# Patient Record
Sex: Female | Born: 1970 | Race: Black or African American | Hispanic: No | Smoking: Never smoker
Health system: Southern US, Community
[De-identification: ages and names within clinical notes are randomized; demographics above are authoritative.]

## PROBLEM LIST (undated history)

## (undated) DIAGNOSIS — T7840XA Allergy, unspecified, initial encounter: Secondary | ICD-10-CM

## (undated) DIAGNOSIS — B009 Herpesviral infection, unspecified: Secondary | ICD-10-CM

## (undated) DIAGNOSIS — I10 Essential (primary) hypertension: Secondary | ICD-10-CM

## (undated) DIAGNOSIS — E7800 Pure hypercholesterolemia, unspecified: Secondary | ICD-10-CM

## (undated) DIAGNOSIS — E669 Obesity, unspecified: Secondary | ICD-10-CM

## (undated) HISTORY — DX: Obesity, unspecified: E66.9

## (undated) HISTORY — PX: WISDOM TOOTH EXTRACTION: SHX21

## (undated) HISTORY — DX: Allergy, unspecified, initial encounter: T78.40XA

## (undated) HISTORY — DX: Herpesviral infection, unspecified: B00.9

## (undated) HISTORY — DX: Pure hypercholesterolemia, unspecified: E78.00

---

## 1990-06-22 DIAGNOSIS — B009 Herpesviral infection, unspecified: Secondary | ICD-10-CM

## 1990-06-22 HISTORY — DX: Herpesviral infection, unspecified: B00.9

## 2006-01-18 ENCOUNTER — Emergency Department: Payer: Self-pay | Admitting: Emergency Medicine

## 2015-06-04 LAB — HM PAP SMEAR: HM PAP: NORMAL

## 2015-06-04 LAB — HM MAMMOGRAPHY

## 2015-06-24 ENCOUNTER — Ambulatory Visit (INDEPENDENT_AMBULATORY_CARE_PROVIDER_SITE_OTHER): Payer: Self-pay | Admitting: Family Medicine

## 2015-06-24 ENCOUNTER — Encounter: Payer: Self-pay | Admitting: Family Medicine

## 2015-06-24 VITALS — BP 122/78 | HR 59 | Temp 98.4°F | Resp 16 | Ht 62.0 in | Wt 197.0 lb

## 2015-06-24 DIAGNOSIS — J301 Allergic rhinitis due to pollen: Secondary | ICD-10-CM

## 2015-06-24 DIAGNOSIS — H66001 Acute suppurative otitis media without spontaneous rupture of ear drum, right ear: Secondary | ICD-10-CM

## 2015-06-24 DIAGNOSIS — H6690 Otitis media, unspecified, unspecified ear: Secondary | ICD-10-CM | POA: Insufficient documentation

## 2015-06-24 DIAGNOSIS — J309 Allergic rhinitis, unspecified: Secondary | ICD-10-CM | POA: Insufficient documentation

## 2015-06-24 MED ORDER — AMOXICILLIN 500 MG PO CAPS: 500.0000 mg | ORAL_CAPSULE | Freq: Two times a day (BID) | ORAL | Status: DC

## 2015-06-24 MED ORDER — FLUTICASONE PROPIONATE 50 MCG/ACT NA SUSP: 2.0000 | Freq: Every day | NASAL | Status: DC

## 2015-06-24 NOTE — Progress Notes (Signed)
Subjective:    Patient ID: Cassidy Kelly, female    DOB: 21-Jul-1970, 45 y.o.   MRN: [id number]  HPI: Cassidy Kelly is a 45 y.o. female presenting on 06/24/2015 for Ear Pain   HPI  Pt presents to establish care today but for Sick visit. Previous care provider was [facility]- Dr. Elease Hashimoto.  It has been 5 years since Her last PCP visit. Records from previous provider will be requested and reviewed. She is a Pension scheme manager, working at an [employer] school currently. Current medical problems include:  Sees GYN- [facility] for well woman exams.   Patient presents with right ear pain. Two weeks ago, she felt a knot behind her right ear. She did not check it again until yesterday, when she noticed it was gone. Two weeks ago, she was also exposed to a cat, which she is allergic to. She had swollen, itchy eyes and congestion that lasted two days. She did not have rhinorrhea, facial pain, sore throat, cough, SOB, or chest tightness. She used lavender, lemon, and peppermint oils to help her symptoms. She did not try any OTC products.  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 Nothing seems to trigger it. No history of trauma. It goes away with popping. No hearing loss. Two days ago, she felt warm and had a t-max of 100.2. She noticed mild right ear pain. No chills, nausea, or vomiting.   Health maintenance:  Flu - declines Sees GYN- [facility]- Paps and mammogram UTD- done last year. Currently using depo for birth control.   Past Medical History  Diagnosis Date  . Allergy    Social History   Social History  . Marital Status: Married    Spouse Name: N/A  . Number of Children: N/A  . Years of Education: N/A   Occupational History  . Not on file.   Social History Main Topics  . Smoking status: Never Smoker   . Smokeless tobacco: Not on file  . Alcohol Use: Not on file  . Drug Use: Not on file  . Sexual Activity: Not on file     Other Topics Concern  . Not on file   Social History Narrative  . No narrative on file     No current outpatient prescriptions on file prior to visit.   No current facility-administered medications on file prior to visit.    Review of Systems  Constitutional: Positive for fever. Negative for activity change.  HENT: Positive for congestion, ear pain, sneezing and tinnitus. Negative for ear discharge, hearing loss, postnasal drip, rhinorrhea, sinus pressure and sore throat.   Eyes: Positive for itching. Negative for visual disturbance.  Respiratory: Negative for cough, chest tightness, shortness of breath and wheezing.   Cardiovascular: Negative for chest pain.  Gastrointestinal: Negative for nausea and vomiting.  Genitourinary: Negative for dysuria.  Musculoskeletal: Negative for myalgias and arthralgias.  Skin: Negative for color change.  Allergic/Immunologic: Positive for environmental allergies.  Neurological: Negative for dizziness, weakness and light-headedness.  Hematological: Positive for adenopathy.  Psychiatric/Behavioral: Negative for behavioral problems and agitation.   Per HPI unless specifically indicated above     Objective:    BP 122/78 mmHg  Pulse 59  Temp(Src) 98.4 F (36.9 C) (Oral)  Resp 16  Ht  (1.575 m)  Wt 197 lb (89.359 kg)  BMI 36.02 kg/m2  SpO2 100%  Wt Readings from Last 3 Encounters:  06/24/15 197 lb (89.359 kg)  Physical Exam  Constitutional: She is oriented to person, place, and time. She appears well-developed and well-nourished.  HENT:  Head: Normocephalic and atraumatic.  Right Ear: Hearing normal. No lacerations. There is swelling. No drainage or tenderness. No foreign bodies. Tympanic membrane is not erythematous and not bulging. A middle ear effusion is present. No decreased hearing is noted.  Left Ear: Hearing normal. No lacerations. No drainage, swelling or tenderness. No foreign bodies.  No middle ear effusion. No  decreased hearing is noted.  Nose: Mucosal edema present. Right sinus exhibits no maxillary sinus tenderness and no frontal sinus tenderness. Left sinus exhibits no maxillary sinus tenderness and no frontal sinus tenderness.  Mouth/Throat: Oropharynx is clear and moist. No oropharyngeal exudate, posterior oropharyngeal edema or posterior oropharyngeal erythema.  Weber test showed no lateralization. Rinne test showed air conduction < bone conduction bilaterally.  Dull opaque TM with cloudy fluid. Poor landmarks.    Eyes: EOM are normal.  Neck: Normal range of motion.  Cardiovascular: Normal rate, regular rhythm, normal heart sounds and normal pulses.   Pulmonary/Chest: Effort normal and breath sounds normal. No respiratory distress. She has no wheezes.  Musculoskeletal: Normal range of motion.  Lymphadenopathy:       Head (right side): No submental, no submandibular, no tonsillar, no preauricular, no posterior auricular and no occipital adenopathy present.       Head (left side): No submental, no submandibular, no tonsillar, no preauricular, no posterior auricular and no occipital adenopathy present.    She has no cervical adenopathy.       Right: No supraclavicular adenopathy present.       Left: No supraclavicular adenopathy present.  Neurological: She is alert and oriented to person, place, and time.  Skin: Skin is warm and dry.  Psychiatric: She has a normal mood and affect. Her speech is normal and behavior is normal. Thought content normal.   No results found for this or any previous visit.    Assessment & Plan:   Problem List Items Addressed This Visit      Respiratory   Allergic rhinitis    Pt would prefer to treat with essential oils. However will try shorterm flonase to help with current nasal symptoms and ear congestion. Encouraged use of complementary therapies with nasal steroids.        Relevant Medications   fluticasone (FLONASE) 50 MCG/ACT nasal spray     Nervous  and Auditory   AOM (acute otitis media) - Primary    Treat for AOM due to dull opaque membrane and fever. Amoxil BID for 5 days. Return if symptoms not improving.       Relevant Medications   amoxicillin (AMOXIL) 500 MG capsule      Meds ordered this encounter  Medications  . medroxyPROGESTERone (DEPO-PROVERA) 150 MG/ML injection    Sig: INJECT 1 MILLILITER INTRAMUSCULARLY EVERY 3 MONTHS as directed by prescriber    Refill:  0  . amoxicillin (AMOXIL) 500 MG capsule    Sig: Take 1 capsule (500 mg total) by mouth 2 (two) times daily.    Dispense:  10 capsule    Refill:  0    Order Specific Question:  Supervising Provider    Answer:  Janeann Forehand [phone]  . fluticasone (FLONASE) 50 MCG/ACT nasal spray    Sig: Place 2 sprays into both nostrils daily.    Dispense:  16 g    Refill:  3    Order Specific Question:  Supervising Provider  Answer:  Janeann ForehandHAWKINS JR, JAMES H [[id number]]      Follow up plan: Return in about 4 weeks (around 07/22/2015) for Preventative health exam.

## 2015-06-24 NOTE — Patient Instructions (Signed)

## 2015-06-24 NOTE — Assessment & Plan Note (Signed)
Pt would prefer to treat with essential oils. However will try shorterm flonase to help with current nasal symptoms and ear congestion. Encouraged use of complementary therapies with nasal steroids.

## 2015-06-24 NOTE — Assessment & Plan Note (Signed)
Treat for AOM due to dull opaque membrane and fever. Amoxil BID for 5 days. Return if symptoms not improving.

## 2015-10-11 ENCOUNTER — Ambulatory Visit (INDEPENDENT_AMBULATORY_CARE_PROVIDER_SITE_OTHER): Payer: BC Managed Care – PPO | Admitting: Family Medicine

## 2015-10-11 ENCOUNTER — Encounter: Payer: Self-pay | Admitting: Family Medicine

## 2015-10-11 VITALS — BP 124/76 | HR 66 | Temp 98.7°F | Resp 16 | Ht 62.0 in | Wt 204.0 lb

## 2015-10-11 DIAGNOSIS — Z0000 Encounter for general adult medical examination without abnormal findings: Secondary | ICD-10-CM | POA: Diagnosis not present

## 2015-10-11 DIAGNOSIS — Z6837 Body mass index (BMI) 37.0-37.9, adult: Secondary | ICD-10-CM | POA: Diagnosis not present

## 2015-10-11 LAB — COMPLETE METABOLIC PANEL WITH GFR
ALT: 16 U/L (ref 6–29)
AST: 17 U/L (ref 10–35)
Albumin: 4.5 g/dL (ref 3.6–5.1)
Alkaline Phosphatase: 68 U/L (ref 33–115)
BILIRUBIN TOTAL: 0.5 mg/dL (ref 0.2–1.2)
BUN: 7 mg/dL (ref 7–25)
CO2: 22 mmol/L (ref 20–31)
CREATININE: 0.94 mg/dL (ref 0.50–1.10)
Calcium: 8.9 mg/dL (ref 8.6–10.2)
Chloride: 104 mmol/L (ref 98–110)
GFR, EST AFRICAN AMERICAN: 84 mL/min (ref >=60)
GFR, Est Non African American: 73 mL/min (ref >=60)
GLUCOSE: 98 mg/dL (ref 65–99)
Potassium: 4 mmol/L (ref 3.5–5.3)
SODIUM: 139 mmol/L (ref 135–146)
TOTAL PROTEIN: 7.3 g/dL (ref 6.1–8.1)

## 2015-10-11 LAB — LIPID PANEL
Cholesterol: 222 mg/dL — ABNORMAL HIGH (ref 125–200)
HDL: 56 mg/dL (ref >=46)
LDL CALC: 129 mg/dL (ref <130)
Total CHOL/HDL Ratio: 4 Ratio (ref <=5.0)
Triglycerides: 184 mg/dL — ABNORMAL HIGH (ref <150)
VLDL: 37 mg/dL — AB (ref <30)

## 2015-10-11 NOTE — Progress Notes (Signed)
Subjective:    Patient ID: Cassidy Kelly, female    DOB: [birth year], 45 y.o.   MRN: [id number]  HPI: Cassidy Kelly is a 45 y.o. female presenting on 10/11/2015 for Annual Exam   HPI  Pt prevents for wellness visit. Sees OB yearly for mammograms and pap. Last pap March 2017. Normal. Mammogram normal.  TDAP: Unknown- probably in the last 10 years. Maybe 2013.  Is starting a exercise program. Walking now a few times a week. Adding ankles. Does work out Engineer, [employer]. Feels right now she is not eating like she should. Is trying to eat better. She and husband are doing a weight loss challenge.  Needs health maintenance labs.  Past Medical History  Diagnosis Date  . Allergy    Social History   Social History  . Marital Status: Married    Spouse Name: N/A  . Number of Children: N/A  . Years of Education: N/A   Occupational History  . Not on file.   Social History Main Topics  . Smoking status: Never Smoker   . Smokeless tobacco: Not on file  . Alcohol Use: Not on file  . Drug Use: Not on file  . Sexual Activity: Not on file   Other Topics Concern  . Not on file   Social History Narrative   Family History  Problem Relation Age of Onset  . Healthy Mother    Current Outpatient Prescriptions on File Prior to Visit  Medication Sig  . medroxyPROGESTERone (DEPO-PROVERA) 150 MG/ML injection INJECT 1 MILLILITER INTRAMUSCULARLY EVERY 3 MONTHS as directed by prescriber   No current facility-administered medications on file prior to visit.    Review of Systems  Constitutional: Negative for fever and chills.  HENT: Negative.   Respiratory: Negative for cough, chest tightness and wheezing.   Cardiovascular: Negative for chest pain and leg swelling.  Gastrointestinal: Negative for nausea, vomiting, abdominal pain, diarrhea and constipation.  Endocrine: Negative.  Negative for cold intolerance, heat intolerance, polydipsia, polyphagia and polyuria.  Genitourinary:  Negative for dysuria and difficulty urinating.  Musculoskeletal: Negative.   Neurological: Negative for dizziness, light-headedness and numbness.  Psychiatric/Behavioral: Negative.    Per HPI unless specifically indicated above     Objective:    BP 124/76 mmHg  Pulse 66  Temp(Src) 98.7 F (37.1 C) (Oral)  Resp 16  Ht 5\' 2"  (1.575 m)  Wt 204 lb (92.534 kg)  BMI 37.30 kg/m2  LMP   Wt Readings from Last 3 Encounters:  10/11/15 204 lb (92.534 kg)  06/24/15 197 lb (89.359 kg)    Physical Exam  Constitutional: She is oriented to person, place, and time. She appears well-developed and well-nourished.  HENT:  Head: Normocephalic and atraumatic.  Neck: Neck supple.  Cardiovascular: Normal rate, regular rhythm and normal heart sounds.  Exam reveals no gallop and no friction rub.   No murmur heard. Pulmonary/Chest: Effort normal and breath sounds normal. She has no wheezes. She exhibits no tenderness.  Abdominal: Soft. Normal appearance and bowel sounds are normal. She exhibits no distension and no mass. There is no tenderness. There is no rebound and no guarding.  Musculoskeletal: Normal range of motion. She exhibits no edema or tenderness.  Lymphadenopathy:    She has no cervical adenopathy.  Neurological: She is alert and oriented to person, place, and time.  Skin: Skin is warm and dry.   Results for orders placed or performed in visit on 10/11/15  HM PAP SMEAR  Result Value Ref Range  HM Pap smear normal       Assessment & Plan:   Problem List Items Addressed This Visit      Other   BMI 37.0-37.9, adult    Pt has weight loss goals. Reviewed tips to help with weight loss. Recommend 1800 cal diet and modified low carb. Referred pt to choose [email or web address] to help with lifestyle choices.        Other Visit Diagnoses    Preventative health care    -  Primary    Reviewed health maintenance. Pt is UTD on health maintenance. Reviewed healthy lifestyle recommendations.      Relevant Orders    COMPLETE METABOLIC PANEL WITH GFR    Lipid Profile       No orders of the defined types were placed in this encounter.      Follow up plan: Return in about 1 year (around 10/10/2016), or if symptoms worsen or fail to improve.

## 2015-10-11 NOTE — Assessment & Plan Note (Signed)
Pt has weight loss goals. Reviewed tips to help with weight loss. Recommend 1800 cal diet and modified low carb. Referred pt to choose [email or web address] to help with lifestyle choices.

## 2015-10-11 NOTE — Patient Instructions (Signed)
Health Maintenance, Female Adopting a healthy lifestyle and getting preventive care can go a long way to promote health and wellness. Talk with your health care provider about what schedule of regular examinations is right for you. This is a good chance for you to check in with your provider about disease prevention and staying healthy. In between checkups, there are plenty of things you can do on your own. Experts have done a lot of research about which lifestyle changes and preventive measures are most likely to keep you healthy. Ask your health care provider for more information. WEIGHT AND DIET  Eat a healthy diet  Be sure to include plenty of vegetables, fruits, low-fat dairy products, and lean protein.  Do not eat a lot of foods high in solid fats, added sugars, or salt.  Get regular exercise. This is one of the most important things you can do for your health.  Most adults should exercise for at least 150 minutes each week. The exercise should increase your heart rate and make you sweat (moderate-intensity exercise).  Most adults should also do strengthening exercises at least twice a week. This is in addition to the moderate-intensity exercise.  Maintain a healthy weight  Body mass index (BMI) is a measurement that can be used to identify possible weight problems. It estimates body fat based on height and weight. Your health care provider can help determine your BMI and help you achieve or maintain a healthy weight.  For females 20 years of age and older:   A BMI below 18.5 is considered underweight.  A BMI of 18.5 to 24.9 is normal.  A BMI of 25 to 29.9 is considered overweight.  A BMI of 30 and above is considered obese.  Watch levels of cholesterol and blood lipids  You should start having your blood tested for lipids and cholesterol at 47 years of age, then have this test every 5 years.  You may need to have your cholesterol levels checked more often if:  Your lipid  or cholesterol levels are high.  You are older than 47 years of age.  You are at high risk for heart disease.  CANCER SCREENING   Lung Cancer  Lung cancer screening is recommended for adults 55-80 years old who are at high risk for lung cancer because of a history of smoking.  A yearly low-dose CT scan of the lungs is recommended for people who:  Currently smoke.  Have quit within the past 15 years.  Have at least a 30-pack-year history of smoking. A pack year is smoking an average of one pack of cigarettes a day for 1 year.  Yearly screening should continue until it has been 15 years since you quit.  Yearly screening should stop if you develop a health problem that would prevent you from having lung cancer treatment.  Breast Cancer  Practice breast self-awareness. This means understanding how your breasts normally appear and feel.  It also means doing regular breast self-exams. Let your health care provider know about any changes, no matter how small.  If you are in your 20s or 30s, you should have a clinical breast exam (CBE) by a health care provider every 1-3 years as part of a regular health exam.  If you are 40 or older, have a CBE every year. Also consider having a breast X-ray (mammogram) every year.  If you have a family history of breast cancer, talk to your health care provider about genetic screening.  If you   are at high risk for breast cancer, talk to your health care provider about having an MRI and a mammogram every year.  Breast cancer gene (BRCA) assessment is recommended for women who have family members with BRCA-related cancers. BRCA-related cancers include:  Breast.  Ovarian.  Tubal.  Peritoneal cancers.  Results of the assessment will determine the need for genetic counseling and BRCA1 and BRCA2 testing. Cervical Cancer Your health care provider may recommend that you be screened regularly for cancer of the pelvic organs (ovaries, uterus, and  vagina). This screening involves a pelvic examination, including checking for microscopic changes to the surface of your cervix (Pap test). You may be encouraged to have this screening done every 3 years, beginning at age 21.  For women ages 30-65, health care providers may recommend pelvic exams and Pap testing every 3 years, or they may recommend the Pap and pelvic exam, combined with testing for human papilloma virus (HPV), every 5 years. Some types of HPV increase your risk of cervical cancer. Testing for HPV may also be done on women of any age with unclear Pap test results.  Other health care providers may not recommend any screening for nonpregnant women who are considered low risk for pelvic cancer and who do not have symptoms. Ask your health care provider if a screening pelvic exam is right for you.  If you have had past treatment for cervical cancer or a condition that could lead to cancer, you need Pap tests and screening for cancer for at least 20 years after your treatment. If Pap tests have been discontinued, your risk factors (such as having a new sexual partner) need to be reassessed to determine if screening should resume. Some women have medical problems that increase the chance of getting cervical cancer. In these cases, your health care provider may recommend more frequent screening and Pap tests. Colorectal Cancer  This type of cancer can be detected and often prevented.  Routine colorectal cancer screening usually begins at 47 years of age and continues through 47 years of age.  Your health care provider may recommend screening at an earlier age if you have risk factors for colon cancer.  Your health care provider may also recommend using home test kits to check for hidden blood in the stool.  A small camera at the end of a tube can be used to examine your colon directly (sigmoidoscopy or colonoscopy). This is done to check for the earliest forms of colorectal  cancer.  Routine screening usually begins at age 50.  Direct examination of the colon should be repeated every 5-10 years through 47 years of age. However, you may need to be screened more often if early forms of precancerous polyps or small growths are found. Skin Cancer  Check your skin from head to toe regularly.  Tell your health care provider about any new moles or changes in moles, especially if there is a change in a mole's shape or color.  Also tell your health care provider if you have a mole that is larger than the size of a pencil eraser.  Always use sunscreen. Apply sunscreen liberally and repeatedly throughout the day.  Protect yourself by wearing long sleeves, pants, a wide-brimmed hat, and sunglasses whenever you are outside. HEART DISEASE, DIABETES, AND HIGH BLOOD PRESSURE   High blood pressure causes heart disease and increases the risk of stroke. High blood pressure is more likely to develop in:  People who have blood pressure in the high end   of the normal range (130-139/85-89 mm Hg).  People who are overweight or obese.  People who are African American.  If you are 38-23 years of age, have your blood pressure checked every 3-5 years. If you are 61 years of age or older, have your blood pressure checked every year. You should have your blood pressure measured twice--once when you are at a hospital or clinic, and once when you are not at a hospital or clinic. Record the average of the two measurements. To check your blood pressure when you are not at a hospital or clinic, you can use:  An automated blood pressure machine at a pharmacy.  A home blood pressure monitor.  If you are between 45 years and 39 years old, ask your health care provider if you should take aspirin to prevent strokes.  Have regular diabetes screenings. This involves taking a blood sample to check your fasting blood sugar level.  If you are at a normal weight and have a low risk for diabetes,  have this test once every three years after 47 years of age.  If you are overweight and have a high risk for diabetes, consider being tested at a younger age or more often. PREVENTING INFECTION  Hepatitis B  If you have a higher risk for hepatitis B, you should be screened for this virus. You are considered at high risk for hepatitis B if:  You were born in a country where hepatitis B is common. Ask your health care provider which countries are considered high risk.  Your parents were born in a high-risk country, and you have not been immunized against hepatitis B (hepatitis B vaccine).  You have HIV or AIDS.  You use needles to inject street drugs.  You live with someone who has hepatitis B.  You have had sex with someone who has hepatitis B.  You get hemodialysis treatment.  You take certain medicines for conditions, including cancer, organ transplantation, and autoimmune conditions. Hepatitis C  Blood testing is recommended for:  Everyone born from [birth year] through 1967.  Anyone with known risk factors for hepatitis C. Sexually transmitted infections (STIs)  You should be screened for sexually transmitted infections (STIs) including gonorrhea and chlamydia if:  You are sexually active and are younger than 47 years of age.  You are older than 47 years of age and your health care provider tells you that you are at risk for this type of infection.  Your sexual activity has changed since you were last screened and you are at an increased risk for chlamydia or gonorrhea. Ask your health care provider if you are at risk.  If you do not have HIV, but are at risk, it may be recommended that you take a prescription medicine daily to prevent HIV infection. This is called pre-exposure prophylaxis (PrEP). You are considered at risk if:  You are sexually active and do not regularly use condoms or know the HIV status of your partner(s).  You take drugs by injection.  You are sexually  active with a partner who has HIV. Talk with your health care provider about whether you are at high risk of being infected with HIV. If you choose to begin PrEP, you should first be tested for HIV. You should then be tested every 3 months for as long as you are taking PrEP.  PREGNANCY   If you are premenopausal and you may become pregnant, ask your health care provider about preconception counseling.  If you may  become pregnant, take 400 to 800 micrograms (mcg) of folic acid every day.  If you want to prevent pregnancy, talk to your health care provider about birth control (contraception). OSTEOPOROSIS AND MENOPAUSE   Osteoporosis is a disease in which the bones lose minerals and strength with aging. This can result in serious bone fractures. Your risk for osteoporosis can be identified using a bone density scan.  If you are 61 years of age or older, or if you are at risk for osteoporosis and fractures, ask your health care provider if you should be screened.  Ask your health care provider whether you should take a calcium or vitamin D supplement to lower your risk for osteoporosis.  Menopause may have certain physical symptoms and risks.  Hormone replacement therapy may reduce some of these symptoms and risks. Talk to your health care provider about whether hormone replacement therapy is right for you.  HOME CARE INSTRUCTIONS   Schedule regular health, dental, and eye exams.  Stay current with your immunizations.   Do not use any tobacco products including cigarettes, chewing tobacco, or electronic cigarettes.  If you are pregnant, do not drink alcohol.  If you are breastfeeding, limit how much and how often you drink alcohol.  Limit alcohol intake to no more than 1 drink per day for nonpregnant women. One drink equals 12 ounces of beer, 5 ounces of wine, or 1 ounces of hard liquor.  Do not use street drugs.  Do not share needles.  Ask your health care provider for help if  you need support or information about quitting drugs.  Tell your health care provider if you often feel depressed.  Tell your health care provider if you have ever been abused or do not feel safe at home.   This information is not intended to replace advice given to you by your health care provider. Make sure you discuss any questions you have with your health care provider.   Document Released: 09/26/2010 Document Revised: 04/03/2014 Document Reviewed: 02/12/2013 Elsevier Interactive Patient Education [employer].

## 2016-06-30 ENCOUNTER — Ambulatory Visit (INDEPENDENT_AMBULATORY_CARE_PROVIDER_SITE_OTHER): Payer: BC Managed Care – PPO | Admitting: Certified Nurse Midwife

## 2016-06-30 ENCOUNTER — Encounter: Payer: Self-pay | Admitting: Certified Nurse Midwife

## 2016-06-30 VITALS — BP 130/80 | HR 55 | Ht 62.0 in | Wt 202.0 lb

## 2016-06-30 DIAGNOSIS — Z124 Encounter for screening for malignant neoplasm of cervix: Secondary | ICD-10-CM | POA: Diagnosis not present

## 2016-06-30 DIAGNOSIS — Z01419 Encounter for gynecological examination (general) (routine) without abnormal findings: Secondary | ICD-10-CM

## 2016-06-30 MED ORDER — MEDROXYPROGESTERONE ACETATE 150 MG/ML IM SUSP: 150.0000 mg | INTRAMUSCULAR | 3 refills | Status: DC

## 2016-06-30 MED ORDER — MEDROXYPROGESTERONE ACETATE 150 MG/ML IM SUSP
150.0000 mg | INTRAMUSCULAR | 3 refills | Status: DC
Start: 2016-06-30 — End: 2016-06-30

## 2016-06-30 NOTE — Progress Notes (Signed)
Gynecology Annual Exam  PCP: System, Pcp Not In  Chief Complaint:  Chief Complaint  Patient presents with  . Gynecologic Exam    NEEDS MAMMOGRAM ORDERED    History of Present Illness Ms. Sharronda Antony Blackbird is a 46 y.o. G2P1011 BF who who presents today for her annual examination.  Her menses are absent due to Depo Provera. Occasionally will have pink spotting prior to the next injection of Depo.  She does not have vasomotor sx.   She is married and is sexually active.  Contraception: Depo. Last dose 04/17/2016  Last Pap: June 04, 2015  Results were: NIL /neg HPV DNA.  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 Last mammogram: June 04, 2015  Results were: Birads 2, stable and benign. There is no FH of breast cancer. There is no FH of ovarian cancer. The patient does do self-breast exams.   Tobacco use: The patient denies current or previous tobacco use. Alcohol use: occasional glass of wine Exercise: does hot yoga 4x/week  She does get adequate calcium  in her diet. Her last cholesterol screen last year was borderline elevated. PCP is Amy Krebs at [facility]   Review of Systems: Review of Systems  Constitutional: Negative for chills, fever and weight loss.  HENT: Negative for congestion, sinus pain and sore throat.   Eyes: Negative for blurred vision and pain.  Respiratory: Negative for hemoptysis, shortness of breath and wheezing.   Cardiovascular: Negative for chest pain, palpitations and leg swelling.  Gastrointestinal: Negative for abdominal pain, blood in stool, diarrhea, heartburn, nausea and vomiting.  Genitourinary: Negative for dysuria, frequency, hematuria and urgency.  Musculoskeletal: Negative for back pain, joint pain and myalgias.  Skin: Negative for itching and rash.  Neurological: Negative for dizziness, tingling and headaches.  Endo/Heme/Allergies: Negative for environmental allergies and polydipsia. Does not bruise/bleed  easily.       Negative for hirsutism   Psychiatric/Behavioral: Negative for depression. The patient is not nervous/anxious and does not have insomnia.      Past Medical History:  Past Medical History:  Diagnosis Date  . Allergy   . 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 Hypercholesterolemia     Past Surgical History:  Past Surgical History:  Procedure Laterality Date  . WISDOM TOOTH EXTRACTION     AGE 73; ALL FOUR    Medications: Prior to Admission medications   Medication Sig Start Date End Date Taking? Authorizing Provider  medroxyPROGESTERone (DEPO-PROVERA) 150 MG/ML injection Inject 150 mg into the muscle every 3 (three) months.   Yes Historical Provider, MD  Multiple Vitamin (MULTIVITAMIN) LIQD Take 5 mLs by mouth daily. Uses essential oil Lifelong Vitality vitamins   Yes Historical Provider, MD    Allergies:  No Known Allergies   Obstetric History:  OB History  Gravida Para Term Preterm AB Living  SAB TAB Ectopic Multiple Live Births  1       1    # Outcome Date GA Lbr Len/2nd Weight Sex Delivery Anes PTL Lv  2 Term 09/16/89   7 lb 6 oz (3.345 kg) M Vag-Spont   LIV  1 SAB               Social History:  Social History   Social History  . Marital status: Married    Spouse name: N/A  . Number of children: 1  . Years of education: 22   Occupational History  .  Special Education Teacher    Social History Main Topics  . Smoking status: Never Smoker  . Smokeless tobacco: Never Used  . Alcohol use Yes     Comment: OCC  . Drug use: No  . Sexual activity: Yes    Partners: Male    Birth control/ protection: Injection     Comment: DEPO   Other Topics Concern  . Not on file   Social History Narrative  . No narrative on file    Family History:  Family History  Problem Relation Age of Onset  . Healthy Mother   . Diabetes Maternal Grandmother   . Heart disease Paternal Grandfather        MIs in 53s     Physical Exam Vitals:BP 130/80 (Patient  Position: Sitting)   Pulse (!) 55   Ht  (1.575 m)   Wt 202 lb (91.6 kg)   LMP  (LMP Unknown) Comment: ON DEPO  BMI 36.95 kg/m     Physical Exam  Constitutional: She is oriented to person, place, and time. She appears well-developed and well-nourished. No distress.  HENT:  Head: Normocephalic and atraumatic.  Neck: Normal range of motion. No thyromegaly present.  Cardiovascular: Normal rate and regular rhythm.   No murmur heard. Respiratory: Effort normal and breath sounds normal.  Breast: symmetrical, no nipple or skin changes, no masses, NT. No infraclavicular, supraclavicular or axillary LAN  GI: Soft. Bowel sounds are normal. She exhibits no mass. There is no tenderness.  Genitourinary:  Genitourinary Comments: Vulva: no lesions or inflammation Vagina: no lesions or masses Cervix: no masses, NT Uterus: RFd, NSSC, NT Adnexa: no masses, NT  Musculoskeletal: Normal range of motion.  Lymphadenopathy:    She has no cervical adenopathy.  Neurological: She is alert and oriented to person, place, and time.  Skin: Skin is warm and dry.  Psychiatric: She has a normal mood and affect.     Assessment: 46 y.o. G2P0011 well woman exam  Plan:  1) Contraception: Depo  2) Cervical cancer screening:  Pap done  3) Routine healthcare maintenance including cholesterol, diabetes screening managed by PCP  4) Breast cancer screening: continue monthly SBE and annual screening mammograms. Patient to schedule mammogram at [facility].  5) Follow up 1 year for routine annual exam   Farrel Conners, CNM

## 2016-07-03 ENCOUNTER — Ambulatory Visit: Payer: Self-pay | Admitting: Certified Nurse Midwife

## 2016-07-03 LAB — IGP,RFX APTIMA HPV ALL PTH: PAP Smear Comment: 0

## 2016-07-14 ENCOUNTER — Ambulatory Visit (INDEPENDENT_AMBULATORY_CARE_PROVIDER_SITE_OTHER): Payer: BC Managed Care – PPO

## 2016-07-14 DIAGNOSIS — Z3042 Encounter for surveillance of injectable contraceptive: Secondary | ICD-10-CM

## 2016-07-14 MED ORDER — MEDROXYPROGESTERONE ACETATE 150 MG/ML IM SUSP
150.0000 mg | Freq: Once | INTRAMUSCULAR | Status: AC
  Administered 2016-07-14: 150 mg via INTRAMUSCULAR

## 2016-11-26 ENCOUNTER — Encounter: Payer: Self-pay | Admitting: Certified Nurse Midwife

## 2016-12-20 ENCOUNTER — Ambulatory Visit (INDEPENDENT_AMBULATORY_CARE_PROVIDER_SITE_OTHER): Payer: BC Managed Care – PPO

## 2016-12-20 DIAGNOSIS — Z3202 Encounter for pregnancy test, result negative: Secondary | ICD-10-CM

## 2016-12-20 DIAGNOSIS — Z3042 Encounter for surveillance of injectable contraceptive: Secondary | ICD-10-CM | POA: Diagnosis not present

## 2016-12-20 DIAGNOSIS — N912 Amenorrhea, unspecified: Secondary | ICD-10-CM | POA: Diagnosis not present

## 2016-12-20 LAB — POCT URINE PREGNANCY: Preg Test, Ur: NEGATIVE

## 2016-12-20 MED ORDER — MEDROXYPROGESTERONE ACETATE 150 MG/ML IM SUSP
150.0000 mg | Freq: Once | INTRAMUSCULAR | Status: AC
  Administered 2016-12-20: 150 mg via INTRAMUSCULAR

## 2017-03-16 ENCOUNTER — Ambulatory Visit (INDEPENDENT_AMBULATORY_CARE_PROVIDER_SITE_OTHER): Payer: BC Managed Care – PPO

## 2017-03-16 DIAGNOSIS — Z3042 Encounter for surveillance of injectable contraceptive: Secondary | ICD-10-CM

## 2017-03-16 MED ORDER — MEDROXYPROGESTERONE ACETATE 150 MG/ML IM SUSP
150.0000 mg | Freq: Once | INTRAMUSCULAR | Status: AC
  Administered 2017-03-16: 150 mg via INTRAMUSCULAR

## 2017-06-08 ENCOUNTER — Ambulatory Visit (INDEPENDENT_AMBULATORY_CARE_PROVIDER_SITE_OTHER): Payer: BC Managed Care – PPO

## 2017-06-08 DIAGNOSIS — Z3042 Encounter for surveillance of injectable contraceptive: Secondary | ICD-10-CM

## 2017-06-08 MED ORDER — MEDROXYPROGESTERONE ACETATE 150 MG/ML IM SUSP
150.0000 mg | Freq: Once | INTRAMUSCULAR | Status: AC
  Administered 2017-06-08: 150 mg via INTRAMUSCULAR

## 2017-06-14 ENCOUNTER — Encounter: Payer: Self-pay | Admitting: Emergency Medicine

## 2017-06-14 ENCOUNTER — Emergency Department
Admission: EM | Admit: 2017-06-14 | Discharge: 2017-06-14 | Disposition: A | Discharge status: Home / Self Care | Payer: BC Managed Care – PPO | Attending: Emergency Medicine | Admitting: Emergency Medicine

## 2017-06-14 ENCOUNTER — Emergency Department: Payer: BC Managed Care – PPO

## 2017-06-14 DIAGNOSIS — R42 Dizziness and giddiness: Secondary | ICD-10-CM | POA: Diagnosis not present

## 2017-06-14 LAB — URINALYSIS, COMPLETE (UACMP) WITH MICROSCOPIC
BACTERIA UA: NONE SEEN
BILIRUBIN URINE: NEGATIVE
Glucose, UA: NEGATIVE mg/dL
KETONES UR: NEGATIVE mg/dL
LEUKOCYTES UA: NEGATIVE
Nitrite: NEGATIVE
PROTEIN: NEGATIVE mg/dL
Specific Gravity, Urine: 1.006 (ref 1.005–1.030)
pH: 5 (ref 5.0–8.0)

## 2017-06-14 LAB — CBC
HEMATOCRIT: 37.8 % (ref 35.0–47.0)
Hemoglobin: 12.7 g/dL (ref 12.0–16.0)
MCH: 30.6 pg (ref 26.0–34.0)
MCHC: 33.7 g/dL (ref 32.0–36.0)
MCV: 90.7 fL (ref 80.0–100.0)
Platelets: 215 10*3/uL (ref 150–440)
RBC: 4.17 MIL/uL (ref 3.80–5.20)
RDW: 13.6 % (ref 11.5–14.5)
WBC: 11.5 10*3/uL — AB (ref 3.6–11.0)

## 2017-06-14 LAB — BASIC METABOLIC PANEL
Anion gap: 10 (ref 5–15)
BUN: 12 mg/dL (ref 6–20)
CHLORIDE: 99 mmol/L — AB (ref 101–111)
CO2: 23 mmol/L (ref 22–32)
Calcium: 8.7 mg/dL — ABNORMAL LOW (ref 8.9–10.3)
Creatinine, Ser: 0.75 mg/dL (ref 0.44–1.00)
GFR calc Af Amer: >60 mL/min (ref >60)
GFR calc non Af Amer: >60 mL/min (ref >60)
GLUCOSE: 135 mg/dL — AB (ref 65–99)
POTASSIUM: 3.6 mmol/L (ref 3.5–5.1)
SODIUM: 132 mmol/L — AB (ref 135–145)

## 2017-06-14 MED ORDER — MECLIZINE HCL 25 MG PO TABS: 25.0000 mg | ORAL_TABLET | Freq: Three times a day (TID) | ORAL | 1 refills | Status: DC | PRN

## 2017-06-14 NOTE — ED Notes (Signed)
Patient reports she awoke with headache at 0300; pain started to decrease to dullness at 1430. Patient then reports sudden onset on dizziness/nausea, vertigo, and photosensitivity that has since resolved.   Patient reports continued dull headache at 2 of 10.

## 2017-06-14 NOTE — ED Notes (Signed)
FN: pt brought over from [facility] with headache since 3am.

## 2017-06-14 NOTE — ED Notes (Signed)
Reviewed discharge instructions, follow-up care, and prescriptions with patient. Patient verbalized understanding of all information reviewed. Patient stable, with no distress noted at this time.    

## 2017-06-14 NOTE — ED Provider Notes (Signed)
[facility] Emergency Department Provider Note       Time seen: ----------------------------------------- 8:43 PM on 06/14/2017 -----------------------------------------   I have reviewed the triage vital signs and the nursing notes.  HISTORY   Chief Complaint Dizziness    HPI Early Cassidy Kelly is a 47 y.o. female with a history of allergies, herpes and hypercholesterolemia who presents to the ED for dizziness, nausea and headache earlier in the day today.  Patient describes a sensation of room spinning.  She has never had this happen before.  She has no history of migraines.  Symptoms have currently resolved.  She denies any other neurologic complaint.  Past Medical History:  Diagnosis Date  . Allergy   . 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 Hypercholesterolemia     Patient Active Problem List   Diagnosis Date Noted  . BMI 37.0-37.9, adult 10/11/2015  . AOM (acute otitis media) 06/24/2015  . Allergic rhinitis 06/24/2015    Past Surgical History:  Procedure Laterality Date  . WISDOM TOOTH EXTRACTION     AGE 19; ALL FOUR    Allergies Patient has no known allergies.  Social History Social History   Tobacco Use  . Smoking status: Never Smoker  . Smokeless tobacco: Never Used  Substance Use Topics  . Alcohol use: Yes    Comment: OCC  . Drug use: No   Review of Systems Constitutional: Negative for fever. Cardiovascular: Negative for chest pain. Respiratory: Negative for shortness of breath. Gastrointestinal: Negative for abdominal pain, vomiting and diarrhea. Musculoskeletal: Negative for back pain. Skin: Negative for rash. Neurological: Negative for headaches, focal weakness or numbness.  Positive for room spinning sensation  All systems negative/normal/unremarkable except as stated in the HPI  ____________________________________________   PHYSICAL EXAM:  VITAL SIGNS: ED Triage Vitals  Enc Vitals Group     BP 06/14/17 1850 (!)  168/85     Pulse Rate 06/14/17 1850 71     Resp 06/14/17 1850 18     Temp 06/14/17 1850 98.1 F (36.7 C)     Temp Source 06/14/17 1850 Oral     SpO2 06/14/17 1850 100 %     Weight 06/14/17 1848 193 lb (87.5 kg)     Height 06/14/17 1848 5\' 2"  (1.575 m)     Head Circumference --      Peak Flow --      Pain Score 06/14/17 1848 3     Pain Loc --      Pain Edu? --      Excl. in GC? --    Constitutional: Alert and oriented. Well appearing and in no distress. Eyes: Conjunctivae are normal. Normal extraocular movements. ENT   Head: Normocephalic and atraumatic.   Nose: No congestion/rhinnorhea.   Mouth/Throat: Mucous membranes are moist.   Neck: No stridor. Cardiovascular: Normal rate, regular rhythm. No murmurs, rubs, or gallops. Respiratory: Normal respiratory effort without tachypnea nor retractions. Breath sounds are clear and equal bilaterally. No wheezes/rales/rhonchi. Gastrointestinal: Soft and nontender. Normal bowel sounds Musculoskeletal: Nontender with normal range of motion in extremities. No lower extremity tenderness nor edema. Neurologic:  Normal speech and language. No gross focal neurologic deficits are appreciated.  Strength, sensation, cranial nerves appear to be normal Skin:  Skin is warm, dry and intact. No rash noted. Psychiatric: Mood and affect are normal. Speech and behavior are normal.  ____________________________________________  EKG: Interpreted by me.  Normal sinus rhythm the rate of 71 bpm, normal PR interval, normal QRS, normal QT.  ____________________________________________  ED COURSE:  As part of my medical decision making, I reviewed the following data within the electronic MEDICAL RECORD NUMBER History obtained from family if available, nursing notes, old chart and ekg, as well as notes from prior ED visits. Patient presented for peripheral vertigo symptoms, we will assess with labs and imaging as indicated at this time.    Procedures ____________________________________________   LABS (pertinent positives/negatives)  Labs Reviewed  BASIC METABOLIC PANEL - Abnormal; Notable for the following components:      Result Value   Sodium 132 (*)    Chloride 99 (*)    Glucose, Bld 135 (*)    Calcium 8.7 (*)    All other components within normal limits  CBC - Abnormal; Notable for the following components:   WBC 11.5 (*)    All other components within normal limits  URINALYSIS, COMPLETE (UACMP) WITH MICROSCOPIC  CBG MONITORING, ED    RADIOLOGY Images were viewed by me  CT head is negative  ____________________________________________  DIFFERENTIAL DIAGNOSIS   Peripheral vertigo, migraine, tension headache, dehydration, electrolyte abnormality  FINAL ASSESSMENT AND PLAN  Vertigo   Plan: The patient had presented for vertiginous symptoms with some headache.  Both for symptoms have resolved by the time of my evaluation patient's labs are grossly unremarkable. Patient's imaging including CT imaging is normal.  She will be given medications for vertigo.  She also has sleep deprivation and she needs time off of work.  Otherwise she is cleared for outpatient follow-up.   Ulice Dash, MD   Note: This note was generated in part or whole with voice recognition software. Voice recognition is usually quite accurate but there are transcription errors that can and very often do occur. I apologize for any typographical errors that were not detected and corrected.     Emily Filbert, MD 06/14/17 2045

## 2017-06-14 NOTE — ED Triage Notes (Signed)
Pt comes into the ED via POV c/o dizziness, nausea, and a headache earlier today.  Patient denies having any migraines since she was last pregnant.  Patient explains that the room is spinning.  Patient in NAD at this time and is otherwise neurologically intact.

## 2017-07-11 ENCOUNTER — Ambulatory Visit: Payer: BC Managed Care – PPO | Admitting: Physician Assistant

## 2017-07-17 ENCOUNTER — Ambulatory Visit (INDEPENDENT_AMBULATORY_CARE_PROVIDER_SITE_OTHER): Payer: BC Managed Care – PPO | Admitting: Certified Nurse Midwife

## 2017-07-17 ENCOUNTER — Ambulatory Visit: Payer: BC Managed Care – PPO | Admitting: Physician Assistant

## 2017-07-17 ENCOUNTER — Encounter: Payer: Self-pay | Admitting: Certified Nurse Midwife

## 2017-07-17 VITALS — BP 142/82 | HR 68 | Ht 62.0 in | Wt 203.0 lb

## 2017-07-17 DIAGNOSIS — R42 Dizziness and giddiness: Secondary | ICD-10-CM | POA: Insufficient documentation

## 2017-07-17 DIAGNOSIS — R238 Other skin changes: Secondary | ICD-10-CM

## 2017-07-17 DIAGNOSIS — Z1211 Encounter for screening for malignant neoplasm of colon: Secondary | ICD-10-CM

## 2017-07-17 DIAGNOSIS — Z01419 Encounter for gynecological examination (general) (routine) without abnormal findings: Secondary | ICD-10-CM | POA: Diagnosis not present

## 2017-07-17 DIAGNOSIS — Z124 Encounter for screening for malignant neoplasm of cervix: Secondary | ICD-10-CM | POA: Diagnosis not present

## 2017-07-17 DIAGNOSIS — R233 Spontaneous ecchymoses: Secondary | ICD-10-CM

## 2017-07-17 MED ORDER — MEDROXYPROGESTERONE ACETATE 150 MG/ML IM SUSP: 150.0000 mg | INTRAMUSCULAR | 3 refills | Status: DC

## 2017-07-17 NOTE — Progress Notes (Signed)
Gynecology Annual Exam  PCP: System, Pcp Not In  Chief Complaint:  No chief complaint on file.   History of Present Illness Ms. Cassidy Kelly is a 47 y.o. G2P1011 BF who who presents today for her annual examination.  Her menses are absent due to Depo Provera. Occasionally will have pink spotting prior to the next injection of Depo.  She does not have vasomotor sx.  Since her last annual exam 06/30/2016, she has been evaluated for vertigo. Vertigo has improved, but is still symptomatic if she moves or gets up quickly. She has also had some bruising and does not remember any injury  She is married and is sexually active.  Contraception: Depo. Last dose 06/08/2017  Last Pap: 06/30/2016  Results were: NIL  Hx of STDs: HSV, has had rare outbreaks over the past 17 years. Last mammogram: 07/04/2016 Results were: Birads 2, stable and benign. There is no FH of breast cancer. There is no FH of ovarian cancer. The patient does do self-breast exams.   Tobacco use: The patient denies current or previous tobacco use. Alcohol use: occasional glass of wine Exercise: does hot yoga 4x/week, and is trying to incorporate more exercise into her routine She does get adequate calcium  in her diet. Her last cholesterol screen 2017 was borderline elevated. PCP is Fenton Malling at [facility].   Review of Systems: Review of Systems  Constitutional: Positive for malaise/fatigue. Negative for chills, fever and weight loss.  HENT: Negative for congestion, sinus pain and sore throat.        Positive for vertigo  Eyes: Negative for blurred vision and pain.  Respiratory: Negative for hemoptysis, shortness of breath and wheezing.   Cardiovascular: Negative for chest pain, palpitations and leg swelling.  Gastrointestinal: Negative for abdominal pain, blood in stool, diarrhea, heartburn, nausea and vomiting.  Genitourinary: Negative for dysuria, frequency, hematuria and urgency.       Positive for amenorrhea   Musculoskeletal: Positive for joint pain (left knee). Negative for back pain and myalgias.  Skin: Negative for itching and rash.  Neurological: Negative for dizziness, tingling and headaches.  Endo/Heme/Allergies: Positive for environmental allergies. Negative for polydipsia. Bruises/bleeds easily (easy bruising).       Negative for hirsutism   Psychiatric/Behavioral: Negative for depression. The patient is not nervous/anxious and does not have insomnia.      Past Medical History:  Past Medical History:  Diagnosis Date  . Allergy   . 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 Hypercholesterolemia     Past Surgical History:  Past Surgical History:  Procedure Laterality Date  . WISDOM TOOTH EXTRACTION     AGE 89; ALL FOUR    Medications: Prior to Admission medications   Medication Sig Start Date End Date Taking? Authorizing Provider  medroxyPROGESTERone (DEPO-PROVERA) 150 MG/ML injection Inject 150 mg into the muscle every 3 (three) months.   Yes Historical Provider, MD  Multiple Vitamin (MULTIVITAMIN) LIQD Take 5 mLs by mouth daily. Uses essential oil Lifelong Vitality vitamins   Yes Historical Provider, MD  Also takes collagen and magnesium, peppermint, lemon and apple cider vinegar.  Allergies:  No Known Allergies   Obstetric History:  OB History  Gravida Para Term Preterm AB Living  _0 SAB TAB Ectopic Multiple Live Births  1       1    # Outcome Date GA Lbr Len/2nd Weight Sex Delivery Anes PTL Lv  2 Term 09/16/89   7 lb 6 oz (  3.345 kg) M Vag-Spont   LIV  1 SAB             Social History:  Social History   Socioeconomic History  . Marital status: Married    Spouse name: Not on file  . Number of children: 1  . Years of education: 31  . Highest education level: Not on file  Occupational History  . Occupation: Chief Technology Officer  Social Needs  . Financial resource strain: Not on file  . Food insecurity:    Worry: Not on file    Inability: Not on file  .  Transportation needs:    Medical: Not on file    Non-medical: Not on file  Tobacco Use  . Smoking status: Never Smoker  . Smokeless tobacco: Never Used  Substance and Sexual Activity  . Alcohol use: Yes    Comment: OCC  . Drug use: No  . Sexual activity: Yes    Partners: Male    Birth control/protection: Injection    Comment: DEPO  Lifestyle  . Physical activity:    Days per week: Not on file    Minutes per session: Not on file  . Stress: Not on file  Relationships  . Social connections:    Talks on phone: Not on file    Gets together: Not on file    Attends religious service: Not on file    Active member of club or organization: Not on file    Attends meetings of clubs or organizations: Not on file    Relationship status: Not on file  . Intimate partner violence:    Fear of current or ex partner: Not on file    Emotionally abused: Not on file    Physically abused: Not on file    Forced sexual activity: Not on file  Other Topics Concern  . Not on file  Social History Narrative  . Not on file    Family History:  Family History  Problem Relation Age of Onset  . Healthy Mother   . Diabetes Maternal Grandmother   . Heart disease Paternal Grandfather        MIs in 22s     Physical Exam Vitals: BP (!) 142/82   Pulse 68   Ht _0  (1.575 m)   Wt 203 lb (92.1 kg)   BMI 37.13 kg/m    Physical Exam  Constitutional: She is oriented to person, place, and time. She appears well-developed and well-nourished. No distress.  HENT:  Head: Normocephalic and atraumatic.  Neck: Normal range of motion. No thyromegaly present.  Cardiovascular: Normal rate and regular rhythm.  No murmur heard. Respiratory: Effort normal and breath sounds normal.  Breast: symmetrical, no nipple or skin changes, no masses, NT. No infraclavicular, supraclavicular or axillary LAN  GI: Soft. She exhibits distension. She exhibits no mass. There is no tenderness. There is no guarding.  No  hepatomegaly. No evidence of hernia  Genitourinary:  Genitourinary Comments: Vulva: no lesions or inflammation Vagina: no lesions or masses Cervix: no masses, NT, anterior, friable os with Pap Uterus: Retroverted, NSSC, NT, mobile Adnexa: no masses, NT  Musculoskeletal: Normal range of motion.  Lymphadenopathy:    She has no cervical adenopathy.  Neurological: She is alert and oriented to person, place, and time.  Skin: Skin is warm and dry.  Psychiatric: She has a normal mood and affect.     Assessment: 47 y.o. G2P0011 well woman exam  Plan:  1) Contraception: Depo  2)  Cervical cancer screening:  Pap done  3) Routine healthcare maintenance including cholesterol, diabetes screening managed by PCP  4) Breast cancer screening: continue monthly SBE and annual screening mammograms. Patient has scheduled mammogram at [facility] this afternoon  5) Follow up 1 year for routine annual exam  6) Colon cancer screen: Hemoccult on stool-given home collection kit.   Dalia Heading, CNM

## 2017-07-18 LAB — CBC WITH DIFFERENTIAL/PLATELET
BASOS ABS: 0 10*3/uL (ref 0.0–0.2)
Basos: 0 %
EOS (ABSOLUTE): 0.2 10*3/uL (ref 0.0–0.4)
EOS: 2 %
HEMATOCRIT: 41.3 % (ref 34.0–46.6)
HEMOGLOBIN: 14.1 g/dL (ref 11.1–15.9)
IMMATURE GRANS (ABS): 0 10*3/uL (ref 0.0–0.1)
Immature Granulocytes: 0 %
LYMPHS: 42 %
Lymphocytes Absolute: 3.9 10*3/uL — ABNORMAL HIGH (ref 0.7–3.1)
MCH: 30.3 pg (ref 26.6–33.0)
MCHC: 34.1 g/dL (ref 31.5–35.7)
MCV: 89 fL (ref 79–97)
MONOCYTES: 4 %
Monocytes Absolute: 0.4 10*3/uL (ref 0.1–0.9)
Neutrophils Absolute: 4.8 10*3/uL (ref 1.4–7.0)
Neutrophils: 52 %
Platelets: 234 10*3/uL (ref 150–379)
RBC: 4.66 x10E6/uL (ref 3.77–5.28)
RDW: 14.4 % (ref 12.3–15.4)
WBC: 9.3 10*3/uL (ref 3.4–10.8)

## 2017-07-19 LAB — IGP,RFX APTIMA HPV ALL PTH: PAP Smear Comment: 0

## 2017-07-23 ENCOUNTER — Encounter (INDEPENDENT_AMBULATORY_CARE_PROVIDER_SITE_OTHER): Payer: Self-pay

## 2017-08-08 ENCOUNTER — Ambulatory Visit: Payer: BC Managed Care – PPO | Admitting: Physician Assistant

## 2017-08-08 ENCOUNTER — Encounter: Payer: Self-pay | Admitting: Physician Assistant

## 2017-08-08 VITALS — BP 138/84 | HR 62

## 2017-08-08 DIAGNOSIS — E7800 Pure hypercholesterolemia, unspecified: Secondary | ICD-10-CM | POA: Diagnosis not present

## 2017-08-08 DIAGNOSIS — I10 Essential (primary) hypertension: Secondary | ICD-10-CM

## 2017-08-08 DIAGNOSIS — Z6837 Body mass index (BMI) 37.0-37.9, adult: Secondary | ICD-10-CM | POA: Diagnosis not present

## 2017-08-08 NOTE — Patient Instructions (Signed)

## 2017-08-08 NOTE — Progress Notes (Signed)
Patient: Cassidy Kelly Female    DOB: 10-03-1970   47 y.o.   MRN: [id number] Visit Date: 08/08/2017  Today's Provider: Margaretann Loveless, PA-C   Chief Complaint  Patient presents with  . Establish Care  . Dizziness    seen at [facility]  . Hypertension   Subjective:    Dizziness  This is a recurrent problem. The problem occurs intermittently. Associated symptoms include congestion. Exacerbated by: sudden movement, riding in the back of the car. She has tried relaxation and sleep (home remedies) for the symptoms.  Hypertension  Associated symptoms include anxiety. (Dizziness) Past treatments include nothing.   Cassidy Kelly is a 47 yr old female that comes to the office today to establish care. She has no previous PCP. She has a few compaints today. Main complaint has been her elevated BP readings. She has noticed over her last few visits ([facility] and [facility]) her BP was elevated. She has been checking at home and reports SBP in 120s.   She has also recently been having some ear issues and is currently followed by ENT. She reports this is stable at the moment.     Allergies  Allergen Reactions  . Other     Cats  . Pollen Extract      Current Outpatient Medications:  .  COLLAGEN PO, Take by mouth daily., Disp: , Rfl:  .  Cyanocobalamin (B-12 PO), Take by mouth daily., Disp: , Rfl:  .  MAGNESIUM CITRATE PO, Take by mouth daily., Disp: , Rfl:  .  medium chain triglycerides (MCT OIL) oil, Take by mouth daily., Disp: , Rfl:  .  medroxyPROGESTERone (DEPO-PROVERA) 150 MG/ML injection, Inject 1 mL (150 mg total) into the muscle every 3 (three) months., Disp: 1 mL, Rfl: 3 .  Multiple Vitamin (MULTIVITAMIN) LIQD, Take 5 mLs by mouth daily. Uses essential oil Lifelong Vitality vitamins, Disp: , Rfl:   Review of Systems  Constitutional: Negative.   HENT: Positive for congestion, sinus pressure and tinnitus.   Eyes: Negative.   Respiratory: Negative.    Cardiovascular: Negative.   Gastrointestinal: Negative.   Endocrine: Negative.   Genitourinary: Negative.   Musculoskeletal: Negative.   Skin: Negative.   Allergic/Immunologic: Positive for environmental allergies.  Neurological: Positive for dizziness.  Hematological: Negative.   Psychiatric/Behavioral: Positive for decreased concentration.    Social History   Tobacco Use  . Smoking status: Never Smoker  . Smokeless tobacco: Never Used  Substance Use Topics  . Alcohol use: Yes    Comment: OCC   Objective:   BP 138/84 (BP Location: Left Arm, Patient Position: Sitting, Cuff Size: Large)   Pulse 62   LMP 08/01/2017   SpO2 99%  Vitals:   08/08/17 1517  BP: 138/84  Pulse: 62  SpO2: 99%     Physical Exam  Constitutional: She appears well-developed and well-nourished. No distress.  HENT:  Head: Normocephalic and atraumatic.  Right Ear: Hearing, tympanic membrane, external ear and ear canal normal.  Left Ear: Hearing, tympanic membrane, external ear and ear canal normal.  Nose: Nose normal.  Mouth/Throat: Uvula is midline, oropharynx is clear and moist and mucous membranes are normal. No oropharyngeal exudate.  Eyes: Pupils are equal, round, and reactive to light. Conjunctivae and EOM are normal. Right eye exhibits no discharge. Left eye exhibits no discharge.  Neck: Normal range of motion. Neck supple. No JVD present. No tracheal deviation present. No Brudzinski's sign and no Kernig's sign noted. No  thyromegaly present.  Cardiovascular: Normal rate, regular rhythm and normal heart sounds. Exam reveals no gallop and no friction rub.  No murmur heard. Pulmonary/Chest: Effort normal and breath sounds normal. No stridor. No respiratory distress. She has no wheezes. She has no rales. She exhibits no tenderness.  Genitourinary:  Genitourinary Comments: Followed by [facility]  Lymphadenopathy:    She has no cervical adenopathy.  Skin: Skin is warm and dry. She is not  diaphoretic.  Vitals reviewed.       Assessment & Plan:     1. Hypercholesterolemia H/O this. Working on dietary changes. Will check labs as below and f/u pending results. - Comprehensive Metabolic Panel (CMET) - TSH - HgB A1c - Lipid Profile  2. Essential hypertension Elevated today, borderline. She is making lifestyle changes and exercising. Will check labs as below and f/u pending results.She has a BP cuff at home she will continue to monitor. She is to call if remaining elevated. If labs are normal and she is well I will see her back in 1 year for CPE. - Comprehensive Metabolic Panel (CMET) - TSH - HgB A1c - Lipid Profile  3. BMI 37.0-37.9, adult Counseled patient on healthy lifestyle modifications including dieting and exercise.  - Comprehensive Metabolic Panel (CMET) - TSH - HgB A1c - Lipid Profile       Margaretann Loveless, PA-C  [facility]

## 2017-08-31 ENCOUNTER — Ambulatory Visit (INDEPENDENT_AMBULATORY_CARE_PROVIDER_SITE_OTHER): Payer: BC Managed Care – PPO

## 2017-08-31 DIAGNOSIS — Z3042 Encounter for surveillance of injectable contraceptive: Secondary | ICD-10-CM | POA: Diagnosis not present

## 2017-08-31 MED ORDER — MEDROXYPROGESTERONE ACETATE 150 MG/ML IM SUSP
150.0000 mg | Freq: Once | INTRAMUSCULAR | Status: AC
  Administered 2017-08-31: 150 mg via INTRAMUSCULAR

## 2017-09-25 ENCOUNTER — Other Ambulatory Visit: Payer: Self-pay

## 2017-09-25 LAB — COMPREHENSIVE METABOLIC PANEL
A/G RATIO: 1.7 (ref 1.2–2.2)
ALT: 19 IU/L (ref 0–32)
AST: 21 IU/L (ref 0–40)
Albumin: 4.4 g/dL (ref 3.5–5.5)
Alkaline Phosphatase: 73 IU/L (ref 39–117)
BILIRUBIN TOTAL: 0.3 mg/dL (ref 0.0–1.2)
BUN / CREAT RATIO: 8 — AB (ref 9–23)
BUN: 7 mg/dL (ref 6–24)
CALCIUM: 8.9 mg/dL (ref 8.7–10.2)
CHLORIDE: 109 mmol/L — AB (ref 96–106)
CO2: 18 mmol/L — ABNORMAL LOW (ref 20–29)
Creatinine, Ser: 0.9 mg/dL (ref 0.57–1.00)
GFR, EST AFRICAN AMERICAN: 87 mL/min/{1.73_m2} (ref >59)
GFR, EST NON AFRICAN AMERICAN: 76 mL/min/{1.73_m2} (ref >59)
GLOBULIN, TOTAL: 2.6 g/dL (ref 1.5–4.5)
Glucose: 105 mg/dL — ABNORMAL HIGH (ref 65–99)
Potassium: 4.1 mmol/L (ref 3.5–5.2)
SODIUM: 143 mmol/L (ref 134–144)
TOTAL PROTEIN: 7 g/dL (ref 6.0–8.5)

## 2017-09-25 LAB — HEMOGLOBIN A1C
Est. average glucose Bld gHb Est-mCnc: 114 mg/dL
Hgb A1c MFr Bld: 5.6 % (ref 4.8–5.6)

## 2017-09-25 LAB — LIPID PANEL
Chol/HDL Ratio: 5.2 ratio — ABNORMAL HIGH (ref 0.0–4.4)
Cholesterol, Total: 267 mg/dL — ABNORMAL HIGH (ref 100–199)
HDL: 51 mg/dL (ref >39)
LDL Calculated: 197 mg/dL — ABNORMAL HIGH (ref 0–99)
Triglycerides: 93 mg/dL (ref 0–149)
VLDL Cholesterol Cal: 19 mg/dL (ref 5–40)

## 2017-09-25 LAB — TSH: TSH: 3.21 u[IU]/mL (ref 0.450–4.500)

## 2017-09-26 ENCOUNTER — Telehealth: Payer: Self-pay

## 2017-09-26 NOTE — Telephone Encounter (Signed)
Viewed by Marcelle SmilingShondalette G Covington on 09/26/2017 10:25 AM

## 2017-09-26 NOTE — Telephone Encounter (Signed)
-----   Message from Margaretann LovelessJennifer M Burnette, [place] JerseyPA-C sent at 09/26/2017  9:23 AM EDT ----- Cholesterol is elevated but 10 yr risk of having a cardiovascular event is low at 3.12%. No need to start medication at this time. Continue your healthy lifestyle modifications you are already working on. Sugar, kidney function, liver function are normal.

## 2017-10-01 LAB — FECAL OCCULT BLOOD, IMMUNOCHEMICAL: Fecal Occult Bld: NEGATIVE

## 2017-10-01 LAB — SPECIMEN STATUS REPORT

## 2017-11-23 ENCOUNTER — Ambulatory Visit (INDEPENDENT_AMBULATORY_CARE_PROVIDER_SITE_OTHER): Payer: BC Managed Care – PPO

## 2017-11-23 DIAGNOSIS — Z3042 Encounter for surveillance of injectable contraceptive: Secondary | ICD-10-CM

## 2017-11-23 MED ORDER — MEDROXYPROGESTERONE ACETATE 150 MG/ML IM SUSP
150.0000 mg | Freq: Once | INTRAMUSCULAR | Status: AC
  Administered 2017-11-23: 150 mg via INTRAMUSCULAR

## 2018-02-15 ENCOUNTER — Ambulatory Visit (INDEPENDENT_AMBULATORY_CARE_PROVIDER_SITE_OTHER): Payer: BC Managed Care – PPO

## 2018-02-15 DIAGNOSIS — Z3042 Encounter for surveillance of injectable contraceptive: Secondary | ICD-10-CM

## 2018-02-15 MED ORDER — MEDROXYPROGESTERONE ACETATE 150 MG/ML IM SUSP
150.0000 mg | Freq: Once | INTRAMUSCULAR | Status: AC
  Administered 2018-02-15: 150 mg via INTRAMUSCULAR

## 2018-02-19 ENCOUNTER — Encounter: Payer: Self-pay | Admitting: Physician Assistant

## 2018-02-19 ENCOUNTER — Ambulatory Visit: Payer: BC Managed Care – PPO | Admitting: Physician Assistant

## 2018-02-19 VITALS — BP 130/80 | HR 67 | Temp 98.0°F | Resp 16 | Wt 213.0 lb

## 2018-02-19 DIAGNOSIS — F321 Major depressive disorder, single episode, moderate: Secondary | ICD-10-CM | POA: Diagnosis not present

## 2018-02-19 MED ORDER — ESCITALOPRAM OXALATE 10 MG PO TABS: 10.0000 mg | ORAL_TABLET | Freq: Every day | ORAL | 3 refills | Status: DC

## 2018-02-19 NOTE — Patient Instructions (Signed)
Escitalopram tablets What is this medicine? ESCITALOPRAM (es sye TAL oh pram) is used to treat depression and certain types of anxiety. This medicine may be used for other purposes; ask your health care provider or pharmacist if you have questions. COMMON BRAND NAME(S): Lexapro What should I tell my health care provider before I take this medicine? They need to know if you have any of these conditions: -bipolar disorder or a family history of bipolar disorder -diabetes -glaucoma -heart disease -kidney or liver disease -receiving electroconvulsive therapy -seizures (convulsions) -suicidal thoughts, plans, or attempt by you or a family member -an unusual or allergic reaction to escitalopram, the related drug citalopram, other medicines, foods, dyes, or preservatives -pregnant or trying to become pregnant -breast-feeding How should I use this medicine? Take this medicine by mouth with a glass of water. Follow the directions on the prescription label. You can take it with or without food. If it upsets your stomach, take it with food. Take your medicine at regular intervals. Do not take it more often than directed. Do not stop taking this medicine suddenly except upon the advice of your doctor. Stopping this medicine too quickly may cause serious side effects or your condition may worsen. A special MedGuide will be given to you by the pharmacist with each prescription and refill. Be sure to read this information carefully each time. Talk to your pediatrician regarding the use of this medicine in children. Special care may be needed. Overdosage: If you think you have taken too much of this medicine contact a poison control center or emergency room at once. NOTE: This medicine is only for you. Do not share this medicine with others. What if I miss a dose? If you miss a dose, take it as soon as you can. If it is almost time for your next dose, take only that dose. Do not take double or extra  doses. What may interact with this medicine? Do not take this medicine with any of the following medications: -certain medicines for fungal infections like fluconazole, itraconazole, ketoconazole, posaconazole, voriconazole -cisapride -citalopram -dofetilide -dronedarone -linezolid -MAOIs like Carbex, Eldepryl, Marplan, Nardil, and Parnate -methylene blue (injected into a vein) -pimozide -thioridazine -ziprasidone This medicine may also interact with the following medications: -alcohol -amphetamines -aspirin and aspirin-like medicines -carbamazepine -certain medicines for depression, anxiety, or psychotic disturbances -certain medicines for migraine headache like almotriptan, eletriptan, frovatriptan, naratriptan, rizatriptan, sumatriptan, zolmitriptan -certain medicines for sleep -certain medicines that treat or prevent blood clots like warfarin, enoxaparin, dalteparin -cimetidine -diuretics -fentanyl -furazolidone -isoniazid -lithium -metoprolol -NSAIDs, medicines for pain and inflammation, like ibuprofen or naproxen -other medicines that prolong the QT interval (cause an abnormal heart rhythm) -procarbazine -rasagiline -supplements like St. John's wort, kava kava, valerian -tramadol -tryptophan This list may not describe all possible interactions. Give your health care provider a list of all the medicines, herbs, non-prescription drugs, or dietary supplements you use. Also tell them if you smoke, drink alcohol, or use illegal drugs. Some items may interact with your medicine. What should I watch for while using this medicine? Tell your doctor if your symptoms do not get better or if they get worse. Visit your doctor or health care professional for regular checks on your progress. Because it may take several weeks to see the full effects of this medicine, it is important to continue your treatment as prescribed by your doctor. Patients and their families should watch out for  new or worsening thoughts of suicide or depression. Also watch out for   sudden changes in feelings such as feeling anxious, agitated, panicky, irritable, hostile, aggressive, impulsive, severely restless, overly excited and hyperactive, or not being able to sleep. If this happens, especially at the beginning of treatment or after a change in dose, call your health care professional. You may get drowsy or dizzy. Do not drive, use machinery, or do anything that needs mental alertness until you know how this medicine affects you. Do not stand or sit up quickly, especially if you are an older patient. This reduces the risk of dizzy or fainting spells. Alcohol may interfere with the effect of this medicine. Avoid alcoholic drinks. Your mouth may get dry. Chewing sugarless gum or sucking hard candy, and drinking plenty of water may help. Contact your doctor if the problem does not go away or is severe. What side effects may I notice from receiving this medicine? Side effects that you should report to your doctor or health care professional as soon as possible: -allergic reactions like skin rash, itching or hives, swelling of the face, lips, or tongue -anxious -black, tarry stools -changes in vision -confusion -elevated mood, decreased need for sleep, racing thoughts, impulsive behavior -eye pain -fast, irregular heartbeat -feeling faint or lightheaded, falls -feeling agitated, angry, or irritable -hallucination, loss of contact with reality -loss of balance or coordination -loss of memory -painful or prolonged erections -restlessness, pacing, inability to keep still -seizures -stiff muscles -suicidal thoughts or other mood changes -trouble sleeping -unusual bleeding or bruising -unusually weak or tired -vomiting Side effects that usually do not require medical attention (report to your doctor or health care professional if they continue or are bothersome): -changes in appetite -change in sex  drive or performance -headache -increased sweating -indigestion, nausea -tremors This list may not describe all possible side effects. Call your doctor for medical advice about side effects. You may report side effects to FDA at 1-800-FDA-1088. Where should I keep my medicine? Keep out of reach of children. Store at room temperature between 15 and 30 degrees C (59 and 86 degrees F). Throw away any unused medicine after the expiration date. NOTE: This sheet is a summary. It may not cover all possible information. If you have questions about this medicine, talk to your doctor, pharmacist, or health care provider.  2018 Elsevier/Gold Standard (2015-08-16 13:20:23)  

## 2018-02-19 NOTE — Progress Notes (Signed)
Patient: Cassidy Kelly Female    DOB: 08-10-1970   47 y.o.   MRN: [id number] Visit Date: 02/19/2018  Today's Provider: Margaretann Loveless, PA-C   Chief Complaint  Patient presents with  . Fatigue  . Memory issues   Subjective:    HPI Fatigue: Patient reports that for the past 2 months and worsening for the past few weeks. She reports that she has been having some twitching all over her face off and on for the past few weeks. She reports that she has been stressing a lot more. She also still going through separation. She reports she is just tired overall.  Memory: Patient reports that she is not able to focused for the past 3 weeks. She has notice she is forgetting little details.  Depression screen [facility] 2/9 02/19/2018 08/08/2017 10/11/2015  Decreased Interest 1 1 0  Down, Depressed, Hopeless 3 1 0  PHQ - 2 Score 4 2 0  Altered sleeping 3 3 -  Tired, decreased energy 3 3 -  Change in appetite (No Data) 3 -  Feeling bad or failure about yourself  3 3 -  Trouble concentrating 3 3 -  Moving slowly or fidgety/restless 1 0 -  Suicidal thoughts 0 0 -  PHQ-9 Score 17 17 -  Difficult doing work/chores Very difficult - -      Allergies  Allergen Reactions  . Other     Cats  . Pollen Extract      Current Outpatient Medications:  .  COLLAGEN PO, Take by mouth daily., Disp: , Rfl:  .  Cyanocobalamin (B-12 PO), Take by mouth daily., Disp: , Rfl:  .  MAGNESIUM CITRATE PO, Take by mouth daily., Disp: , Rfl:  .  medium chain triglycerides (MCT OIL) oil, Take by mouth daily., Disp: , Rfl:  .  medroxyPROGESTERone (DEPO-PROVERA) 150 MG/ML injection, Inject 1 mL (150 mg total) into the muscle every 3 (three) months., Disp: 1 mL, Rfl: 3 .  Multiple Vitamin (MULTIVITAMIN) LIQD, Take 5 mLs by mouth daily. Uses essential oil Lifelong Vitality vitamins, Disp: , Rfl:  .  escitalopram (LEXAPRO) 10 MG tablet, Take 1 tablet (10 mg total) by mouth at bedtime., Disp: 30 tablet,  Rfl: 3  Review of Systems  Constitutional: Negative.   Respiratory: Negative.   Cardiovascular: Negative.   Musculoskeletal: Negative.   Neurological: Negative.   Psychiatric/Behavioral: Positive for decreased concentration and sleep disturbance. The patient is nervous/anxious.     Social History   Tobacco Use  . Smoking status: Never Smoker  . Smokeless tobacco: Never Used  Substance Use Topics  . Alcohol use: Yes    Comment: OCC   Objective:   BP 130/80 (BP Location: Left Arm, Patient Position: Sitting, Cuff Size: Large)   Pulse 67   Temp 98 F (36.7 C) (Oral)   Resp 16   Wt 213 lb (96.6 kg)   BMI 38.96 kg/m  Vitals:   02/19/18 1609 02/19/18 1735  BP: (!) 180/106 130/80  Pulse: 67   Resp: 16   Temp: 98 F (36.7 C)   TempSrc: Oral   Weight: 213 lb (96.6 kg)      Physical Exam  Constitutional: She appears well-developed and well-nourished. No distress.  Neck: Normal range of motion. Neck supple.  Cardiovascular: Normal rate, regular rhythm and normal heart sounds. Exam reveals no gallop and no friction rub.  No murmur heard. Pulmonary/Chest: Effort normal and breath sounds normal. No respiratory distress.  She has no wheezes. She has no rales.  Skin: She is not diaphoretic.  Psychiatric: Her speech is normal and behavior is normal. Judgment and thought content normal. She exhibits a depressed mood.  Vitals reviewed.       Assessment & Plan:     1. Depression, major, single episode, moderate (HCC) Worsening. Will add escitalopram as below. She is going to contact [employer] through her employer for counseling services. I will see her back in 4-6 weeks.  - escitalopram (LEXAPRO) 10 MG tablet; Take 1 tablet (10 mg total) by mouth at bedtime.  Dispense: 30 tablet; Refill: 3       Margaretann LovelessJennifer M Irelyn Perfecto, PA-C  [facility]

## 2018-04-08 ENCOUNTER — Ambulatory Visit: Payer: Self-pay | Admitting: Physician Assistant

## 2018-04-15 NOTE — Progress Notes (Signed)
Patient: Cassidy Kelly Female    DOB: 08/26/1970   48 y.o.   MRN: [id number] Visit Date: 04/17/2018  Today's Provider: Margaretann Loveless, PA-C   Chief Complaint  Patient presents with  . Follow-up   Subjective:     HPI   Depression, major, single episode, moderate (HCC) From 02/19/2018-added escitalopram 10 mg qd. She is going to contact [employer] through her employer for counseling services. I will see her back in 4-6 weeks.   Patient never started medication but reports improvements in symptoms. She has been making lifestyle changes and taking more time for herself. She is getting massages monthly. She is working on healthy dietary habits with meal prep, cutting back portions, cutting out meat. She is planning to start exercising next.   Allergies  Allergen Reactions  . Other     Cats  . Pollen Extract      Current Outpatient Medications:  .  COLLAGEN PO, Take by mouth daily., Disp: , Rfl:  .  Cyanocobalamin (B-12 PO), Take by mouth daily., Disp: , Rfl:  .  MAGNESIUM CITRATE PO, Take by mouth daily., Disp: , Rfl:  .  medium chain triglycerides (MCT OIL) oil, Take by mouth daily., Disp: , Rfl:  .  medroxyPROGESTERone (DEPO-PROVERA) 150 MG/ML injection, Inject 1 mL (150 mg total) into the muscle every 3 (three) months., Disp: 1 mL, Rfl: 3 .  Multiple Vitamin (MULTIVITAMIN) LIQD, Take 5 mLs by mouth daily. Uses essential oil Lifelong Vitality vitamins, Disp: , Rfl:  .  escitalopram (LEXAPRO) 10 MG tablet, Take 1 tablet (10 mg total) by mouth at bedtime. (Patient not taking: Reported on 04/17/2018), Disp: 30 tablet, Rfl: 3  Review of Systems  Constitutional: Negative for appetite change, chills, fatigue and fever.  Respiratory: Negative for chest tightness and shortness of breath.   Cardiovascular: Negative for chest pain and palpitations.  Gastrointestinal: Negative for abdominal pain, nausea and vomiting.  Neurological: Negative for dizziness and weakness.    Psychiatric/Behavioral: Negative for decreased concentration, dysphoric mood, self-injury, sleep disturbance and suicidal ideas. The patient is not nervous/anxious.     Social History   Tobacco Use  . Smoking status: Never Smoker  . Smokeless tobacco: Never Used  Substance Use Topics  . Alcohol use: Yes    Comment: OCC      Objective:   BP (!) 158/77 (BP Location: Right Arm, Patient Position: Sitting, Cuff Size: Large)   Pulse 64   Temp 98 F (36.7 C) (Oral)   Resp 16   Wt 211 lb (95.7 kg)   SpO2 98%   BMI 38.59 kg/m  Vitals:   04/17/18 1653  BP: (!) 158/77  Pulse: 64  Resp: 16  Temp: 98 F (36.7 C)  TempSrc: Oral  SpO2: 98%  Weight: 211 lb (95.7 kg)     Physical Exam Vitals signs reviewed.  Constitutional:      General: She is not in acute distress.    Appearance: She is well-developed. She is not diaphoretic.  Neck:     Musculoskeletal: Normal range of motion and neck supple.  Cardiovascular:     Rate and Rhythm: Normal rate and regular rhythm.     Heart sounds: Normal heart sounds. No murmur. No friction rub. No gallop.   Pulmonary:     Effort: Pulmonary effort is normal. No respiratory distress.     Breath sounds: Normal breath sounds. No wheezing or rales.        Assessment &  Plan    1. Situational depression Improved with self care and group support from family and friends. Call if symptoms return.   2. Elevated blood pressure reading Elevated last 2 office visits. Advised to check BP at home, start DASH dieting habits and we will address in the next coming weeks if BP still elevated at home. She is to send a Clinical cytogeneticistmychart message with BP readings.      Margaretann LovelessJennifer M Delayla Hoffmaster, PA-C  [facility]

## 2018-04-17 ENCOUNTER — Ambulatory Visit: Payer: BC Managed Care – PPO | Admitting: Physician Assistant

## 2018-04-17 ENCOUNTER — Encounter: Payer: Self-pay | Admitting: Physician Assistant

## 2018-04-17 VITALS — BP 158/77 | HR 64 | Temp 98.0°F | Resp 16 | Wt 211.0 lb

## 2018-04-17 DIAGNOSIS — F4321 Adjustment disorder with depressed mood: Secondary | ICD-10-CM

## 2018-04-17 DIAGNOSIS — R030 Elevated blood-pressure reading, without diagnosis of hypertension: Secondary | ICD-10-CM

## 2018-04-17 NOTE — Patient Instructions (Signed)
DASH Eating Plan  DASH stands for "Dietary Approaches to Stop Hypertension." The DASH eating plan is a healthy eating plan that has been shown to reduce high blood pressure (hypertension). It may also reduce your risk for type 2 diabetes, heart disease, and stroke. The DASH eating plan may also help with weight loss.  What are tips for following this plan?    General guidelines   Avoid eating more than 2,300 mg (milligrams) of salt (sodium) a day. If you have hypertension, you may need to reduce your sodium intake to 1,500 mg a day.   Limit alcohol intake to no more than 1 drink a day for nonpregnant women and 2 drinks a day for men. One drink equals 12 oz of beer, 5 oz of wine, or 1 oz of hard liquor.   Work with your health care provider to maintain a healthy body weight or to lose weight. Ask what an ideal weight is for you.   Get at least 30 minutes of exercise that causes your heart to beat faster (aerobic exercise) most days of the week. Activities may include walking, swimming, or biking.   Work with your health care provider or diet and nutrition specialist (dietitian) to adjust your eating plan to your individual calorie needs.  Reading food labels     Check food labels for the amount of sodium per serving. Choose foods with less than 5 percent of the Daily Value of sodium. Generally, foods with less than 300 mg of sodium per serving fit into this eating plan.   To find whole grains, look for the word "whole" as the first word in the ingredient list.  Shopping   Buy products labeled as "low-sodium" or "no salt added."   Buy fresh foods. Avoid canned foods and premade or frozen meals.  Cooking   Avoid adding salt when cooking. Use salt-free seasonings or herbs instead of table salt or sea salt. Check with your health care provider or pharmacist before using salt substitutes.   Do not fry foods. Cook foods using healthy methods such as baking, boiling, grilling, and broiling instead.   Cook with  heart-healthy oils, such as olive, canola, soybean, or sunflower oil.  Meal planning   Eat a balanced diet that includes:  ? 5 or more servings of fruits and vegetables each day. At each meal, try to fill half of your plate with fruits and vegetables.  ? Up to 6-8 servings of whole grains each day.  ? Less than 6 oz of lean meat, poultry, or fish each day. A 3-oz serving of meat is about the same size as a deck of cards. One egg equals 1 oz.  ? 2 servings of low-fat dairy each day.  ? A serving of nuts, seeds, or beans 5 times each week.  ? Heart-healthy fats. Healthy fats called Omega-3 fatty acids are found in foods such as flaxseeds and coldwater fish, like sardines, salmon, and mackerel.   Limit how much you eat of the following:  ? Canned or prepackaged foods.  ? Food that is high in trans fat, such as fried foods.  ? Food that is high in saturated fat, such as fatty meat.  ? Sweets, desserts, sugary drinks, and other foods with added sugar.  ? Full-fat dairy products.   Do not salt foods before eating.   Try to eat at least 2 vegetarian meals each week.   Eat more home-cooked food and less restaurant, buffet, and fast food.     When eating at a restaurant, ask that your food be prepared with less salt or no salt, if possible.  What foods are recommended?  The items listed may not be a complete list. Talk with your dietitian about what dietary choices are best for you.  Grains  Whole-grain or whole-wheat bread. Whole-grain or whole-wheat pasta. Brown rice. Oatmeal. Quinoa. Bulgur. Whole-grain and low-sodium cereals. Pita bread. Low-fat, low-sodium crackers. Whole-wheat flour tortillas.  Vegetables  Fresh or frozen vegetables (raw, steamed, roasted, or grilled). Low-sodium or reduced-sodium tomato and vegetable juice. Low-sodium or reduced-sodium tomato sauce and tomato paste. Low-sodium or reduced-sodium canned vegetables.  Fruits  All fresh, dried, or frozen fruit. Canned fruit in natural juice (without  added sugar).  Meat and other protein foods  Skinless chicken or turkey. Ground chicken or turkey. Pork with fat trimmed off. Fish and seafood. Egg whites. Dried beans, peas, or lentils. Unsalted nuts, nut butters, and seeds. Unsalted canned beans. Lean cuts of beef with fat trimmed off. Low-sodium, lean deli meat.  Dairy  Low-fat (1%) or fat-free (skim) milk. Fat-free, low-fat, or reduced-fat cheeses. Nonfat, low-sodium ricotta or cottage cheese. Low-fat or nonfat yogurt. Low-fat, low-sodium cheese.  Fats and oils  Soft margarine without trans fats. Vegetable oil. Low-fat, reduced-fat, or light mayonnaise and salad dressings (reduced-sodium). Canola, safflower, olive, soybean, and sunflower oils. Avocado.  Seasoning and other foods  Herbs. Spices. Seasoning mixes without salt. Unsalted popcorn and pretzels. Fat-free sweets.  What foods are not recommended?  The items listed may not be a complete list. Talk with your dietitian about what dietary choices are best for you.  Grains  Baked goods made with fat, such as croissants, muffins, or some breads. Dry pasta or rice meal packs.  Vegetables  Creamed or fried vegetables. Vegetables in a cheese sauce. Regular canned vegetables (not low-sodium or reduced-sodium). Regular canned tomato sauce and paste (not low-sodium or reduced-sodium). Regular tomato and vegetable juice (not low-sodium or reduced-sodium). Pickles. Olives.  Fruits  Canned fruit in a light or heavy syrup. Fried fruit. Fruit in cream or butter sauce.  Meat and other protein foods  Fatty cuts of meat. Ribs. Fried meat. Bacon. Sausage. Bologna and other processed lunch meats. Salami. Fatback. Hotdogs. Bratwurst. Salted nuts and seeds. Canned beans with added salt. Canned or smoked fish. Whole eggs or egg yolks. Chicken or turkey with skin.  Dairy  Whole or 2% milk, cream, and half-and-half. Whole or full-fat cream cheese. Whole-fat or sweetened yogurt. Full-fat cheese. Nondairy creamers. Whipped toppings.  Processed cheese and cheese spreads.  Fats and oils  Butter. Stick margarine. Lard. Shortening. Ghee. Bacon fat. Tropical oils, such as coconut, palm kernel, or palm oil.  Seasoning and other foods  Salted popcorn and pretzels. Onion salt, garlic salt, seasoned salt, table salt, and sea salt. Worcestershire sauce. Tartar sauce. Barbecue sauce. Teriyaki sauce. Soy sauce, including reduced-sodium. Steak sauce. Canned and packaged gravies. Fish sauce. Oyster sauce. Cocktail sauce. Horseradish that you find on the shelf. Ketchup. Mustard. Meat flavorings and tenderizers. Bouillon cubes. Hot sauce and Tabasco sauce. Premade or packaged marinades. Premade or packaged taco seasonings. Relishes. Regular salad dressings.  Where to find more information:   National Heart, Lung, and Blood Institute: www.nhlbi.nih.gov   American Heart Association: www.heart.org  Summary   The DASH eating plan is a healthy eating plan that has been shown to reduce high blood pressure (hypertension). It may also reduce your risk for type 2 diabetes, heart disease, and stroke.   With the   DASH eating plan, you should limit salt (sodium) intake to 2,300 mg a day. If you have hypertension, you may need to reduce your sodium intake to 1,500 mg a day.   When on the DASH eating plan, aim to eat more fresh fruits and vegetables, whole grains, lean proteins, low-fat dairy, and heart-healthy fats.   Work with your health care provider or diet and nutrition specialist (dietitian) to adjust your eating plan to your individual calorie needs.  This information is not intended to replace advice given to you by your health care provider. Make sure you discuss any questions you have with your health care provider.  Document Released: 03/02/2011 Document Revised: 03/06/2016 Document Reviewed: 03/06/2016  Elsevier Interactive Patient Education  2019 Elsevier Inc[employer]

## 2018-05-10 ENCOUNTER — Ambulatory Visit (INDEPENDENT_AMBULATORY_CARE_PROVIDER_SITE_OTHER): Payer: BC Managed Care – PPO

## 2018-05-10 ENCOUNTER — Telehealth: Payer: Self-pay | Admitting: Certified Nurse Midwife

## 2018-05-10 DIAGNOSIS — Z3042 Encounter for surveillance of injectable contraceptive: Secondary | ICD-10-CM

## 2018-05-10 MED ORDER — MEDROXYPROGESTERONE ACETATE 150 MG/ML IM SUSP
150.0000 mg | Freq: Once | INTRAMUSCULAR | Status: AC
  Administered 2018-05-10: 150 mg via INTRAMUSCULAR

## 2018-05-10 NOTE — Telephone Encounter (Signed)
Patient will need refill for next depo appointment, 5/11, she is scheduled for annual/depo on this day with [facility].

## 2018-05-10 NOTE — Progress Notes (Addendum)
Pt presents today for Depo Provera injection within dates. Given IM LUOQ. Pt tolerated well. 

## 2018-05-17 ENCOUNTER — Other Ambulatory Visit: Payer: Self-pay | Admitting: Certified Nurse Midwife

## 2018-05-17 MED ORDER — MEDROXYPROGESTERONE ACETATE 150 MG/ML IM SUSP: 150.0000 mg | INTRAMUSCULAR | 0 refills | Status: DC

## 2018-05-17 NOTE — Telephone Encounter (Signed)
Refill ordered

## 2018-08-01 ENCOUNTER — Telehealth: Payer: Self-pay | Admitting: Certified Nurse Midwife

## 2018-08-01 NOTE — Telephone Encounter (Signed)
Patient was schedule 08/05/18 for annual. Due to Covid 19 appointment has been reschedule to 09/23/18 with [facility]. Patient is requesting refill on Depo. Patient is schedule for injection on 08/05/18

## 2018-08-02 ENCOUNTER — Ambulatory Visit: Payer: BC Managed Care – PPO

## 2018-08-05 ENCOUNTER — Other Ambulatory Visit: Payer: Self-pay

## 2018-08-05 ENCOUNTER — Ambulatory Visit (INDEPENDENT_AMBULATORY_CARE_PROVIDER_SITE_OTHER): Payer: BC Managed Care – PPO

## 2018-08-05 ENCOUNTER — Ambulatory Visit: Payer: BC Managed Care – PPO | Admitting: Certified Nurse Midwife

## 2018-08-05 DIAGNOSIS — Z3042 Encounter for surveillance of injectable contraceptive: Secondary | ICD-10-CM

## 2018-08-05 MED ORDER — MEDROXYPROGESTERONE ACETATE 150 MG/ML IM SUSP
150.0000 mg | Freq: Once | INTRAMUSCULAR | Status: AC
  Administered 2018-08-05: 15:00:00 150 mg via INTRAMUSCULAR

## 2018-09-23 ENCOUNTER — Encounter: Payer: Self-pay | Admitting: Certified Nurse Midwife

## 2018-09-23 ENCOUNTER — Ambulatory Visit (INDEPENDENT_AMBULATORY_CARE_PROVIDER_SITE_OTHER): Payer: BC Managed Care – PPO | Admitting: Certified Nurse Midwife

## 2018-09-23 ENCOUNTER — Other Ambulatory Visit: Payer: Self-pay

## 2018-09-23 VITALS — BP 160/80 | HR 85 | Ht 62.0 in | Wt 212.0 lb

## 2018-09-23 DIAGNOSIS — N898 Other specified noninflammatory disorders of vagina: Secondary | ICD-10-CM

## 2018-09-23 DIAGNOSIS — Z1211 Encounter for screening for malignant neoplasm of colon: Secondary | ICD-10-CM

## 2018-09-23 DIAGNOSIS — B9689 Other specified bacterial agents as the cause of diseases classified elsewhere: Secondary | ICD-10-CM

## 2018-09-23 DIAGNOSIS — Z1239 Encounter for other screening for malignant neoplasm of breast: Secondary | ICD-10-CM

## 2018-09-23 DIAGNOSIS — Z01419 Encounter for gynecological examination (general) (routine) without abnormal findings: Secondary | ICD-10-CM | POA: Diagnosis not present

## 2018-09-23 DIAGNOSIS — Z124 Encounter for screening for malignant neoplasm of cervix: Secondary | ICD-10-CM

## 2018-09-23 MED ORDER — MEDROXYPROGESTERONE ACETATE 150 MG/ML IM SUSP: 150.0000 mg | INTRAMUSCULAR | 1 refills | Status: DC

## 2018-09-23 MED ORDER — METRONIDAZOLE 0.75 % VA GEL: 1.0000 | Freq: Every day | VAGINAL | 0 refills | Status: AC

## 2018-09-23 NOTE — Progress Notes (Signed)
Gynecology Annual Exam  PCP: Margaretann LovelessBurnette, Jennifer M, PA-C  Chief Complaint:  Chief Complaint  Patient presents with  . Gynecologic Exam    vaginal d/c w/odor this past month    History of Present Illness Cassidy Kelly is a 48 y.o. G2P1011 BF who who presents today for her annual examination.  Her menses are absent due to Depo Provera She does not have vasomotor sx.  Since her last annual exam 07/17/2017, she has had no significant changes in her health. Has gained 9# in the last year.  More recently she has noticed an intermittent vaginal discharge with a  fishy odor  She is married and is sexually active.  Contraception: Depo. Last dose 08/05/2018  Last Pap: 07/17/2017  Results were: NIL Remote history of abnormal Pap in her late 5320s. 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 Last mammogram: 07/17/2017 Results were: Birads 1 There is no FH of breast cancer. There is no FH of ovarian cancer. The patient does do self-breast exams.   Tobacco use: The patient denies current or previous tobacco use. Alcohol use: occasional glass of wine Exercise: does hot yoga 4x/week, and is trying to incorporate more exercise into her routine She may get adequate calcium  in her diet. Her last cholesterol screen 2019 was abnormal (TC 267, LDL 197) PCP [facility].   Review of Systems: Review of Systems  Constitutional: Negative for chills, fever and weight loss.  HENT: Negative for congestion, sinus pain and sore throat.   Eyes: Negative for blurred vision and pain.  Respiratory: Negative for hemoptysis, shortness of breath and wheezing.   Cardiovascular: Negative for chest pain, palpitations and leg swelling.  Gastrointestinal: Negative for abdominal pain, blood in stool, diarrhea, heartburn, nausea and vomiting.  Genitourinary: Negative for dysuria, frequency, hematuria and urgency.       Positive for amenorrhea and vaginal discharge  with odor  Musculoskeletal: Negative for back pain, joint pain (left knee) and myalgias.  Skin: Negative for itching and rash.  Neurological: Negative for dizziness, tingling and headaches.  Endo/Heme/Allergies: Positive for environmental allergies. Negative for polydipsia. Does not bruise/bleed easily (easy bruising).       Negative for hirsutism   Psychiatric/Behavioral: Negative for depression. The patient is not nervous/anxious and does not have insomnia.      Past Medical History:  Past Medical History:  Diagnosis Date  . Allergy   . 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 Hypercholesterolemia   . Obesity (BMI 30-39.9)     Past Surgical History:  Past Surgical History:  Procedure Laterality Date  . WISDOM TOOTH EXTRACTION     AGE 69; ALL FOUR    Medications: Prior to Admission medications   Medication Sig Start Date End Date Taking? Authorizing Provider  medroxyPROGESTERone (DEPO-PROVERA) 150 MG/ML injection Inject 150 mg into the muscle every 3 (three) months.   Yes Historical Provider, MD  Multiple Vitamin (MULTIVITAMIN) LIQD Take 5 mLs by mouth daily. Uses essential oil Lifelong Vitality vitamins   Yes Historical Provider, MD  Also takes collagen and magnesium, medium chain triglycerides oil, fish oil supplements, a probiotic, and tumeric (essential oils)  Allergies:  Allergies  Allergen Reactions  . Other     Cats  . Pollen Extract      Obstetric History:  OB History  Gravida Para Term Preterm AB Living  2 1 1   1 1   SAB TAB Ectopic  Multiple Live Births  1       1    # Outcome Date GA Lbr Len/2nd Weight Sex Delivery Anes PTL Lv  2 Term 09/16/89   7 lb 6 oz (3.345 kg) M Vag-Spont   LIV  1 SAB             Social History:  Social History   Socioeconomic History  . Marital status: Legally Separated    Spouse name: Not on file  . Number of children: 1  . Years of education: 72  . Highest education level: Not on file  Occupational History  . Occupation: Transport planner  Social Needs  . Financial resource strain: Not on file  . Food insecurity    Worry: Not on file    Inability: Not on file  . Transportation needs    Medical: Not on file    Non-medical: Not on file  Tobacco Use  . Smoking status: Never Smoker  . Smokeless tobacco: Never Used  Substance and Sexual Activity  . Alcohol use: Yes    Comment: OCC  . Drug use: No  . Sexual activity: Not Currently    Partners: Male    Birth control/protection: Injection    Comment: DEPO  Lifestyle  . Physical activity    Days per week: 5 days    Minutes per session: 60 min  . Stress: Rather much  Relationships  . Social Herbalist on phone: Not on file    Gets together: Not on file    Attends religious service: Not on file    Active member of club or organization: Not on file    Attends meetings of clubs or organizations: Not on file    Relationship status: Not on file  . Intimate partner violence    Fear of current or ex partner: Not on file    Emotionally abused: Not on file    Physically abused: Not on file    Forced sexual activity: Not on file  Other Topics Concern  . Not on file  Social History Narrative  . Not on file    Family History:  Family History  Problem Relation Age of Onset  . Healthy Mother   . Diabetes Maternal Grandmother   . Heart disease Paternal Grandfather        MIs in 2s     Physical Exam Vitals: BP (!) 160/80 (BP Location: Right Arm, Patient Position: Sitting, Cuff Size: Large)   Pulse 85   Ht 5\' 2"  (1.575 m)   Wt 212 lb (96.2 kg)   BMI 38.78 kg/m    Physical Exam  Constitutional: She is oriented to person, place, and time. She appears well-developed and well-nourished. No distress.  HENT:  Head: Normocephalic and atraumatic.  Neck: Normal range of motion. No thyromegaly present.  Cardiovascular: Normal rate and regular rhythm.  No murmur heard. Respiratory: Effort normal and breath sounds normal.  Breast:  symmetrical, no nipple or skin changes, no masses, NT. No infraclavicular, supraclavicular or axillary LAN  GI: Soft. She exhibits no distension and no mass. There is no abdominal tenderness. There is no guarding.  No hepatomegaly. No evidence of hernia  Genitourinary:    Genitourinary Comments: Vulva: no lesions or inflammation Vagina: no lesions or masses, small amount white homogenous discharge Cervix: no masses, NT, anterior, friable os with Pap Uterus: Retroverted, NSSC, NT, mobile Adnexa: no masses, NT   Musculoskeletal: Normal range of motion.  Lymphadenopathy:  She has no cervical adenopathy.  Neurological: She is alert and oriented to person, place, and time.  Skin: Skin is warm and dry.  Psychiatric: She has a normal mood and affect.   Results for orders placed or performed in visit on 09/23/18 (from the past 48 hour(s))  POCT Wet Prep Mellody Drown(Wet [place])     Status: Abnormal   Collection Time: 09/25/18 10:16 PM  Result Value Ref Range   Source Wet Prep POC vagina    WBC, Wet Prep HPF POC     Bacteria Wet Prep HPF POC Many (A) Few   BACTERIA WET PREP MORPHOLOGY POC     Clue Cells Wet Prep HPF POC Few (A) None   Clue Cells Wet Prep Whiff POC     Yeast Wet Prep HPF POC None None   KOH Wet Prep POC     Trichomonas Wet Prep HPF POC Absent Absent    Assessment: 48 y.o. G2P0011 well woman exam Amenorrhea on Depo provera Elevated blood pressure Bacterial vaginosis  Plan:  1) Contraception: Depo provera 150 mgm IM q12 weeks. Next dose due week of 3 August.   2) Cervical cancer screening:  Pap done. Desires Pap smear every other year.  3) Routine healthcare maintenance including cholesterol, diabetes screening managed by PCP. Last couple blood pressures at PCP have been elevated and PCP discussed DASH diet and weight loss. Follow up with PCP regarding blood pressures.  4) Breast cancer screening: continue monthly SBE and annual screening mammograms. [facility]  5) Follow up 1 year for routine annual exam  6) Colon cancer screen: refer to [facility] for colonoscopy   Farrel Connersolleen Riese Hellard, CNM

## 2018-09-25 ENCOUNTER — Encounter: Payer: Self-pay | Admitting: Certified Nurse Midwife

## 2018-09-25 LAB — POCT WET PREP (WET MOUNT): Trichomonas Wet Prep HPF POC: ABSENT

## 2018-09-26 ENCOUNTER — Other Ambulatory Visit: Payer: Self-pay

## 2018-09-26 ENCOUNTER — Other Ambulatory Visit
Admission: RE | Admit: 2018-09-26 | Discharge: 2018-09-26 | Disposition: A | Discharge status: Home / Self Care | Payer: BC Managed Care – PPO | Source: Ambulatory Visit | Attending: Gastroenterology | Admitting: Gastroenterology

## 2018-09-26 ENCOUNTER — Telehealth: Payer: Self-pay

## 2018-09-26 DIAGNOSIS — Z1211 Encounter for screening for malignant neoplasm of colon: Secondary | ICD-10-CM

## 2018-09-26 DIAGNOSIS — Z1159 Encounter for screening for other viral diseases: Secondary | ICD-10-CM | POA: Insufficient documentation

## 2018-09-26 LAB — SARS CORONAVIRUS 2 (TAT 6-24 HRS): SARS Coronavirus 2: NEGATIVE

## 2018-09-26 MED ORDER — NA SULFATE-K SULFATE-MG SULF 17.5-3.13-1.6 GM/177ML PO SOLN: 1.0000 | Freq: Once | ORAL | 0 refills | Status: AC

## 2018-09-26 NOTE — Telephone Encounter (Signed)
Gastroenterology Pre-Procedure Review  Request Date: 09/30/18 Requesting Physician: Dr. Vicente Males  PATIENT REVIEW QUESTIONS: The patient responded to the following health history questions as indicated:    1. Are you having any GI issues? no 2. Do you have a personal history of Polyps? no 3. Do you have a family history of Colon Cancer or Polyps? no 4. Diabetes Mellitus? no 5. Joint replacements in the past 12 months?no 6. Major health problems in the past 3 months?no 7. Any artificial heart valves, MVP, or defibrillator?no    MEDICATIONS & ALLERGIES:    Patient reports the following regarding taking any anticoagulation/antiplatelet therapy:   Plavix, Coumadin, Eliquis, Xarelto, Lovenox, Pradaxa, Brilinta, or Effient? no Aspirin? no  Patient confirms/reports the following medications:  Current Outpatient Medications  Medication Sig Dispense Refill  . COLLAGEN PO Take by mouth daily.    . Cyanocobalamin (B-12 PO) Take by mouth daily.    Marland Kitchen MAGNESIUM CITRATE PO Take by mouth daily.    . medium chain triglycerides (MCT OIL) oil Take by mouth daily.    . medroxyPROGESTERone (DEPO-PROVERA) 150 MG/ML injection Inject 1 mL (150 mg total) into the muscle every 3 (three) months. 1 mL 1  . metroNIDAZOLE (METROGEL) 0.75 % vaginal gel Place 1 Applicatorful vaginally at bedtime for 5 days. 50 g 0  . Multiple Vitamin (MULTIVITAMIN) LIQD Take 5 mLs by mouth daily. Uses essential oil Lifelong Vitality vitamins    . Na Sulfate-K Sulfate-Mg Sulf 17.5-3.13-1.6 GM/177ML SOLN Take 1 kit by mouth once for 1 dose. 354 mL 0   No current facility-administered medications for this visit.     Patient confirms/reports the following allergies:  Allergies  Allergen Reactions  . Other     Cats  . Pollen Extract     No orders of the defined types were placed in this encounter.   AUTHORIZATION INFORMATION Primary Insurance: 1D#: Group #:  Secondary Insurance: 1D#: Group #:  SCHEDULE  INFORMATION: Date: 09/30/18 Time: Location:[facility]

## 2018-09-30 ENCOUNTER — Other Ambulatory Visit: Payer: Self-pay

## 2018-09-30 ENCOUNTER — Encounter
Admission: RE | Disposition: A | Discharge status: Home / Self Care | Payer: Self-pay | Source: Home / Self Care | Attending: Gastroenterology

## 2018-09-30 ENCOUNTER — Ambulatory Visit: Payer: BC Managed Care – PPO | Admitting: Registered Nurse

## 2018-09-30 ENCOUNTER — Encounter: Payer: Self-pay | Admitting: *Deleted

## 2018-09-30 ENCOUNTER — Ambulatory Visit
Admission: RE | Admit: 2018-09-30 | Discharge: 2018-09-30 | Disposition: A | Discharge status: Home / Self Care | Payer: BC Managed Care – PPO | Attending: Gastroenterology | Admitting: Gastroenterology

## 2018-09-30 DIAGNOSIS — Z79899 Other long term (current) drug therapy: Secondary | ICD-10-CM | POA: Insufficient documentation

## 2018-09-30 DIAGNOSIS — Z793 Long term (current) use of hormonal contraceptives: Secondary | ICD-10-CM | POA: Diagnosis not present

## 2018-09-30 DIAGNOSIS — Z6838 Body mass index (BMI) 38.0-38.9, adult: Secondary | ICD-10-CM | POA: Diagnosis not present

## 2018-09-30 DIAGNOSIS — E669 Obesity, unspecified: Secondary | ICD-10-CM | POA: Diagnosis not present

## 2018-09-30 DIAGNOSIS — Z1211 Encounter for screening for malignant neoplasm of colon: Secondary | ICD-10-CM | POA: Insufficient documentation

## 2018-09-30 DIAGNOSIS — K635 Polyp of colon: Secondary | ICD-10-CM | POA: Insufficient documentation

## 2018-09-30 DIAGNOSIS — K51419 Inflammatory polyps of colon with unspecified complications: Secondary | ICD-10-CM | POA: Diagnosis not present

## 2018-09-30 HISTORY — DX: Essential (primary) hypertension: I10

## 2018-09-30 HISTORY — PX: COLONOSCOPY WITH PROPOFOL: SHX5780

## 2018-09-30 SURGERY — COLONOSCOPY WITH PROPOFOL
Anesthesia: General

## 2018-09-30 MED ORDER — PROPOFOL 10 MG/ML IV BOLUS
INTRAVENOUS | Status: DC | PRN
  Administered 2018-09-30: 70 mg via INTRAVENOUS

## 2018-09-30 MED ORDER — PROPOFOL 500 MG/50ML IV EMUL
INTRAVENOUS | Status: DC | PRN
  Administered 2018-09-30: 140 ug/kg/min via INTRAVENOUS

## 2018-09-30 MED ORDER — SODIUM CHLORIDE 0.9 % IV SOLN
INTRAVENOUS | Status: DC
  Administered 2018-09-30: 10:00:00 1000 mL via INTRAVENOUS

## 2018-09-30 NOTE — Op Note (Signed)
[facility] Gastroenterology Patient Name: Cassidy Kelly Procedure Date: 09/30/2018 10:33 AM MRN: [id number] Account #: [id number] Date of Birth: 50-Aug-1972 Admit Type: Outpatient Age: 48 [facility] 4 Gender: Female Note Status: Finalized Procedure:            Colonoscopy Indications:          Screening for colorectal malignant neoplasm Providers:            Wyline MoodKiran Dorsie Burich MD, MD Referring MD:         Buckner MaltaJennifer Burgart (Referring MD) Medicines:            Monitored Anesthesia Care Complications:        No immediate complications. Procedure:            Pre-Anesthesia Assessment:                       - Prior to the procedure, a History and Physical was                        performed, and patient medications, allergies and                        sensitivities were reviewed. The patient's tolerance of                        previous anesthesia was reviewed.                       - The risks and benefits of the procedure and the                        sedation options and risks were discussed with the                        patient. All questions were answered and informed                        consent was obtained.                       - ASA Grade Assessment: II - A patient with mild                        systemic disease.                       After obtaining informed consent, the colonoscope was                        passed under direct vision. Throughout the procedure,                        the patient's blood pressure, pulse, and oxygen                        saturations were monitored continuously. The                        Colonoscope was introduced through the anus and  advanced to the the cecum, identified by the                        appendiceal orifice, IC valve and transillumination.                        The colonoscopy was performed with ease. The patient                        tolerated the procedure well.  The quality of the bowel                        preparation was good. Findings:      The perianal and digital rectal examinations were normal.      A 3 mm polyp was found in the transverse colon. The polyp was sessile.       The polyp was removed with a cold snare. Resection was complete, but the       polyp tissue was not retrieved.      The exam was otherwise without abnormality on direct and retroflexion       views. Impression:           - One 3 mm polyp in the transverse colon, removed with                        a cold snare. Complete resection. Polyp tissue not                        retrieved.                       - The examination was otherwise normal on direct and                        retroflexion views. Recommendation:       - Discharge patient to home (with escort).                       - Resume previous diet.                       - Continue present medications.                       - Repeat colonoscopy in 7 years for surveillance. Procedure Code(s):    --- Professional ---                       [id number], Colonoscopy, flexible; with removal of tumor(s),                        polyp(s), or other lesion(s) by snare technique Diagnosis Code(s):    --- Professional ---                       Z12.11, Encounter for screening for malignant neoplasm                        of colon                       K63.5, Polyp of colon CPT copyright 2019 American  Medical Association. All rights reserved. The codes documented in this report are preliminary and upon coder review may  be revised to meet current compliance requirements. Wyline MoodKiran Randy Castrejon, MD Wyline MoodKiran Gillian Kluever MD, MD 09/30/2018 10:57:01 AM This report has been signed electronically. Number of Addenda: 0 Note Initiated On: 09/30/2018 10:33 AM Scope Withdrawal Time: 0 hours 11 minutes 38 seconds  Total Procedure Duration: 0 hours 15 minutes 30 seconds  Estimated Blood Loss: Estimated blood loss: none.      [facility]

## 2018-09-30 NOTE — Anesthesia Preprocedure Evaluation (Signed)
Anesthesia Evaluation  Patient identified by MRN, date of birth, ID band Patient awake    Reviewed: Allergy & Precautions, H&P , NPO status , Patient's Chart, lab work & pertinent test results, reviewed documented beta blocker date and time   Airway Mallampati: II   Neck ROM: full    Dental  (+) Poor Dentition   Pulmonary neg pulmonary ROS,    Pulmonary exam normal        Cardiovascular Exercise Tolerance: Good hypertension, On Medications negative cardio ROS Normal cardiovascular exam Rhythm:regular Rate:Normal     Neuro/Psych negative neurological ROS  negative psych ROS   GI/Hepatic negative GI ROS, Neg liver ROS,   Endo/Other  negative endocrine ROS  Renal/GU negative Renal ROS  negative genitourinary   Musculoskeletal   Abdominal   Peds  Hematology negative hematology ROS (+)   Anesthesia Other Findings Past Medical History: No date: Allergy 06/22/1990: Herpes II No date: Hypercholesterolemia No date: Hypertension No date: Obesity (BMI 30-39.9) Past Surgical History: No date: WISDOM TOOTH EXTRACTION     Comment:  AGE 1; ALL FOUR BMI    Body Mass Index: 38.59 kg/m     Reproductive/Obstetrics negative OB ROS                             Anesthesia Physical Anesthesia Plan  ASA: III  Anesthesia Plan: General   Post-op Pain Management:    Induction:   PONV Risk Score and Plan:   Airway Management Planned:   Additional Equipment:   Intra-op Plan:   Post-operative Plan:   Informed Consent: I have reviewed the patients History and Physical, chart, labs and discussed the procedure including the risks, benefits and alternatives for the proposed anesthesia with the patient or authorized representative who has indicated his/her understanding and acceptance.     Dental Advisory Given  Plan Discussed with: CRNA  Anesthesia Plan Comments:         Anesthesia  Quick Evaluation

## 2018-09-30 NOTE — H&P (Signed)
Tahjae Durr, MD [address], [address], [place], [place], [id number] [address], [address], [place], [place], [address]Wyline [id number] Phone: [phone]  Fax: [phone]  Primary Care Physician:  Margaretann LovelessBurnette, Jennifer M, PA-C   Pre-Procedure History & Physical: HPI:  Marcelle SmilingShondalette G Covington is a 48 y.o. female is here for an colonoscopy.   Past Medical History:  Diagnosis Date  . Allergy   . 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 Hypercholesterolemia   . Obesity (BMI 30-39.9)     Past Surgical History:  Procedure Laterality Date  . WISDOM TOOTH EXTRACTION     AGE 48; ALL FOUR    Prior to Admission medications   Medication Sig Start Date End Date Taking? Authorizing Provider  COLLAGEN PO Take by mouth daily.    [provider]  Cyanocobalamin (B-12 PO) Take by mouth daily.    [provider]  MAGNESIUM CITRATE PO Take by mouth daily.    [provider]  medium chain triglycerides (MCT OIL) oil Take by mouth daily.    [provider]  medroxyPROGESTERone (DEPO-PROVERA) 150 MG/ML injection Inject 1 mL (150 mg total) into the muscle every 3 (three) months. 09/23/18   Farrel ConnersGutierrez, Colleen, CNM  Multiple Vitamin (MULTIVITAMIN) LIQD Take 5 mLs by mouth daily. Uses essential oil Lifelong Vitality vitamins    [provider]    Allergies as of 09/26/2018 - Review Complete 09/23/2018  Allergen Reaction Noted  . Other  08/08/2017  . Pollen extract  08/08/2017    Family History  Problem Relation Age of Onset  . Healthy Mother   . Diabetes Maternal Grandmother   . Heart disease Paternal Grandfather        MIs in 6050s    Social History   Socioeconomic History  . Marital status: Divorced    Spouse name: Not on file  . Number of children: 1  . Years of education: 4516  . Highest education level: Not on file  Occupational History  . Occupation: Pension scheme managerpecial Education Teacher  Social Needs  . Financial resource strain: Not on file  . Food insecurity    Worry:  Not on file    Inability: Not on file  . Transportation needs    Medical: Not on file    Non-medical: Not on file  Tobacco Use  . Smoking status: Never Smoker  . Smokeless tobacco: Never Used  Substance and Sexual Activity  . Alcohol use: Yes    Comment: OCC  . Drug use: No  . Sexual activity: Not Currently    Partners: Male    Birth control/protection: Injection    Comment: DEPO  Lifestyle  . Physical activity    Days per week: 5 days    Minutes per session: 60 min  . Stress: Rather much  Relationships  . Social Musicianconnections    Talks on phone: Not on file    Gets together: Not on file    Attends religious service: Not on file    Active member of club or organization: Not on file    Attends meetings of clubs or organizations: Not on file    Relationship status: Not on file  . Intimate partner violence    Fear of current or ex partner: Not on file    Emotionally abused: Not on file    Physically abused: Not on file    Forced sexual activity: Not on file  Other Topics Concern  . Not on file  Social History Narrative  .  Not on file    Review of Systems: See HPI, otherwise negative ROS  Physical Exam: There were no vitals taken for this visit. General:   Alert,  pleasant and cooperative in NAD Head:  Normocephalic and atraumatic. Neck:  Supple; no masses or thyromegaly. Lungs:  Clear throughout to auscultation, normal respiratory effort.    Heart:  +S1, +S2, Regular rate and rhythm, No edema. Abdomen:  Soft, nontender and nondistended. Normal bowel sounds, without guarding, and without rebound.   Neurologic:  Alert and  oriented x4;  grossly normal neurologically.  Impression/Plan: KAYLA DESHAIES is here for an colonoscopy to be performed for Screening colonoscopy average risk   Risks, benefits, limitations, and alternatives regarding  colonoscopy have been reviewed with the patient.  Questions have been answered.  All parties agreeable.   Jonathon Bellows, MD   09/30/2018, 9:59 AM

## 2018-09-30 NOTE — Anesthesia Post-op Follow-up Note (Signed)
Anesthesia QCDR form completed.        

## 2018-09-30 NOTE — Transfer of Care (Signed)
Immediate Anesthesia Transfer of Care Note  Patient: [place]  Procedure(s) Performed: COLONOSCOPY WITH PROPOFOL (N/A )  Patient Location: PACU  Anesthesia Type:General  Level of Consciousness: sedated  Airway & Oxygen Therapy: Patient Spontanous Breathing and Patient connected to nasal cannula oxygen  Post-op Assessment: Report given to RN and Post -op Vital signs reviewed and stable  Post vital signs: Reviewed and stable  Last Vitals:  Vitals Value Taken Time  BP 123/80 09/30/18 1101  Temp 36.8 C 09/30/18 1101  Pulse 73 09/30/18 1101  Resp 25 09/30/18 1101  SpO2 99 % 09/30/18 1101  Vitals shown include unvalidated device data.  Last Pain:  Vitals:   09/30/18 1101  TempSrc:   PainSc: 0-No pain         Complications: No apparent anesthesia complications

## 2018-10-01 ENCOUNTER — Encounter: Payer: Self-pay | Admitting: Gastroenterology

## 2018-10-01 ENCOUNTER — Encounter: Payer: Self-pay | Admitting: Certified Nurse Midwife

## 2018-10-01 LAB — IGP, APTIMA HPV: HPV Aptima: NEGATIVE

## 2018-10-01 NOTE — Anesthesia Postprocedure Evaluation (Signed)
Anesthesia Post Note  Patient: [place]  Procedure(s) Performed: COLONOSCOPY WITH PROPOFOL (N/A )  Patient location during evaluation: PACU Anesthesia Type: General Level of consciousness: awake and alert Pain management: pain level controlled Vital Signs Assessment: post-procedure vital signs reviewed and stable Respiratory status: spontaneous breathing, nonlabored ventilation, respiratory function stable and patient connected to nasal cannula oxygen Cardiovascular status: blood pressure returned to baseline and stable Postop Assessment: no apparent nausea or vomiting Anesthetic complications: no     Last Vitals:  Vitals:   09/30/18 1109 09/30/18 1119  BP: 137/83 130/86  Pulse: 63 69  Resp: 12 20  Temp:    SpO2: 100% 100%    Last Pain:  Vitals:   10/01/18 0723  TempSrc:   PainSc: 0-No pain                 Molli Barrows

## 2018-10-23 ENCOUNTER — Other Ambulatory Visit: Payer: Self-pay

## 2018-10-23 ENCOUNTER — Ambulatory Visit (INDEPENDENT_AMBULATORY_CARE_PROVIDER_SITE_OTHER): Payer: BC Managed Care – PPO

## 2018-10-23 DIAGNOSIS — Z3042 Encounter for surveillance of injectable contraceptive: Secondary | ICD-10-CM

## 2018-10-23 MED ORDER — MEDROXYPROGESTERONE ACETATE 150 MG/ML IM SUSP
150.0000 mg | Freq: Once | INTRAMUSCULAR | Status: AC
  Administered 2018-10-23: 150 mg via INTRAMUSCULAR

## 2018-10-23 NOTE — Progress Notes (Signed)
Pt presents today for Depo Provera injection within dates. Given IM RUOQ. Pt tolerated well.

## 2018-12-19 ENCOUNTER — Ambulatory Visit: Payer: BC Managed Care – PPO | Admitting: Physician Assistant

## 2018-12-19 ENCOUNTER — Other Ambulatory Visit: Payer: Self-pay

## 2018-12-19 ENCOUNTER — Encounter: Payer: Self-pay | Admitting: Physician Assistant

## 2018-12-19 VITALS — BP 164/90 | HR 81 | Temp 96.8°F | Resp 16 | Ht 62.0 in | Wt 220.0 lb

## 2018-12-19 DIAGNOSIS — I10 Essential (primary) hypertension: Secondary | ICD-10-CM | POA: Diagnosis not present

## 2018-12-19 DIAGNOSIS — Z6841 Body Mass Index (BMI) 40.0 and over, adult: Secondary | ICD-10-CM | POA: Diagnosis not present

## 2018-12-19 MED ORDER — AMLODIPINE BESYLATE 5 MG PO TABS: 5.0000 mg | ORAL_TABLET | Freq: Every day | ORAL | 0 refills | Status: DC

## 2018-12-19 NOTE — Progress Notes (Signed)
Patient: Cassidy Kelly Female    DOB: [birth year]   48 y.o.   MRN: [id number] Visit Date: 12/25/2018  Today's Provider: Mar Daring, PA-C   Chief Complaint  Patient presents with  . Hypertension   Subjective:     HPI   Elevated blood pressure reading From 04/17/2018-Advised to check BP at home, start DASH dieting habits and we will address in the next coming weeks if BP still elevated at home. She is to send a Pharmacist, community message with BP readings.   Patient has been checking her blood pressure every morning for the past 2 weeks. Patient states she checked her bp 2 days ago at work and it was 196/111-186/96. Patient states her bp for the past 2 weeks has been averaging 160/80.    Allergies  Allergen Reactions  . Other     Cats  . Pollen Extract      Current Outpatient Medications:  .  COLLAGEN PO, Take by mouth daily., Disp: , Rfl:  .  Cyanocobalamin (B-12 PO), Take by mouth daily., Disp: , Rfl:  .  MAGNESIUM CITRATE PO, Take by mouth daily., Disp: , Rfl:  .  medroxyPROGESTERone (DEPO-PROVERA) 150 MG/ML injection, Inject 1 mL (150 mg total) into the muscle every 3 (three) months., Disp: 1 mL, Rfl: 1 .  Multiple Vitamin (MULTIVITAMIN) LIQD, Take 5 mLs by mouth daily. Uses essential oil Lifelong Vitality vitamins, Disp: , Rfl:  .  TURMERIC PO, Take by mouth., Disp: , Rfl:  .  amLODipine (NORVASC) 5 MG tablet, Take 1 tablet (5 mg total) by mouth daily., Disp: 90 tablet, Rfl: 0 .  medium chain triglycerides (MCT OIL) oil, Take by mouth daily., Disp: , Rfl:   Review of Systems  Constitutional: Negative for appetite change, chills, fatigue and fever.  Eyes: Negative for visual disturbance.  Respiratory: Negative for chest tightness and shortness of breath.   Cardiovascular: Negative for chest pain and palpitations.  Gastrointestinal: Negative for abdominal pain, nausea and vomiting.  Neurological: Positive for headaches. Negative for dizziness and weakness.     Social History   Tobacco Use  . Smoking status: Never Smoker  . Smokeless tobacco: Never Used  Substance Use Topics  . Alcohol use: Yes    Comment: OCC      Objective:   BP (!) 164/90 (BP Location: Right Arm, Cuff Size: Large)   Pulse 81   Temp (!) 96.8 F (36 C) (Other (Comment))   Resp 16   Ht 5\' 2"  (1.575 m)   Wt 220 lb (99.8 kg)   SpO2 98%   BMI 40.24 kg/m  Vitals:   12/19/18 1549 12/19/18 1610  BP: (!) 159/95 (!) 164/90  Pulse: 81   Resp: 16   Temp: (!) 96.8 F (36 C)   TempSrc: Other (Comment)   SpO2: 98%   Weight: 220 lb (99.8 kg)   Height: 5\' 2"  (1.575 m)   Body mass index is 40.24 kg/m.   Physical Exam Vitals signs reviewed.  Constitutional:      General: She is not in acute distress.    Appearance: Normal appearance. She is well-developed. She is obese. She is not ill-appearing or diaphoretic.  Neck:     Musculoskeletal: Normal range of motion and neck supple.     Thyroid: No thyromegaly.     Vascular: No JVD.     Trachea: No tracheal deviation.  Cardiovascular:     Rate and Rhythm: Normal rate and regular  rhythm.     Pulses: Normal pulses.     Heart sounds: Normal heart sounds. No murmur. No friction rub. No gallop.   Pulmonary:     Effort: Pulmonary effort is normal. No respiratory distress.     Breath sounds: Normal breath sounds. No wheezing or rales.  Lymphadenopathy:     Cervical: No cervical adenopathy.  Neurological:     Mental Status: She is alert.      No results found for any visits on 12/19/18.     Assessment & Plan    1. Essential hypertension Elevated. Will start amlodipine as below. I will see her back in 4-6 weeks to recheck and get labs.  - amLODipine (NORVASC) 5 MG tablet; Take 1 tablet (5 mg total) by mouth daily.  Dispense: 90 tablet; Refill: 0  2. Class 3 severe obesity due to excess calories with serious comorbidity and body mass index (BMI) of 40.0 to 44.9 in adult [facility]) Counseled patient on healthy lifestyle  modifications including dieting and exercise.      Margaretann Loveless, PA-C  [facility]

## 2018-12-19 NOTE — Patient Instructions (Signed)
Amlodipine tablets What is this medicine? AMLODIPINE (am LOE di peen) is a calcium-channel blocker. It affects the amount of calcium found in your heart and muscle cells. This relaxes your blood vessels, which can reduce the amount of work the heart has to do. This medicine is used to lower high blood pressure. It is also used to prevent chest pain. This medicine may be used for other purposes; ask your health care provider or pharmacist if you have questions. COMMON BRAND NAME(S): Norvasc What should I tell my health care provider before I take this medicine? They need to know if you have any of these conditions:  heart disease  liver disease  an unusual or allergic reaction to amlodipine, other medicines, foods, dyes, or preservatives  pregnant or trying to get pregnant  breast-feeding How should I use this medicine? Take this medicine by mouth with a glass of water. Follow the directions on the prescription label. You can take it with or without food. If it upsets your stomach, take it with food. Take your medicine at regular intervals. Do not take it more often than directed. Do not stop taking except on your doctor's advice. Talk to your pediatrician regarding the use of this medicine in children. While this drug may be prescribed for children as young as 6 years for selected conditions, precautions do apply. Patients over 65 years of age may have a stronger reaction and need a smaller dose. Overdosage: If you think you have taken too much of this medicine contact a poison control center or emergency room at once. NOTE: This medicine is only for you. Do not share this medicine with others. What if I miss a dose? If you miss a dose, take it as soon as you can. If it is almost time for your next dose, take only that dose. Do not take double or extra doses. What may interact with this medicine? Do not take this medicine with any of the following medications:  tranylcypromine This  medicine may also interact with the following medications:  clarithromycin  cyclosporine  diltiazem  itraconazole  simvastatin  tacrolimus This list may not describe all possible interactions. Give your health care provider a list of all the medicines, herbs, non-prescription drugs, or dietary supplements you use. Also tell them if you smoke, drink alcohol, or use illegal drugs. Some items may interact with your medicine. What should I watch for while using this medicine? Visit your healthcare professional for regular checks on your progress. Check your blood pressure as directed. Ask your healthcare professional what your blood pressure should be and when you should contact him or her. Do not treat yourself for coughs, colds, or pain while you are using this medicine without asking your healthcare professional for advice. Some medicines may increase your blood pressure. You may get dizzy. Do not drive, use machinery, or do anything that needs mental alertness until you know how this medicine affects you. Do not stand or sit up quickly, especially if you are an older patient. This reduces the risk of dizzy or fainting spells. Avoid alcoholic drinks; they can make you dizzier. What side effects may I notice from receiving this medicine? Side effects that you should report to your doctor or health care professional as soon as possible:  allergic reactions like skin rash, itching or hives; swelling of the face, lips, or tongue  fast, irregular heartbeat  signs and symptoms of low blood pressure like dizziness; feeling faint or lightheaded, falls; unusually weak   or tired  swelling of ankles, feet, hands Side effects that usually do not require medical attention (report these to your doctor or health care professional if they continue or are bothersome):  dry mouth  facial flushing  headache  stomach pain  tiredness This list may not describe all possible side effects. Call your  doctor for medical advice about side effects. You may report side effects to FDA at 1-800-FDA-1088. Where should I keep my medicine? Keep out of the reach of children. Store at room temperature between 59 and 86 degrees F (15 and 30 degrees C). Throw away any unused medicine after the expiration date. NOTE: This sheet is a summary. It may not cover all possible information. If you have questions about this medicine, talk to your doctor, pharmacist, or health care provider.  2020 Elsevier/Gold Standard (2017-10-05 15:07:10)  

## 2019-01-15 ENCOUNTER — Ambulatory Visit (INDEPENDENT_AMBULATORY_CARE_PROVIDER_SITE_OTHER): Payer: BC Managed Care – PPO

## 2019-01-15 ENCOUNTER — Other Ambulatory Visit: Payer: Self-pay

## 2019-01-15 DIAGNOSIS — Z3042 Encounter for surveillance of injectable contraceptive: Secondary | ICD-10-CM

## 2019-01-15 MED ORDER — MEDROXYPROGESTERONE ACETATE 150 MG/ML IM SUSP
150.0000 mg | Freq: Once | INTRAMUSCULAR | Status: AC
  Administered 2019-01-15: 16:00:00 150 mg via INTRAMUSCULAR

## 2019-01-15 NOTE — Progress Notes (Signed)
Pt presents today for Depo Provera injection within dates. Given IM LUOQ. Pt tolerated well.

## 2019-02-06 ENCOUNTER — Ambulatory Visit: Payer: Self-pay | Admitting: Physician Assistant

## 2019-02-26 ENCOUNTER — Encounter: Payer: Self-pay | Admitting: Physician Assistant

## 2019-02-26 ENCOUNTER — Telehealth (INDEPENDENT_AMBULATORY_CARE_PROVIDER_SITE_OTHER): Payer: BC Managed Care – PPO | Admitting: Physician Assistant

## 2019-02-26 VITALS — BP 180/98

## 2019-02-26 DIAGNOSIS — I10 Essential (primary) hypertension: Secondary | ICD-10-CM | POA: Diagnosis not present

## 2019-02-26 NOTE — Progress Notes (Signed)
Patient: Cassidy Kelly Female    DOB: 05-06-1970   48 y.o.   MRN: [id number] Visit Date: 02/26/2019  Today's Provider: Mar Daring, PA-C   Chief Complaint  Patient presents with  . Hypertension   Subjective:    Virtual Visit via Video Note  I connected with Cassidy Kelly on 02/26/19 at  3:40 PM EST by a video enabled telemedicine application and verified that I am speaking with the correct person using two identifiers.  Location: Patient: Work Provider: [facility]   I discussed the limitations of evaluation and management by telemedicine and the availability of in person appointments. The patient expressed understanding and agreed to proceed.  HPI  Hypertension, follow-up:  BP Readings from Last 3 Encounters:  02/26/19 (!) 180/98  12/19/18 (!) 164/90  09/30/18 130/86    She was last seen for hypertension 2 months ago.  BP at that visit was 164/90. Management changes since that visit include started Amlodipine 5 mg. She reports good compliance with treatment. She is not having side effects.  She is not exercising. She is not adherent to low salt diet.   Outside blood pressures are being checked at home, and are still elevated. She is experiencing none.  Patient denies chest pain, chest pressure/discomfort, irregular heart beat and palpitations.   Cardiovascular risk factors include dyslipidemia and hypertension.  Use of agents associated with hypertension: none.     Weight trend: stable Wt Readings from Last 3 Encounters:  12/19/18 220 lb (99.8 kg)  09/30/18 211 lb (95.7 kg)  09/23/18 212 lb (96.2 kg)   ------------------------------------------------------------------------  Allergies  Allergen Reactions  . Other     Cats  . Pollen Extract      Current Outpatient Medications:  .  amLODipine (NORVASC) 5 MG tablet, Take 1 tablet (5 mg total) by mouth daily., Disp: 90 tablet, Rfl: 0 .  COLLAGEN PO, Take by mouth daily., Disp: , Rfl:  .   Cyanocobalamin (B-12 PO), Take by mouth daily., Disp: , Rfl:  .  MAGNESIUM CITRATE PO, Take by mouth daily., Disp: , Rfl:  .  medium chain triglycerides (MCT OIL) oil, Take by mouth daily., Disp: , Rfl:  .  medroxyPROGESTERone (DEPO-PROVERA) 150 MG/ML injection, Inject 1 mL (150 mg total) into the muscle every 3 (three) months., Disp: 1 mL, Rfl: 1 .  Multiple Vitamin (MULTIVITAMIN) LIQD, Take 5 mLs by mouth daily. Uses essential oil Lifelong Vitality vitamins, Disp: , Rfl:  .  TURMERIC PO, Take by mouth., Disp: , Rfl:   Review of Systems  Constitutional: Negative.   Eyes: Negative for visual disturbance.  Respiratory: Negative.   Cardiovascular: Negative.   Musculoskeletal: Negative.   Neurological: Positive for headaches.    Social History   Tobacco Use  . Smoking status: Never Smoker  . Smokeless tobacco: Never Used  Substance Use Topics  . Alcohol use: Yes    Comment: OCC      Objective:   BP (!) 180/98 (BP Location: Right Arm, Patient Position: Sitting, Cuff Size: Normal)  Vitals:   02/26/19 1123  BP: (!) 180/98  There is no height or weight on file to calculate BMI.  Physical Exam: WDWN obese female in no apparent distress Head: Normocephalic, atraumatic Lungs: No apparent respiratory distress Mood: Normal mood and affect   No results found for any visits on 02/26/19.     Assessment and Plan:  1. Essential hypertension Still elevated but tolerating medication well. Increase dose  to 10mg  as below. Will f/u in 4 weeks.  - amLODipine (NORVASC) 10 MG tablet; Take 1 tablet (10 mg total) by mouth daily.  Dispense: 90 tablet; Refill: 0    I discussed the assessment and treatment plan with the patient. The patient was provided an opportunity to ask questions and all were answered. The patient agreed with the plan and demonstrated an understanding of the instructions.   The patient was advised to call back or seek an in-person evaluation if the symptoms worsen or if  the condition fails to improve as anticipated.  I provided 12 minutes of non-face-to-face time during this encounter.     , PA-C  [facility]

## 2019-02-28 ENCOUNTER — Encounter: Payer: Self-pay | Admitting: Physician Assistant

## 2019-02-28 MED ORDER — AMLODIPINE BESYLATE 10 MG PO TABS: 10.0000 mg | ORAL_TABLET | Freq: Every day | ORAL | 0 refills | Status: DC

## 2019-02-28 NOTE — Patient Instructions (Signed)

## 2019-03-16 ENCOUNTER — Other Ambulatory Visit: Payer: Self-pay | Admitting: Physician Assistant

## 2019-03-16 DIAGNOSIS — I10 Essential (primary) hypertension: Secondary | ICD-10-CM

## 2019-04-05 ENCOUNTER — Other Ambulatory Visit: Payer: Self-pay | Admitting: Certified Nurse Midwife

## 2019-04-09 ENCOUNTER — Ambulatory Visit (INDEPENDENT_AMBULATORY_CARE_PROVIDER_SITE_OTHER): Payer: BC Managed Care – PPO

## 2019-04-09 ENCOUNTER — Other Ambulatory Visit: Payer: Self-pay

## 2019-04-09 DIAGNOSIS — Z3042 Encounter for surveillance of injectable contraceptive: Secondary | ICD-10-CM

## 2019-04-09 MED ORDER — MEDROXYPROGESTERONE ACETATE 150 MG/ML IM SUSP
150.0000 mg | Freq: Once | INTRAMUSCULAR | Status: AC
  Administered 2019-04-09: 150 mg via INTRAMUSCULAR

## 2019-04-09 NOTE — Progress Notes (Signed)
Pt here for depo which was given IM right glut.  NDC# [address]-1 

## 2019-05-25 ENCOUNTER — Ambulatory Visit: Payer: BC Managed Care – PPO | Attending: Physician Assistant

## 2019-05-25 DIAGNOSIS — Z23 Encounter for immunization: Secondary | ICD-10-CM | POA: Insufficient documentation

## 2019-05-25 NOTE — Progress Notes (Signed)
   Covid-19 Vaccination Clinic  Name:  Cassidy Kelly    MRN: [id number] DOB: July 03, 1970  05/25/2019  Ms. North was observed post Covid-19 immunization for 15 minutes without incidence. She was provided with Vaccine Information Sheet and instruction to access the V-Safe system.   [name] was instructed to call 911 with any severe reactions post vaccine: Marland Kitchen Difficulty breathing  . Swelling of your face and throat  . A fast heartbeat  . A bad rash all over your body  . Dizziness and weakness    Immunizations Administered    Name Date Dose VIS Date Route   Pfizer COVID-19 Vaccine 05/25/2019 12:24 PM 0.3 mL 03/07/2019 Intramuscular   Manufacturer: [employer], [employer]   Lot: [id number]   NDC: [address]-9

## 2019-05-27 ENCOUNTER — Other Ambulatory Visit: Payer: Self-pay | Admitting: Physician Assistant

## 2019-05-27 DIAGNOSIS — I10 Essential (primary) hypertension: Secondary | ICD-10-CM

## 2019-06-17 ENCOUNTER — Ambulatory Visit: Payer: BC Managed Care – PPO | Attending: Internal Medicine

## 2019-06-17 DIAGNOSIS — Z23 Encounter for immunization: Secondary | ICD-10-CM

## 2019-06-17 NOTE — Progress Notes (Signed)
   Covid-19 Vaccination Clinic  Name:  ENORA TRILLO    MRN: [id number] DOB: 1970-11-01  06/17/2019  Ms. Keel was observed post Covid-19 immunization for 15 minutes without incident. She was provided with Vaccine Information Sheet and instruction to access the V-Safe system.   Ms. Iribe was instructed to call 911 with any severe reactions post vaccine: Marland Kitchen Difficulty breathing  . Swelling of face and throat  . A fast heartbeat  . A bad rash all over body  . Dizziness and weakness   Immunizations Administered    Name Date Dose VIS Date Route   Pfizer COVID-19 Vaccine 06/17/2019  2:43 PM 0.3 mL 03/07/2019 Intramuscular   Manufacturer: [employer], [employer]   Lot: [id number]   NDC: [address]-1

## 2019-06-25 ENCOUNTER — Ambulatory Visit: Payer: BC Managed Care – PPO

## 2019-06-30 ENCOUNTER — Ambulatory Visit (INDEPENDENT_AMBULATORY_CARE_PROVIDER_SITE_OTHER): Payer: BC Managed Care – PPO

## 2019-06-30 ENCOUNTER — Other Ambulatory Visit: Payer: Self-pay

## 2019-06-30 DIAGNOSIS — Z3042 Encounter for surveillance of injectable contraceptive: Secondary | ICD-10-CM

## 2019-06-30 MED ORDER — MEDROXYPROGESTERONE ACETATE 150 MG/ML IM SUSP
150.0000 mg | Freq: Once | INTRAMUSCULAR | Status: AC
  Administered 2019-06-30: 150 mg via INTRAMUSCULAR

## 2019-07-22 NOTE — Progress Notes (Signed)
Established patient visit   Patient: Cassidy Kelly   DOB: September 13, 1970   49 y.o. Female  MRN: [id number] Visit Date: 07/24/2019  Today's healthcare provider: Margaretann Loveless, PA-C   Chief Complaint  Patient presents with  . Cyst   Subjective    HPI  Patient here with c/o noticing a nodule/lymph node under her chin since Tuesday. Denies pain or tenderness. Repors that is moveable. She reports that she has had some allergies.  Patient Active Problem List   Diagnosis Date Noted  . Hypercholesterolemia 08/08/2017  . Essential hypertension 08/08/2017  . Vertigo 07/17/2017  . AOM (acute otitis media) 06/24/2015  . Allergic rhinitis 06/24/2015   Past Medical History:  Diagnosis Date  . Allergy   . 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 Hypercholesterolemia   . Hypertension   . Obesity (BMI 30-39.9)        Medications: Outpatient Medications Prior to Visit  Medication Sig  . amLODipine (NORVASC) 10 MG tablet TAKE 1 TABLET(10 MG) BY MOUTH DAILY  . COLLAGEN PO Take by mouth daily.  . medroxyPROGESTERone (DEPO-PROVERA) 150 MG/ML injection INJECT 1 ML INTO THE MUSCLE EVERY 3 MONTHS  . Multiple Vitamin (MULTIVITAMIN) LIQD Take 5 mLs by mouth daily. Uses essential oil Lifelong Vitality vitamins  . TURMERIC PO Take by mouth.  . Cyanocobalamin (B-12 PO) Take by mouth daily.  Marland Kitchen MAGNESIUM CITRATE PO Take by mouth daily.  . medium chain triglycerides (MCT OIL) oil Take by mouth daily.   No facility-administered medications prior to visit.    Review of Systems  Constitutional: Negative.   Respiratory: Negative.   Cardiovascular: Negative.   Skin: Negative.   Neurological: Negative.     Last CBC Lab Results  Component Value Date   WBC 9.3 07/17/2017   HGB 14.1 07/17/2017   HCT 41.3 07/17/2017   MCV 89 07/17/2017   MCH 30.3 07/17/2017   RDW 14.4 07/17/2017   PLT 234 07/17/2017   Last metabolic panel Lab Results  Component Value Date   GLUCOSE 105 (H) 09/24/2017   NA  143 09/24/2017   K 4.1 09/24/2017   CL 109 (H) 09/24/2017   CO2 18 (L) 09/24/2017   BUN 7 09/24/2017   CREATININE 0.90 09/24/2017   GFRNONAA 76 09/24/2017   GFRAA 87 09/24/2017   CALCIUM 8.9 09/24/2017   PROT 7.0 09/24/2017   ALBUMIN 4.4 09/24/2017   LABGLOB 2.6 09/24/2017   AGRATIO 1.7 09/24/2017   BILITOT 0.3 09/24/2017   ALKPHOS 73 09/24/2017   AST 21 09/24/2017   ALT 19 09/24/2017   ANIONGAP 10 06/14/2017       Objective    BP (!) 149/76 (BP Location: Left Arm, Patient Position: Sitting, Cuff Size: Large) Comment: 132/86 this morning when she checked  Pulse (!) 49 Comment: manually and with pulse ox jumps around  Temp (!) 97.2 F (36.2 C) (Temporal)   Resp 16   Wt 192 lb (87.1 kg)   BMI 35.12 kg/m  BP Readings from Last 3 Encounters:  07/24/19 (!) 149/76  02/26/19 (!) 180/98  12/19/18 (!) 164/90   Wt Readings from Last 3 Encounters:  07/24/19 192 lb (87.1 kg)  12/19/18 220 lb (99.8 kg)  09/30/18 211 lb (95.7 kg)      Physical Exam Vitals reviewed.  Constitutional:      General: She is not in acute distress.    Appearance: Normal appearance. She is well-developed. She is obese. She is not ill-appearing or diaphoretic.  Neck:     Thyroid: No thyromegaly.     Vascular: No carotid bruit or JVD.     Trachea: No tracheal deviation.   Cardiovascular:     Rate and Rhythm: Normal rate and regular rhythm.     Heart sounds: Normal heart sounds. No murmur. No friction rub. No gallop.   Pulmonary:     Effort: Pulmonary effort is normal. No respiratory distress.     Breath sounds: Normal breath sounds. No wheezing or rales.  Musculoskeletal:     Cervical back: Normal range of motion and neck supple. No tenderness.  Lymphadenopathy:     Cervical: No cervical adenopathy.  Neurological:     Mental Status: She is alert.       No results found for any visits on 07/24/19.  Assessment & Plan    1. Essential hypertension Stable. Normal home readings. Continue  Amlodipine 10mg . Will check labs as below and f/u pending results. - CBC w/Diff/Platelet - Comprehensive Metabolic Panel (CMET) - Lipid Panel With LDL/HDL Ratio - HgB A1c - TSH  2. Hypercholesterolemia Diet controlled. Will check labs as below and f/u pending results. - CBC w/Diff/Platelet - Comprehensive Metabolic Panel (CMET) - Lipid Panel With LDL/HDL Ratio - HgB A1c - TSH  3. Class 2 severe obesity due to excess calories with serious comorbidity and body mass index (BMI) of 35.0 to 35.9 in adult [facility]) Counseled patient on healthy lifestyle modifications including dieting and exercise. Doing very well. Has lost 30 pounds since September. Has been doing intermittent fasting. Will check labs as below and f/u pending results. - CBC w/Diff/Platelet - Comprehensive Metabolic Panel (CMET) - Lipid Panel With LDL/HDL Ratio - HgB A1c - TSH  4. Cyst of jaw Noted. Suspect cyst instead of lymph node as it is non-tender. I feel it is more prominent due to weight loss. Will monitor. If it increases in size or changes we will get [place]. She agrees.  No follow-ups on file.      Reynolds Bowl, PA-C, have reviewed all documentation for this visit. The documentation on 07/24/19 for the exam, diagnosis, procedures, and orders are all accurate and complete.   Rubye Beach  [facility] [phone] (phone) [phone] (fax)  [place]

## 2019-07-24 ENCOUNTER — Other Ambulatory Visit: Payer: Self-pay

## 2019-07-24 ENCOUNTER — Encounter: Payer: Self-pay | Admitting: Physician Assistant

## 2019-07-24 ENCOUNTER — Ambulatory Visit: Payer: BC Managed Care – PPO | Admitting: Physician Assistant

## 2019-07-24 VITALS — BP 149/76 | HR 49 | Temp 97.2°F | Resp 16 | Wt 192.0 lb

## 2019-07-24 DIAGNOSIS — E7800 Pure hypercholesterolemia, unspecified: Secondary | ICD-10-CM | POA: Diagnosis not present

## 2019-07-24 DIAGNOSIS — Z6835 Body mass index (BMI) 35.0-35.9, adult: Secondary | ICD-10-CM

## 2019-07-24 DIAGNOSIS — M2740 Unspecified cyst of jaw: Secondary | ICD-10-CM | POA: Diagnosis not present

## 2019-07-24 DIAGNOSIS — I10 Essential (primary) hypertension: Secondary | ICD-10-CM

## 2019-07-24 NOTE — Patient Instructions (Signed)
Epidermal Cyst  An epidermal cyst is a sac made of skin tissue. The sac contains a substance called keratin. Keratin is a protein that is normally secreted through the hair follicles. When keratin becomes trapped in the top layer of skin (epidermis), it can form an epidermal cyst. Epidermal cysts can be found anywhere on your body. These cysts are usually harmless (benign), and they may not cause symptoms unless they become infected. What are the causes? This condition may be caused by:  A blocked hair follicle.  A hair that curls and re-enters the skin instead of growing straight out of the skin (ingrown hair).  A blocked pore.  Irritated skin.  An injury to the skin.  Certain conditions that are passed along from parent to child (inherited).  Human papillomavirus (HPV).  Long-term (chronic) sun damage to the skin. What increases the risk? The following factors may make you more likely to develop an epidermal cyst:  Having acne.  Being overweight.  Being 30-40 years old. What are the signs or symptoms? The only symptom of this condition may be a small, painless lump underneath the skin. When an epidermal cyst ruptures, it may become infected. Symptoms may include:  Redness.  Inflammation.  Tenderness.  Warmth.  Fever.  Keratin draining from the cyst. Keratin is grayish-white, bad-smelling substance.  Pus draining from the cyst. How is this diagnosed? This condition is diagnosed with a physical exam.  In some cases, you may have a sample of tissue (biopsy) taken from your cyst to be examined under a microscope or tested for bacteria.  You may be referred to a health care provider who specializes in skin care (dermatologist). How is this treated? In many cases, epidermal cysts go away on their own without treatment. If a cyst becomes infected, treatment may include:  Opening and draining the cyst, done by a health care provider. After draining, minor surgery to  remove the rest of the cyst may be done.  Antibiotic medicine.  Injections of medicines (steroids) that help to reduce inflammation.  Surgery to remove the cyst. Surgery may be done if the cyst: ? Becomes large. ? Bothers you. ? Has a chance of turning into cancer.  Do not try to open a cyst yourself. Follow these instructions at home:  Take over-the-counter and prescription medicines only as told by your health care provider.  If you were prescribed an antibiotic medicine, take it it as told by your health care provider. Do not stop using the antibiotic even if you start to feel better.  Keep the area around your cyst clean and dry.  Wear loose, dry clothing.  Avoid touching your cyst.  Check your cyst every day for signs of infection. Check for: ? Redness, swelling, or pain. ? Fluid or blood. ? Warmth. ? Pus or a bad smell.  Keep all follow-up visits as told by your health care provider. This is important. How is this prevented?  Wear clean, dry, clothing.  Avoid wearing tight clothing.  Keep your skin clean and dry. Take showers or baths every day. Contact a health care provider if:  Your cyst develops symptoms of infection.  Your condition is not improving or is getting worse.  You develop a cyst that looks different from other cysts you have had.  You have a fever. Get help right away if:  Redness spreads from the cyst into the surrounding area. Summary  An epidermal cyst is a sac made of skin tissue. These cysts are   usually harmless (benign), and they may not cause symptoms unless they become infected.  If a cyst becomes infected, treatment may include surgery to open and drain the cyst, or to remove it. Treatment may also include medicines by mouth or through an injection.  Take over-the-counter and prescription medicines only as told by your health care provider. If you were prescribed an antibiotic medicine, take it as told by your health care  provider. Do not stop using the antibiotic even if you start to feel better.  Contact a health care provider if your condition is not improving or is getting worse.  Keep all follow-up visits as told by your health care provider. This is important. This information is not intended to replace advice given to you by your health care provider. Make sure you discuss any questions you have with your health care provider. Document Revised: 07/04/2018 Document Reviewed: 09/24/2017 Elsevier Patient Education  2020 Elsevier Inc.  

## 2019-07-25 ENCOUNTER — Telehealth: Payer: Self-pay

## 2019-07-25 LAB — CBC WITH DIFFERENTIAL/PLATELET
Basophils Absolute: 0.1 10*3/uL (ref 0.0–0.2)
Basos: 1 %
EOS (ABSOLUTE): 0 10*3/uL (ref 0.0–0.4)
Eos: 0 %
Hematocrit: 37.8 % (ref 34.0–46.6)
Hemoglobin: 12.5 g/dL (ref 11.1–15.9)
Immature Grans (Abs): 0 10*3/uL (ref 0.0–0.1)
Immature Granulocytes: 0 %
Lymphocytes Absolute: 3.7 10*3/uL — ABNORMAL HIGH (ref 0.7–3.1)
Lymphs: 35 %
MCH: 30.1 pg (ref 26.6–33.0)
MCHC: 33.1 g/dL (ref 31.5–35.7)
MCV: 91 fL (ref 79–97)
Monocytes Absolute: 0.6 10*3/uL (ref 0.1–0.9)
Monocytes: 5 %
Neutrophils Absolute: 6.1 10*3/uL (ref 1.4–7.0)
Neutrophils: 59 %
Platelets: 255 10*3/uL (ref 150–450)
RBC: 4.15 x10E6/uL (ref 3.77–5.28)
RDW: 12.7 % (ref 11.7–15.4)
WBC: 10.5 10*3/uL (ref 3.4–10.8)

## 2019-07-25 LAB — COMPREHENSIVE METABOLIC PANEL
ALT: 23 IU/L (ref 0–32)
AST: 25 IU/L (ref 0–40)
Albumin/Globulin Ratio: 1.7 (ref 1.2–2.2)
Albumin: 4.7 g/dL (ref 3.8–4.8)
Alkaline Phosphatase: 82 IU/L (ref 39–117)
BUN/Creatinine Ratio: 11 (ref 9–23)
BUN: 9 mg/dL (ref 6–24)
Bilirubin Total: 0.5 mg/dL (ref 0.0–1.2)
CO2: 21 mmol/L (ref 20–29)
Calcium: 9.2 mg/dL (ref 8.7–10.2)
Chloride: 101 mmol/L (ref 96–106)
Creatinine, Ser: 0.81 mg/dL (ref 0.57–1.00)
GFR calc Af Amer: 98 mL/min/{1.73_m2} (ref >59)
GFR calc non Af Amer: 85 mL/min/{1.73_m2} (ref >59)
Globulin, Total: 2.8 g/dL (ref 1.5–4.5)
Glucose: 86 mg/dL (ref 65–99)
Potassium: 3.4 mmol/L — ABNORMAL LOW (ref 3.5–5.2)
Sodium: 135 mmol/L (ref 134–144)
Total Protein: 7.5 g/dL (ref 6.0–8.5)

## 2019-07-25 LAB — LIPID PANEL WITH LDL/HDL RATIO
Cholesterol, Total: 233 mg/dL — ABNORMAL HIGH (ref 100–199)
HDL: 68 mg/dL (ref >39)
LDL Chol Calc (NIH): 153 mg/dL — ABNORMAL HIGH (ref 0–99)
LDL/HDL Ratio: 2.3 ratio (ref 0.0–3.2)
Triglycerides: 70 mg/dL (ref 0–149)
VLDL Cholesterol Cal: 12 mg/dL (ref 5–40)

## 2019-07-25 LAB — HEMOGLOBIN A1C
Est. average glucose Bld gHb Est-mCnc: 111 mg/dL
Hgb A1c MFr Bld: 5.5 % (ref 4.8–5.6)

## 2019-07-25 LAB — TSH: TSH: 2.51 u[IU]/mL (ref 0.450–4.500)

## 2019-07-25 NOTE — Telephone Encounter (Signed)
-----   Message from Margaretann Loveless, [place] sent at 07/25/2019  9:19 AM EDT ----- Blood count is normal. Kidney and liver function are normal. Sodium and calcium are normal. Potassium is just slightly borderline low. Could add potassium-rich foods like leafy greens, sweet potatoes, or bananas to diet. Cholesterol improved compared to last year. A1c/sugar is normal. Thyroid is normal.

## 2019-07-25 NOTE — Telephone Encounter (Signed)
Patient advised as directed below. 

## 2019-08-26 ENCOUNTER — Other Ambulatory Visit: Payer: Self-pay | Admitting: Physician Assistant

## 2019-08-26 DIAGNOSIS — I10 Essential (primary) hypertension: Secondary | ICD-10-CM

## 2019-09-11 ENCOUNTER — Other Ambulatory Visit: Payer: Self-pay | Admitting: Certified Nurse Midwife

## 2019-09-15 ENCOUNTER — Ambulatory Visit (INDEPENDENT_AMBULATORY_CARE_PROVIDER_SITE_OTHER): Payer: BC Managed Care – PPO

## 2019-09-15 ENCOUNTER — Other Ambulatory Visit: Payer: Self-pay

## 2019-09-15 DIAGNOSIS — Z3042 Encounter for surveillance of injectable contraceptive: Secondary | ICD-10-CM | POA: Diagnosis not present

## 2019-09-15 MED ORDER — MEDROXYPROGESTERONE ACETATE 150 MG/ML IM SUSP
150.0000 mg | Freq: Once | INTRAMUSCULAR | Status: AC
  Administered 2019-09-15: 150 mg via INTRAMUSCULAR

## 2019-09-15 NOTE — Progress Notes (Signed)
Pt here for depo which was given IM right glut.  NDC# 0548-5400-00 

## 2019-09-22 ENCOUNTER — Ambulatory Visit: Payer: BC Managed Care – PPO

## 2019-09-29 NOTE — Progress Notes (Signed)
Gynecology Annual Exam  PCP: Margaretann Loveless, PA-C  Chief Complaint:  Chief Complaint  Patient presents with  . Gynecologic Exam    History of Present Illness Ms. Cassidy Kelly is a 49 y.o. G2P1011 BF who who presents today for her annual examination.  Her menses are absent due to Depo Provera She does not have vasomotor sx.  Since her last annual exam 09/23/2018, she has had no significant changes in her health. Has lost 30# in the past 10 months.   Her medical history is remarkable for hypertension, obesity and hyperlipidemia.   She is divorced and is not sexually active.  Contraception:  Depo. Last dose 09/14/2019  Last Pap: 09/23/2018  Results were: NIL/ negative Remote history of abnormal Pap in her late 52s. 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 Last mammogram: 10/01/2018  Results were: Birads 1 There is no FH of breast cancer. There is no FH of ovarian cancer. The patient does do self-breast exams. Had a colonoscopy 09/30/2018 : normal. Next due in 10 years?  Tobacco use: The patient denies current or previous tobacco use. Alcohol use: occasional glass of wine Exercise: 6x/week She may get adequate calcium  in her diet. Her last cholesterol screen 06/2019  was abnormal (TC 233, HDL 68,  LDL 153) PCP is Joycelyn Man at [facility]. She did have her Pfizer vaccine.   Review of Systems: Review of Systems  Constitutional: Positive for weight loss (intentional). Negative for chills and fever.  HENT: Negative for congestion, sinus pain and sore throat.   Eyes: Negative for blurred vision and pain.  Respiratory: Negative for hemoptysis, shortness of breath and wheezing.   Cardiovascular: Negative for chest pain, palpitations and leg swelling.  Gastrointestinal: Negative for abdominal pain, blood in stool, diarrhea, heartburn, nausea and vomiting.  Genitourinary: Negative for dysuria, frequency, hematuria and urgency.       Positive for  amenorrhea  Musculoskeletal: Negative for back pain, joint pain (left knee) and myalgias.  Skin: Negative for itching and rash.  Neurological: Negative for dizziness, tingling and headaches.  Endo/Heme/Allergies: Positive for environmental allergies. Negative for polydipsia. Does not bruise/bleed easily (easy bruising).       Negative for hirsutism   Psychiatric/Behavioral: Negative for depression. The patient is not nervous/anxious and does not have insomnia.      Past Medical History:  Past Medical History:  Diagnosis Date  . Allergy   . 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 Hypercholesterolemia   . Hypertension   . Obesity (BMI 30-39.9)     Past Surgical History:  Past Surgical History:  Procedure Laterality Date  . COLONOSCOPY WITH PROPOFOL N/A 09/30/2018   Procedure: COLONOSCOPY WITH PROPOFOL;  Surgeon: Wyline Mood, MD;  Location: [facility] ENDOSCOPY;  Service: Gastroenterology;  Laterality: N/A;  . WISDOM TOOTH EXTRACTION     AGE 54; ALL FOUR    Medications:  Current Outpatient Medications:  .  amLODipine (NORVASC) 10 MG tablet, TAKE 1 TABLET(10 MG) BY MOUTH DAILY, Disp: 90 tablet, Rfl: 0 .  Cyanocobalamin (B-12 PO), Take by mouth daily., Disp: , Rfl:  .  medroxyPROGESTERone (DEPO-PROVERA) 150 MG/ML injection, ADMINISTER 1 ML IN THE MUSCLE EVERY 3 MONTHS, Disp: 1 mL, Rfl: 3 .  Multiple Vitamin (MULTIVITAMIN) LIQD, Take 5 mLs by mouth daily. Uses essential oil Lifelong Vitality vitamins, Disp: , Rfl:  .  TURMERIC PO, Take by mouth., Disp: , Rfl:  Allergies:  Allergies  Allergen  Reactions  . Other     Cats  . Pollen Extract      Obstetric History:  OB History  Gravida Para Term Preterm AB Living  2 1 1   1 1   SAB TAB Ectopic Multiple Live Births  1       1    # Outcome Date GA Lbr Len/2nd Weight Sex Delivery Anes PTL Lv  2 Term 09/16/89   7 lb 6 oz (3.345 kg) M Vag-Spont   LIV  1 SAB             Social History:  Social History   Socioeconomic History  . Marital status:  Divorced    Spouse name: Not on file  . Number of children: 1  . Years of education: 56  . Highest education level: Not on file  Occupational History  . Occupation: 12  Tobacco Use  . Smoking status: Never Smoker  . Smokeless tobacco: Never Used  Vaping Use  . Vaping Use: Never used  Substance and Sexual Activity  . Alcohol use: Yes    Comment: OCC  . Drug use: No  . Sexual activity: Not Currently    Partners: Male    Birth control/protection: Injection    Comment: DEPO  Other Topics Concern  . Not on file  Social History Narrative  . Not on file   Social Determinants of Health   Financial Resource Strain:   . Difficulty of Paying Living Expenses:   Food Insecurity:   . Worried About Pension scheme manager in the Last Year:   . Programme researcher, broadcasting/film/video in the Last Year:   Transportation Needs:   . Barista (Medical):   Freight forwarder Lack of Transportation (Non-Medical):   Physical Activity:   . Days of Exercise per Week:   . Minutes of Exercise per Session:   Stress:   . Feeling of Stress :   Social Connections:   . Frequency of Communication with Friends and Family:   . Frequency of Social Gatherings with Friends and Family:   . Attends Religious Services:   . Active Member of Clubs or Organizations:   . Attends [employer]land [employer] Meetings:   Banker Marital Status:   Intimate Partner Violence:   . Fear of Current or Ex-Partner:   . Emotionally Abused:   [employer]land [employer] Physically Abused:   . Sexually Abused:     Family History:  Family History  Problem Relation Age of Onset  . Healthy Mother   . Diabetes Maternal Grandmother   . Heart disease Paternal Grandfather        MIs in 57s     Physical Exam Vitals: BP 130/72   Pulse 68   Resp 18   Ht 5\' 2"  (1.575 m)   Wt 191 lb 6.4 oz (86.8 kg)   SpO2 98%   BMI 35.01 kg/m    Physical Exam Constitutional:      General: She is not in acute distress.    Appearance: She is well-developed.  HENT:      Head: Normocephalic and atraumatic.  Neck:     Thyroid: No thyromegaly.  Cardiovascular:     Rate and Rhythm: Normal rate and regular rhythm.     Heart sounds: No murmur heard.   Pulmonary:     Effort: Pulmonary effort is normal.     Breath sounds: Normal breath sounds.  Chest:     Breasts:  Right: Normal. No mass, nipple discharge, skin change or tenderness.        Left: Normal. No mass, nipple discharge, skin change or tenderness.  Abdominal:     General: There is no distension.     Palpations: Abdomen is soft. There is no mass.     Tenderness: There is no abdominal tenderness. There is no guarding.     Comments: No hepatomegaly. No evidence of hernia  Genitourinary:    Comments: Vulva: no lesions or inflammation Vagina: no lesions or masses, small amount white homogenous discharge Cervix: no masses, NT, anterior, friable os with Pap Uterus: Retroverted, NSSC, NT, mobile Adnexa: no masses, NT Musculoskeletal:        General: Normal range of motion.     Cervical back: Normal range of motion.  Lymphadenopathy:     Cervical: No cervical adenopathy.     Upper Body:     Right upper body: No supraclavicular or axillary adenopathy.     Left upper body: No supraclavicular or axillary adenopathy.  Skin:    General: Skin is warm and dry.  Neurological:     Mental Status: She is alert and oriented to person, place, and time.    No results found for this or any previous visit (from the past 48 hour(s)).  Assessment: 49 y.o. [id number] well woman exam Amenorrhea on Depo provera  Plan:  1) Contraception: Depo provera 150 mgm IM q12 weeks. Next dose due week of 14 Sept. Recommend stopping Depo after march of next year. Can check FSH, LH when she returns for annual.  2) Cervical cancer screening:  Pap not done. Desires Pap smear every third year.  3) Routine healthcare maintenance including cholesterol, diabetes screening managed by PCP.   4) Breast cancer screening: continue  monthly SBE and annual screening mammograms. Patient to schedule mammogram at [facility] this afternoon  5) Follow up 1 year for routine annual exam  6) Colon cancer screen: UTD   Farrel Conners, CNM

## 2019-09-30 ENCOUNTER — Other Ambulatory Visit: Payer: Self-pay

## 2019-09-30 ENCOUNTER — Ambulatory Visit (INDEPENDENT_AMBULATORY_CARE_PROVIDER_SITE_OTHER): Payer: BC Managed Care – PPO | Admitting: Certified Nurse Midwife

## 2019-09-30 ENCOUNTER — Encounter: Payer: Self-pay | Admitting: Certified Nurse Midwife

## 2019-09-30 VITALS — BP 130/72 | HR 68 | Resp 18 | Ht 62.0 in | Wt 191.4 lb

## 2019-09-30 DIAGNOSIS — Z01419 Encounter for gynecological examination (general) (routine) without abnormal findings: Secondary | ICD-10-CM | POA: Diagnosis not present

## 2019-09-30 DIAGNOSIS — Z1231 Encounter for screening mammogram for malignant neoplasm of breast: Secondary | ICD-10-CM | POA: Diagnosis not present

## 2019-09-30 DIAGNOSIS — Z3042 Encounter for surveillance of injectable contraceptive: Secondary | ICD-10-CM

## 2019-09-30 MED ORDER — MEDROXYPROGESTERONE ACETATE 150 MG/ML IM SUSP: INTRAMUSCULAR | 3 refills | Status: DC

## 2019-10-03 LAB — HM MAMMOGRAPHY

## 2019-10-06 ENCOUNTER — Encounter: Payer: Self-pay | Admitting: Physician Assistant

## 2019-11-25 IMAGING — CT CT HEAD W/O CM
3 series · 15 of 45 positions shown, 18 images · non-contrast
Comparison: None.

CLINICAL DATA: Dizziness nausea and headache

EXAM:
CT HEAD WITHOUT CONTRAST
TECHNIQUE: Contiguous axial images were obtained from the base of the skull
through the vertex without intravenous contrast.

[Series 2: head wo · axial · 0.40mm/px · z∈[+301,+416]mm · 9 of 28 slices shown, 12 images]
[im 3/28  brain]
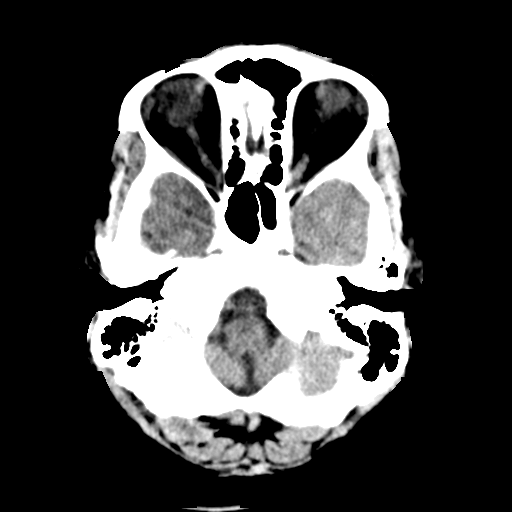
[im 3/28  bone]
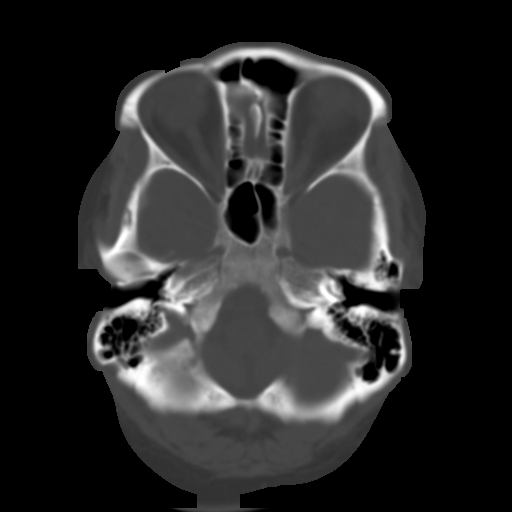
[im 6/28  brain]
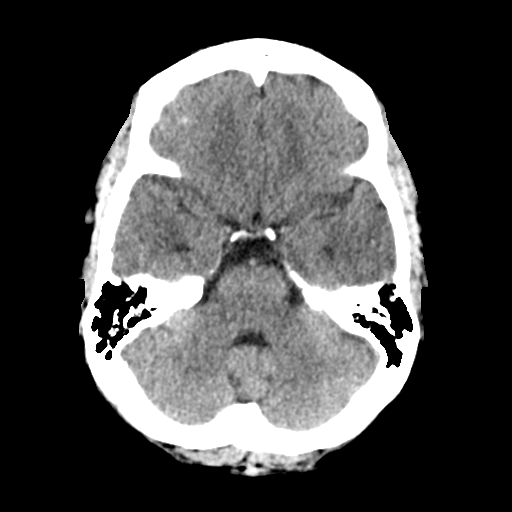
[im 9/28  brain]
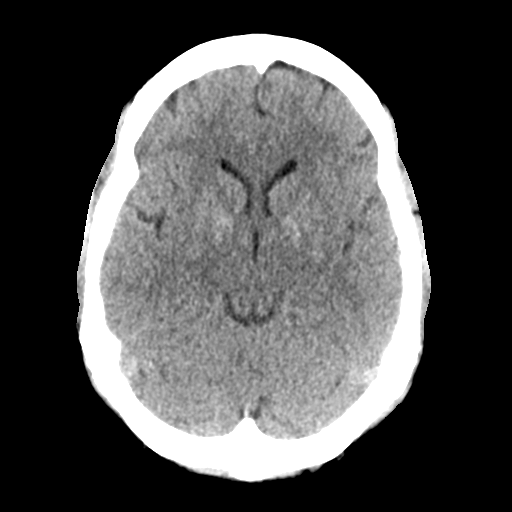
[im 12/28  brain]
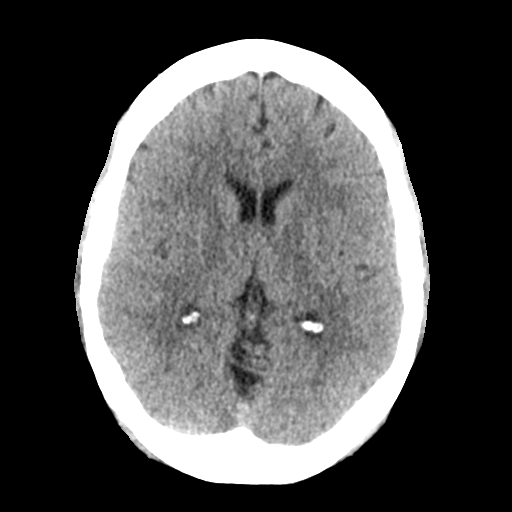
[im 15/28  brain]
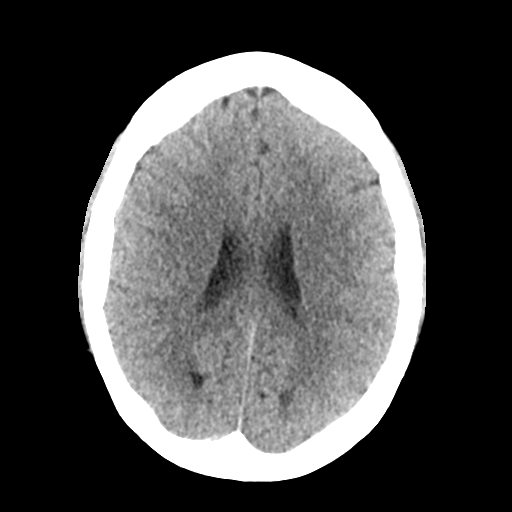
[im 15/28  bone]
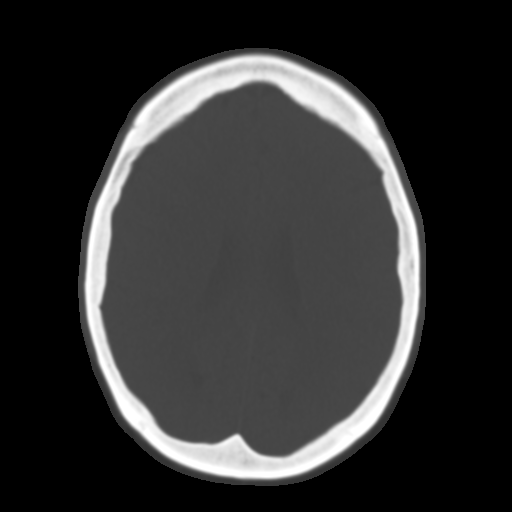
[im 17/28  brain]
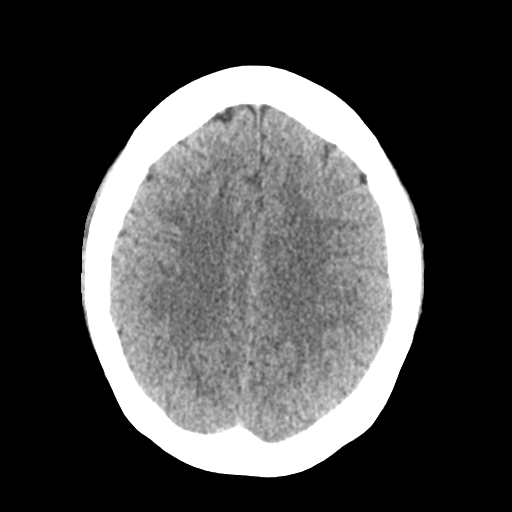
[im 20/28  brain]
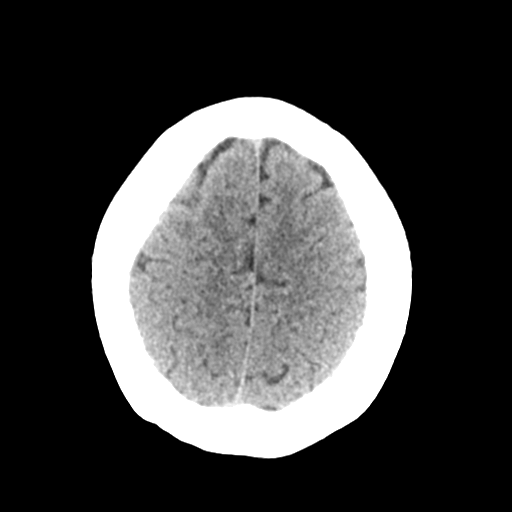
[im 23/28  brain]
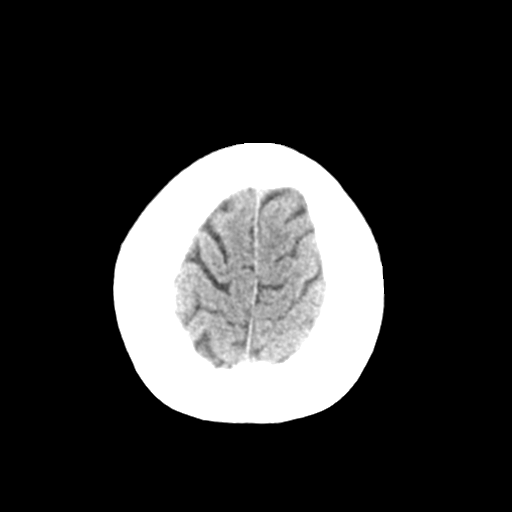
[im 26/28  brain]
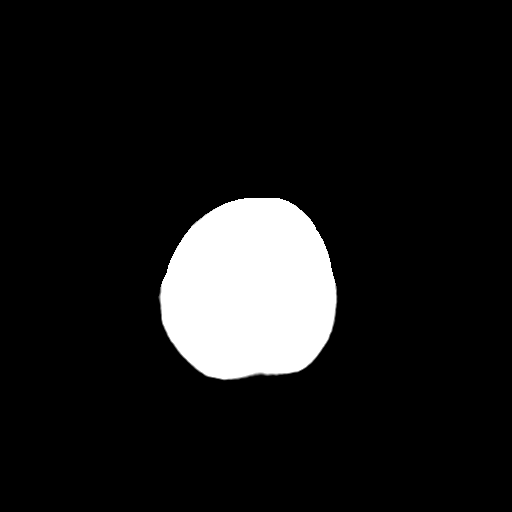
[im 26/28  bone]
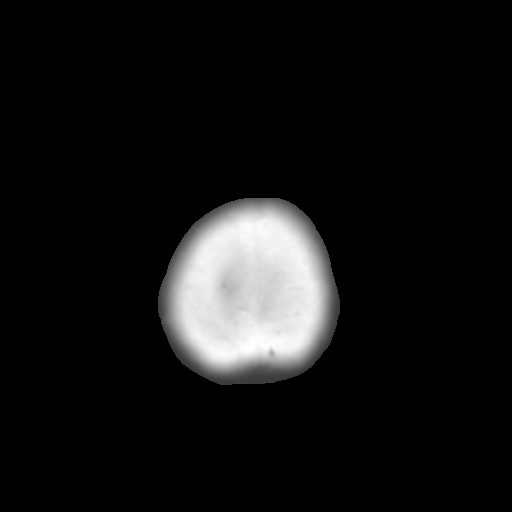

[Series 4: coronal soft tissue · coronal · 0.30mm/px · 3 of 62 slices shown]
[im 21/62  brain]
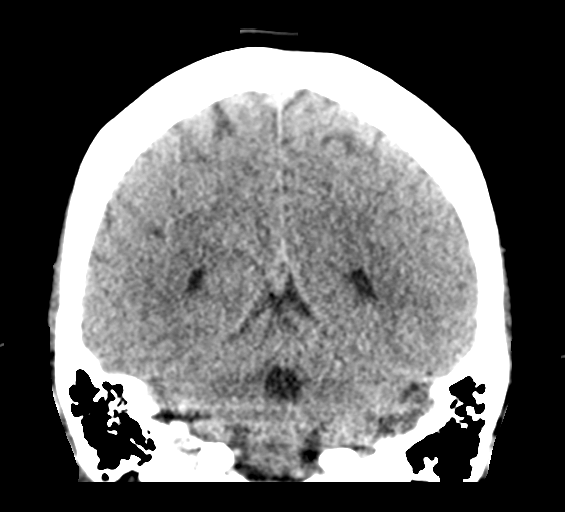
[im 28/62  brain]
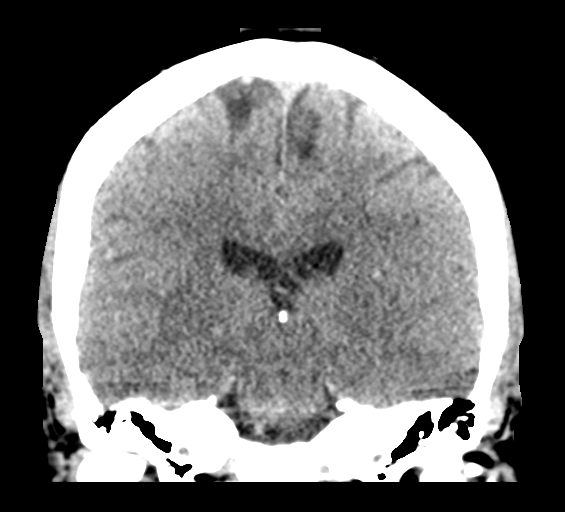
[im 34/62  brain]
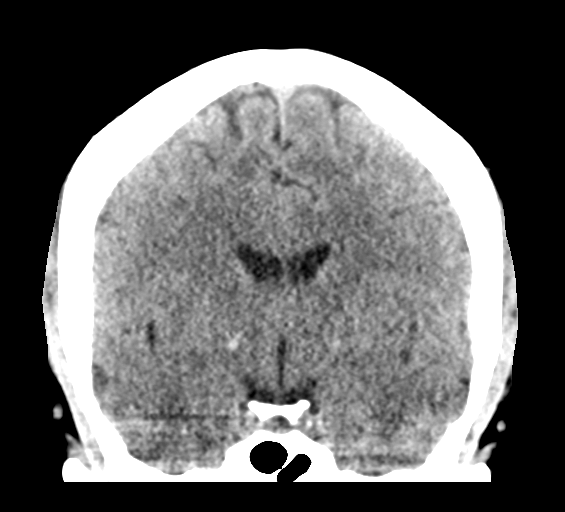

[Series 5: sagittal soft tissue · sagittal · 0.29mm/px · 3 of 51 slices shown]
[im 17/51  brain]
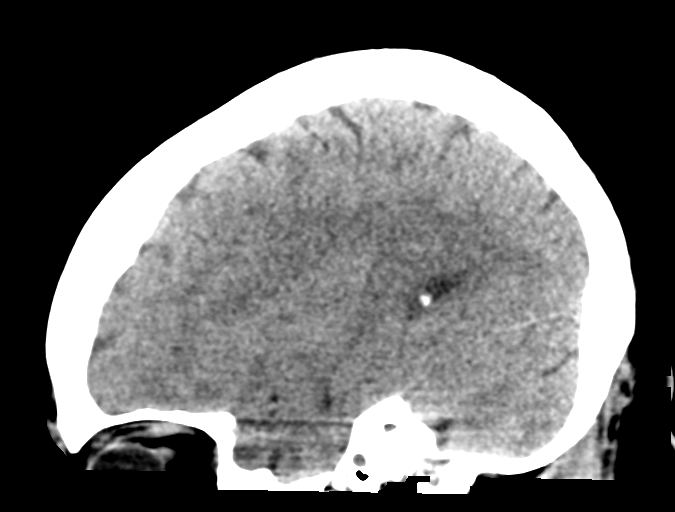
[im 26/51  brain]
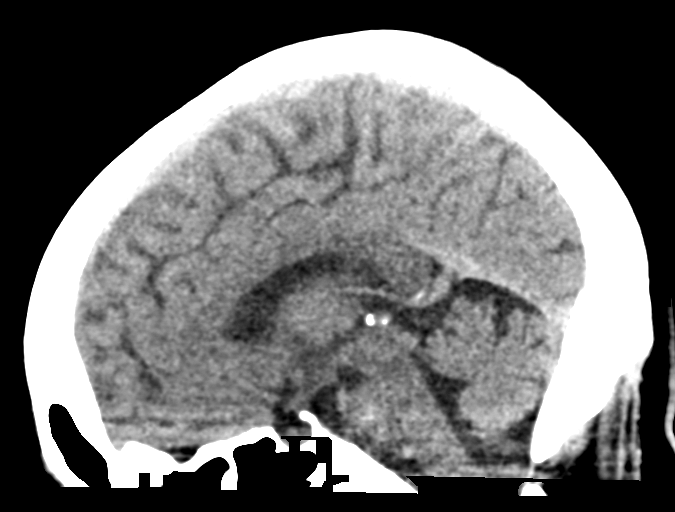
[im 34/51  brain]
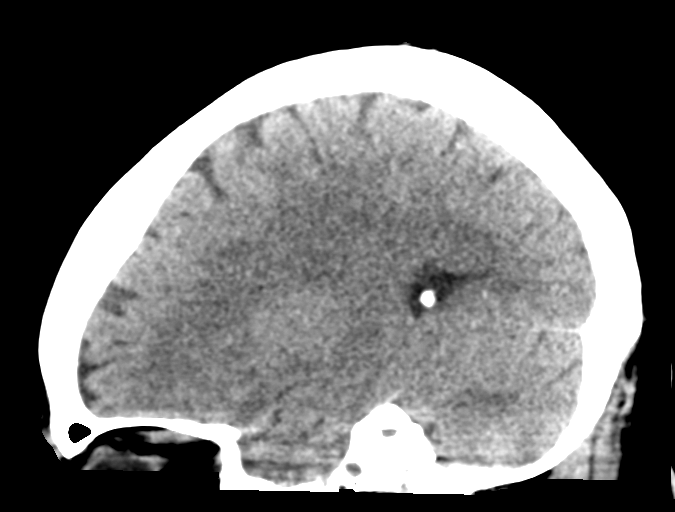

[15 of 45 positions shown; findings below may reference images not displayed]

FINDINGS: Brain: No acute territorial infarction, hemorrhage or intracranial
mass is visualized. The ventricles are nonenlarged.

Vascular: No hyperdense vessel or unexpected calcification.

Skull: Normal. Negative for fracture or focal lesion.

Sinuses/Orbits: No acute finding.

Other: None
IMPRESSION: Negative non contrasted CT appearance of the brain.

## 2019-11-30 ENCOUNTER — Other Ambulatory Visit: Payer: Self-pay | Admitting: Physician Assistant

## 2019-11-30 DIAGNOSIS — I10 Essential (primary) hypertension: Secondary | ICD-10-CM

## 2019-12-09 ENCOUNTER — Ambulatory Visit (INDEPENDENT_AMBULATORY_CARE_PROVIDER_SITE_OTHER): Payer: BC Managed Care – PPO

## 2019-12-09 ENCOUNTER — Other Ambulatory Visit: Payer: Self-pay

## 2019-12-09 DIAGNOSIS — Z3042 Encounter for surveillance of injectable contraceptive: Secondary | ICD-10-CM

## 2019-12-09 MED ORDER — MEDROXYPROGESTERONE ACETATE 150 MG/ML IM SUSP
150.0000 mg | Freq: Once | INTRAMUSCULAR | Status: AC
  Administered 2019-12-09: 150 mg via INTRAMUSCULAR

## 2019-12-09 NOTE — Progress Notes (Signed)
Patient presents today for Depo Provera injection within dates. Given IM LUOQ. Patient tolerated well. 

## 2020-02-10 ENCOUNTER — Encounter: Payer: Self-pay | Admitting: Physician Assistant

## 2020-03-03 ENCOUNTER — Other Ambulatory Visit: Payer: Self-pay

## 2020-03-03 ENCOUNTER — Ambulatory Visit (INDEPENDENT_AMBULATORY_CARE_PROVIDER_SITE_OTHER): Payer: BC Managed Care – PPO

## 2020-03-03 DIAGNOSIS — Z3042 Encounter for surveillance of injectable contraceptive: Secondary | ICD-10-CM

## 2020-03-03 MED ORDER — MEDROXYPROGESTERONE ACETATE 150 MG/ML IM SUSP
150.0000 mg | Freq: Once | INTRAMUSCULAR | Status: AC
  Administered 2020-03-03: 150 mg via INTRAMUSCULAR

## 2020-03-03 NOTE — Progress Notes (Signed)
Pt here for depo which was given IM right glut.  NDC# 0548-5400-00 

## 2020-03-15 ENCOUNTER — Encounter: Payer: Self-pay | Admitting: Physician Assistant

## 2020-03-15 ENCOUNTER — Ambulatory Visit: Payer: BC Managed Care – PPO | Admitting: Physician Assistant

## 2020-03-15 ENCOUNTER — Other Ambulatory Visit: Payer: Self-pay

## 2020-03-15 VITALS — BP 150/83 | HR 76 | Temp 98.3°F | Resp 16 | Wt 204.5 lb

## 2020-03-15 DIAGNOSIS — R5382 Chronic fatigue, unspecified: Secondary | ICD-10-CM | POA: Diagnosis not present

## 2020-03-15 DIAGNOSIS — R6889 Other general symptoms and signs: Secondary | ICD-10-CM

## 2020-03-15 DIAGNOSIS — Z1159 Encounter for screening for other viral diseases: Secondary | ICD-10-CM

## 2020-03-15 DIAGNOSIS — G479 Sleep disorder, unspecified: Secondary | ICD-10-CM | POA: Diagnosis not present

## 2020-03-15 DIAGNOSIS — I10 Essential (primary) hypertension: Secondary | ICD-10-CM

## 2020-03-15 DIAGNOSIS — Z114 Encounter for screening for human immunodeficiency virus [HIV]: Secondary | ICD-10-CM

## 2020-03-15 MED ORDER — TRAZODONE HCL 50 MG PO TABS
25.0000 mg | ORAL_TABLET | Freq: Every evening | ORAL | 3 refills | Status: DC | PRN
Start: 2020-03-15 — End: 2020-03-23

## 2020-03-15 NOTE — Patient Instructions (Signed)
Trazodone tablets What is this medicine? TRAZODONE (TRAZ oh done) is used to treat depression. This medicine may be used for other purposes; ask your health care provider or pharmacist if you have questions. COMMON BRAND NAME(S): Desyrel What should I tell my health care provider before I take this medicine? They need to know if you have any of these conditions:  attempted suicide or thinking about it  bipolar disorder  bleeding problems  glaucoma  heart disease, or previous heart attack  irregular heart beat  kidney or liver disease  low levels of sodium in the blood  an unusual or allergic reaction to trazodone, other medicines, foods, dyes or preservatives  pregnant or trying to get pregnant  breast-feeding How should I use this medicine? Take this medicine by mouth with a glass of water. Follow the directions on the prescription label. Take this medicine shortly after a meal or a light snack. Take your medicine at regular intervals. Do not take your medicine more often than directed. Do not stop taking this medicine suddenly except upon the advice of your doctor. Stopping this medicine too quickly may cause serious side effects or your condition may worsen. A special MedGuide will be given to you by the pharmacist with each prescription and refill. Be sure to read this information carefully each time. Talk to your pediatrician regarding the use of this medicine in children. Special care may be needed. Overdosage: If you think you have taken too much of this medicine contact a poison control center or emergency room at once. NOTE: This medicine is only for you. Do not share this medicine with others. What if I miss a dose? If you miss a dose, take it as soon as you can. If it is almost time for your next dose, take only that dose. Do not take double or extra doses. What may interact with this medicine? Do not take this medicine with any of the following  medications:  certain medicines for fungal infections like fluconazole, itraconazole, ketoconazole, posaconazole, voriconazole  cisapride  dronedarone  linezolid  MAOIs like Carbex, Eldepryl, Marplan, Nardil, and Parnate  mesoridazine  methylene blue (injected into a vein)  pimozide  saquinavir  thioridazine This medicine may also interact with the following medications:  alcohol  antiviral medicines for HIV or AIDS  aspirin and aspirin-like medicines  barbiturates like phenobarbital  certain medicines for blood pressure, heart disease, irregular heart beat  certain medicines for depression, anxiety, or psychotic disturbances  certain medicines for migraine headache like almotriptan, eletriptan, frovatriptan, naratriptan, rizatriptan, sumatriptan, zolmitriptan  certain medicines for seizures like carbamazepine and phenytoin  certain medicines for sleep  certain medicines that treat or prevent blood clots like dalteparin, enoxaparin, warfarin  digoxin  fentanyl  lithium  NSAIDS, medicines for pain and inflammation, like ibuprofen or naproxen  other medicines that prolong the QT interval (cause an abnormal heart rhythm) like dofetilide  rasagiline  supplements like St. John's wort, kava kava, valerian  tramadol  tryptophan This list may not describe all possible interactions. Give your health care provider a list of all the medicines, herbs, non-prescription drugs, or dietary supplements you use. Also tell them if you smoke, drink alcohol, or use illegal drugs. Some items may interact with your medicine. What should I watch for while using this medicine? Tell your doctor if your symptoms do not get better or if they get worse. Visit your doctor or health care professional for regular checks on your progress. Because it may take   several weeks to see the full effects of this medicine, it is important to continue your treatment as prescribed by your  doctor. Patients and their families should watch out for new or worsening thoughts of suicide or depression. Also watch out for sudden changes in feelings such as feeling anxious, agitated, panicky, irritable, hostile, aggressive, impulsive, severely restless, overly excited and hyperactive, or not being able to sleep. If this happens, especially at the beginning of treatment or after a change in dose, call your health care professional. You may get drowsy or dizzy. Do not drive, use machinery, or do anything that needs mental alertness until you know how this medicine affects you. Do not stand or sit up quickly, especially if you are an older patient. This reduces the risk of dizzy or fainting spells. Alcohol may interfere with the effect of this medicine. Avoid alcoholic drinks. This medicine may cause dry eyes and blurred vision. If you wear contact lenses you may feel some discomfort. Lubricating drops may help. See your eye doctor if the problem does not go away or is severe. Your mouth may get dry. Chewing sugarless gum, sucking hard candy and drinking plenty of water may help. Contact your doctor if the problem does not go away or is severe. What side effects may I notice from receiving this medicine? Side effects that you should report to your doctor or health care professional as soon as possible:  allergic reactions like skin rash, itching or hives, swelling of the face, lips, or tongue  elevated mood, decreased need for sleep, racing thoughts, impulsive behavior  confusion  fast, irregular heartbeat  feeling faint or lightheaded, falls  feeling agitated, angry, or irritable  loss of balance or coordination  painful or prolonged erections  restlessness, pacing, inability to keep still  suicidal thoughts or other mood changes  tremors  trouble sleeping  seizures  unusual bleeding or bruising Side effects that usually do not require medical attention (report to your doctor  or health care professional if they continue or are bothersome):  change in sex drive or performance  change in appetite or weight  constipation  headache  muscle aches or pains  nausea This list may not describe all possible side effects. Call your doctor for medical advice about side effects. You may report side effects to FDA at 1-800-FDA-1088. Where should I keep my medicine? Keep out of the reach of children. Store at room temperature between 15 and 30 degrees C (59 to 86 degrees F). Protect from light. Keep container tightly closed. Throw away any unused medicine after the expiration date. NOTE: This sheet is a summary. It may not cover all possible information. If you have questions about this medicine, talk to your doctor, pharmacist, or health care provider.  2020 Elsevier/Gold Standard (2018-03-05 11:46:46)  

## 2020-03-15 NOTE — Progress Notes (Signed)
Established patient visit   Patient: Cassidy Kelly   DOB: 07/14/1970   48 y.o. Female  MRN: [id number] Visit Date: 03/15/2020  Today's healthcare provider: Margaretann Loveless, PA-C   Chief Complaint  Patient presents with  . Insomnia   Subjective    HPI  Patient here with c/o feeling cold for several weeks. She also reports that she is not sleeping well. She is having trouble staying asleep. She is averaging 3 hours of sleep. She has been doing Melatonin, lavender, soothing noise/music and is not helping. States all this is affecting her focus at school.   Declined Influenza vaccine.  Patient Active Problem List   Diagnosis Date Noted  . Hypercholesterolemia 08/08/2017  . Essential hypertension 08/08/2017  . Vertigo 07/17/2017  . AOM (acute otitis media) 06/24/2015  . Allergic rhinitis 06/24/2015   Past Medical History:  Diagnosis Date  . Allergy   . 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 Hypercholesterolemia   . Hypertension   . Obesity (BMI 30-39.9)        Medications: Outpatient Medications Prior to Visit  Medication Sig  . amLODipine (NORVASC) 10 MG tablet TAKE 1 TABLET(10 MG) BY MOUTH DAILY  . medroxyPROGESTERone (DEPO-PROVERA) 150 MG/ML injection ADMINISTER 1 ML IN THE MUSCLE EVERY 3 MONTHS  . Multiple Vitamin (MULTIVITAMIN) LIQD Take 5 mLs by mouth daily. Uses essential oil Lifelong Vitality vitamins  . TURMERIC PO Take by mouth.  . Cyanocobalamin (B-12 PO) Take by mouth daily. (Patient not taking: Reported on 03/15/2020)   No facility-administered medications prior to visit.    Review of Systems  Constitutional: Positive for fatigue.  HENT: Negative.   Respiratory: Negative.   Cardiovascular: Negative.   Endocrine: Positive for cold intolerance.  Psychiatric/Behavioral: Positive for decreased concentration and sleep disturbance.    Last CBC Lab Results  Component Value Date   WBC 10.5 07/24/2019   HGB 12.5 07/24/2019   HCT 37.8 07/24/2019   MCV 91  07/24/2019   MCH 30.1 07/24/2019   RDW 12.7 07/24/2019   PLT 255 07/24/2019   Last metabolic panel Lab Results  Component Value Date   GLUCOSE 86 07/24/2019   NA 135 07/24/2019   K 3.4 (L) 07/24/2019   CL 101 07/24/2019   CO2 21 07/24/2019   BUN 9 07/24/2019   CREATININE 0.81 07/24/2019   GFRNONAA 85 07/24/2019   GFRAA 98 07/24/2019   CALCIUM 9.2 07/24/2019   PROT 7.5 07/24/2019   ALBUMIN 4.7 07/24/2019   LABGLOB 2.8 07/24/2019   AGRATIO 1.7 07/24/2019   BILITOT 0.5 07/24/2019   ALKPHOS 82 07/24/2019   AST 25 07/24/2019   ALT 23 07/24/2019   ANIONGAP 10 06/14/2017      Objective    BP (!) 150/83 (BP Location: Left Arm, Patient Position: Sitting, Cuff Size: Large)   Pulse 76   Temp 98.3 F (36.8 C) (Oral)   Resp 16   Wt 204 lb 8 oz (92.8 kg)   BMI 37.40 kg/m  BP Readings from Last 3 Encounters:  03/15/20 (!) 150/83  09/30/19 130/72  07/24/19 (!) 149/76   Wt Readings from Last 3 Encounters:  03/15/20 204 lb 8 oz (92.8 kg)  09/30/19 191 lb 6.4 oz (86.8 kg)  07/24/19 192 lb (87.1 kg)      Physical Exam Vitals reviewed.  Constitutional:      Appearance: Normal appearance. She is well-developed, well-groomed and well-nourished.  HENT:     Head: Normocephalic and atraumatic.  Eyes:  Extraocular Movements: EOM normal.  Pulmonary:     Effort: Pulmonary effort is normal. No respiratory distress.  Musculoskeletal:     Cervical back: Normal range of motion and neck supple.  Neurological:     Mental Status: She is alert.  Psychiatric:        Mood and Affect: Mood and affect and mood normal.        Behavior: Behavior normal. Behavior is cooperative.        Thought Content: Thought content normal.        Judgment: Judgment normal.     No results found for any visits on 03/15/20.  Assessment & Plan     1. Chronic fatigue DDx: sleep issue, anemia, hypothyroid. Will check labs as below and f/u pending results.  - CBC w/Diff/Platelet - Comprehensive  Metabolic Panel (CMET) - TSH - Fe+TIBC+Fer  2. Cold intolerance See above medical treatment plan. - CBC w/Diff/Platelet - Comprehensive Metabolic Panel (CMET) - TSH - Fe+TIBC+Fer  3. Difficulty sleeping Worsening acutely over last few weeks. Started in July 2021 and has been progressing. Will start trazodone as below. F/U in 4-6 weeks, virtual ok.  - traZODone (DESYREL) 50 MG tablet; Take 0.5-1 tablets (25-50 mg total) by mouth at bedtime as needed for sleep.  Dispense: 30 tablet; Refill: 3  4. Essential hypertension Stable on amlodipine 10mg . Will check labs as below and f/u pending results. - Comprehensive Metabolic Panel (CMET) - TSH  5. Screening for HIV without presence of risk factors Will check labs as below and f/u pending results. - HIV antibody (with reflex)  6. Encounter for hepatitis C screening test for low risk patient Will check labs as below and f/u pending results. - Hepatitis C Antibody   No follow-ups on file.      , PA-C, have reviewed all documentation for this visit. The documentation on 03/15/20 for the exam, diagnosis, procedures, and orders are all accurate and complete.   03/17/20  [facility] [phone] (phone) [phone] (fax)  [facility]

## 2020-03-16 LAB — CBC WITH DIFFERENTIAL/PLATELET
Basophils Absolute: 0.1 10*3/uL (ref 0.0–0.2)
Basos: 1 %
EOS (ABSOLUTE): 0.1 10*3/uL (ref 0.0–0.4)
Eos: 1 %
Hematocrit: 37.7 % (ref 34.0–46.6)
Hemoglobin: 12.8 g/dL (ref 11.1–15.9)
Immature Grans (Abs): 0.1 10*3/uL (ref 0.0–0.1)
Immature Granulocytes: 1 %
Lymphocytes Absolute: 3.7 10*3/uL — ABNORMAL HIGH (ref 0.7–3.1)
Lymphs: 33 %
MCH: 30.8 pg (ref 26.6–33.0)
MCHC: 34 g/dL (ref 31.5–35.7)
MCV: 91 fL (ref 79–97)
Monocytes Absolute: 0.6 10*3/uL (ref 0.1–0.9)
Monocytes: 6 %
Neutrophils Absolute: 6.6 10*3/uL (ref 1.4–7.0)
Neutrophils: 58 %
Platelets: 265 10*3/uL (ref 150–450)
RBC: 4.15 x10E6/uL (ref 3.77–5.28)
RDW: 13.6 % (ref 11.7–15.4)
WBC: 11.1 10*3/uL — ABNORMAL HIGH (ref 3.4–10.8)

## 2020-03-16 LAB — COMPREHENSIVE METABOLIC PANEL
ALT: 16 IU/L (ref 0–32)
AST: 17 IU/L (ref 0–40)
Albumin/Globulin Ratio: 1.8 (ref 1.2–2.2)
Albumin: 4.5 g/dL (ref 3.8–4.8)
Alkaline Phosphatase: 76 IU/L (ref 44–121)
BUN/Creatinine Ratio: 11 (ref 9–23)
BUN: 10 mg/dL (ref 6–24)
Bilirubin Total: 0.5 mg/dL (ref 0.0–1.2)
CO2: 21 mmol/L (ref 20–29)
Calcium: 9.1 mg/dL (ref 8.7–10.2)
Chloride: 102 mmol/L (ref 96–106)
Creatinine, Ser: 0.91 mg/dL (ref 0.57–1.00)
GFR calc Af Amer: 85 mL/min/{1.73_m2} (ref >59)
GFR calc non Af Amer: 74 mL/min/{1.73_m2} (ref >59)
Globulin, Total: 2.5 g/dL (ref 1.5–4.5)
Glucose: 90 mg/dL (ref 65–99)
Potassium: 4 mmol/L (ref 3.5–5.2)
Sodium: 138 mmol/L (ref 134–144)
Total Protein: 7 g/dL (ref 6.0–8.5)

## 2020-03-16 LAB — IRON,TIBC AND FERRITIN PANEL
Ferritin: 173 ng/mL — ABNORMAL HIGH (ref 15–150)
Iron Saturation: 37 % (ref 15–55)
Iron: 129 ug/dL (ref 27–159)
Total Iron Binding Capacity: 349 ug/dL (ref 250–450)
UIBC: 220 ug/dL (ref 131–425)

## 2020-03-16 LAB — TSH: TSH: 1.93 u[IU]/mL (ref 0.450–4.500)

## 2020-03-16 LAB — HEPATITIS C ANTIBODY: Hep C Virus Ab: <0.1 s/co ratio (ref 0.0–0.9)

## 2020-03-16 LAB — HIV ANTIBODY (ROUTINE TESTING W REFLEX): HIV Screen 4th Generation wRfx: NONREACTIVE

## 2020-03-22 ENCOUNTER — Encounter: Payer: Self-pay | Admitting: Physician Assistant

## 2020-03-22 DIAGNOSIS — G479 Sleep disorder, unspecified: Secondary | ICD-10-CM

## 2020-03-23 MED ORDER — RAMELTEON 8 MG PO TABS: 8.0000 mg | ORAL_TABLET | Freq: Every day | ORAL | 0 refills | Status: DC

## 2020-03-23 NOTE — Addendum Note (Signed)
Addended by: Margaretann Loveless on: 03/23/2020 05:32 PM   Modules accepted: Orders

## 2020-04-16 ENCOUNTER — Telehealth: Payer: Self-pay | Admitting: Physician Assistant

## 2020-05-13 ENCOUNTER — Telehealth: Payer: Self-pay

## 2020-05-13 DIAGNOSIS — I10 Essential (primary) hypertension: Secondary | ICD-10-CM

## 2020-05-13 MED ORDER — AMLODIPINE BESYLATE 10 MG PO TABS: ORAL_TABLET | ORAL | 0 refills | Status: DC

## 2020-05-13 NOTE — Telephone Encounter (Signed)
[facility] faxed refill request for the following medications: ° °amLODipine (NORVASC) 10 MG tablet ° ° °Please advise. ° °Thanks, °[facility] °

## 2020-05-26 ENCOUNTER — Other Ambulatory Visit: Payer: Self-pay

## 2020-05-26 ENCOUNTER — Ambulatory Visit (INDEPENDENT_AMBULATORY_CARE_PROVIDER_SITE_OTHER): Payer: BC Managed Care – PPO

## 2020-05-26 DIAGNOSIS — Z3042 Encounter for surveillance of injectable contraceptive: Secondary | ICD-10-CM | POA: Diagnosis not present

## 2020-05-26 MED ORDER — MEDROXYPROGESTERONE ACETATE 150 MG/ML IM SUSP
150.0000 mg | Freq: Once | INTRAMUSCULAR | Status: AC
  Administered 2020-05-26: 150 mg via INTRAMUSCULAR

## 2020-05-26 NOTE — Progress Notes (Signed)
Patient presents today for Depo Provera injection within dates. Given IM LUOQ. Patient tolerated well. 

## 2020-06-18 ENCOUNTER — Telehealth: Payer: Self-pay

## 2020-06-18 NOTE — Telephone Encounter (Signed)
She will not have to worry. We will transition her within the office to one of the other female providers when she is in need of an appointment.

## 2020-06-18 NOTE — Telephone Encounter (Signed)
Copied from CRM [id number]. Topic: General - Other >> Jun 18, 2020 12:05 PM [name], Cassidy Kelly wrote: Reason for CRM: Pt stated she was very happy with [facility] and wanted to discuss her options moving forward, Pt wants to stay with [facility]. Pt wanted to discuss who would be the best fit for her to see moving forward.

## 2020-08-08 ENCOUNTER — Other Ambulatory Visit: Payer: Self-pay | Admitting: Physician Assistant

## 2020-08-08 DIAGNOSIS — I10 Essential (primary) hypertension: Secondary | ICD-10-CM

## 2020-08-19 ENCOUNTER — Ambulatory Visit (INDEPENDENT_AMBULATORY_CARE_PROVIDER_SITE_OTHER): Payer: BC Managed Care – PPO

## 2020-08-19 ENCOUNTER — Other Ambulatory Visit: Payer: Self-pay

## 2020-08-19 DIAGNOSIS — Z3042 Encounter for surveillance of injectable contraceptive: Secondary | ICD-10-CM | POA: Diagnosis not present

## 2020-08-19 MED ORDER — MEDROXYPROGESTERONE ACETATE 150 MG/ML IM SUSP
150.0000 mg | Freq: Once | INTRAMUSCULAR | Status: AC
  Administered 2020-08-19: 150 mg via INTRAMUSCULAR

## 2020-08-19 NOTE — Progress Notes (Signed)
Patient presents today for Depo Provera injection within dates. Given IM RUOQ. Patient tolerated well. 

## 2020-09-24 ENCOUNTER — Encounter: Payer: Self-pay | Admitting: Family Medicine

## 2020-09-24 ENCOUNTER — Ambulatory Visit (INDEPENDENT_AMBULATORY_CARE_PROVIDER_SITE_OTHER): Payer: BC Managed Care – PPO | Admitting: Family Medicine

## 2020-09-24 ENCOUNTER — Other Ambulatory Visit: Payer: Self-pay

## 2020-09-24 VITALS — BP 139/80 | Temp 98.5°F | Resp 16 | Ht 62.0 in | Wt 203.7 lb

## 2020-09-24 DIAGNOSIS — R5382 Chronic fatigue, unspecified: Secondary | ICD-10-CM | POA: Diagnosis not present

## 2020-09-24 DIAGNOSIS — I10 Essential (primary) hypertension: Secondary | ICD-10-CM

## 2020-09-24 DIAGNOSIS — G4700 Insomnia, unspecified: Secondary | ICD-10-CM

## 2020-09-24 DIAGNOSIS — G478 Other sleep disorders: Secondary | ICD-10-CM | POA: Diagnosis not present

## 2020-09-24 DIAGNOSIS — E7800 Pure hypercholesterolemia, unspecified: Secondary | ICD-10-CM

## 2020-09-24 DIAGNOSIS — Z1231 Encounter for screening mammogram for malignant neoplasm of breast: Secondary | ICD-10-CM

## 2020-09-24 NOTE — Assessment & Plan Note (Signed)
Well controlled on home readings Continue current medications Recheck metabolic panel F/u in 6 months  

## 2020-09-24 NOTE — Assessment & Plan Note (Signed)
Reviewed last lipid panel Not currently on a statin Recheck FLP and CMP Discussed diet and exercise  

## 2020-09-24 NOTE — Assessment & Plan Note (Signed)
Never feels rested Snores Failed ambien in the past Concern for possible OSA

## 2020-09-24 NOTE — Assessment & Plan Note (Signed)
Check labs Check PSG for possible OSA

## 2020-09-24 NOTE — Progress Notes (Signed)
Established patient visit   Patient: Cassidy Kelly   DOB: Jun 26, 1970   50 y.o. Female  MRN: [id number] Visit Date: 09/24/2020  Today's healthcare provider: Shirlee Latch, MD   Chief Complaint  Patient presents with   Hypertension   Hyperlipidemia   Follow-up   Subjective    Hypertension Pertinent negatives include no chest pain, palpitations or shortness of breath.  Hyperlipidemia Pertinent negatives include no chest pain or shortness of breath.   Hypertension, follow-up  BP Readings from Last 3 Encounters:  09/24/20 139/80  03/15/20 (!) 150/83  09/30/19 130/72   Wt Readings from Last 3 Encounters:  09/24/20 203 lb 11.2 oz (92.4 kg)  03/15/20 204 lb 8 oz (92.8 kg)  09/30/19 191 lb 6.4 oz (86.8 kg)     She was last seen for hypertension 6 months ago.  BP at that visit was 150/83. Management since that visit includes no changes.  She reports excellent compliance with treatment. She is not having side effects. She is following a Regular diet. She is not exercising. She does not smoke.  Use of agents associated with hypertension: none.   Outside blood pressures are not being checked. Symptoms: No chest pain No chest pressure  No palpitations No syncope  No dyspnea No orthopnea  No paroxysmal nocturnal dyspnea No lower extremity edema   Pertinent labs: Lab Results  Component Value Date   CHOL 233 (H) 07/24/2019   HDL 68 07/24/2019   LDLCALC 153 (H) 07/24/2019   TRIG 70 07/24/2019   CHOLHDL 5.2 (H) 09/24/2017   Lab Results  Component Value Date   NA 138 03/15/2020   K 4.0 03/15/2020   CREATININE 0.91 03/15/2020   GFRNONAA 74 03/15/2020   GFRAA 85 03/15/2020   GLUCOSE 90 03/15/2020     The 10-year ASCVD risk score Denman George DC Jr., et al., 2013) is: 4.5%   --------------------------------------------------------------------------------------------------- Lipid/Cholesterol, Follow-up  Last lipid panel Other pertinent labs  Lab Results   Component Value Date   CHOL 233 (H) 07/24/2019   HDL 68 07/24/2019   LDLCALC 153 (H) 07/24/2019   TRIG 70 07/24/2019   CHOLHDL 5.2 (H) 09/24/2017   Lab Results  Component Value Date   ALT 16 03/15/2020   AST 17 03/15/2020   PLT 265 03/15/2020   TSH 1.930 03/15/2020     She was last seen for this 6 months ago.  Management since that visit includes no changes.  She reports excellent compliance with treatment. She is not having side effects.   Symptoms: No chest pain No chest pressure/discomfort  No dyspnea No lower extremity edema  No numbness or tingling of extremity No orthopnea  No palpitations No paroxysmal nocturnal dyspnea  No speech difficulty No syncope   Current diet: in general, a "healthy" diet   Current exercise: none  The 10-year ASCVD risk score Denman George DC Jr., et al., 2013) is: 4.5%  --------------------------------------------------------------------------------------------------- Follow up for Chronic fatigue/insomnia  The patient was last seen for this 6 months ago. Changes made at last visit include start Ramelteon.  She reports  did not start medication , patient is now taking ZzzQuil She feels that condition is Improved. She is not having side effects.    Sleep Apnea The patient states that she has been experiencing sleep apnea for years. The patient states that she falls asleep fine and that her issue is staying asleep. She states that when she wakes she feels restless. Her family and relatives state that  she snores. Her symptoms include her feeling sluggish and tired.    -----------------------------------------------------------------------------------------  Patient Active Problem List   Diagnosis Date Noted   Morbid obesity (HCC) 09/24/2020   Chronic fatigue 09/24/2020   Insomnia 09/24/2020   Hypercholesterolemia 08/08/2017   Essential hypertension 08/08/2017   Vertigo 07/17/2017   Allergic rhinitis 06/24/2015   Social History    Tobacco Use   Smoking status: Never   Smokeless tobacco: Never  Vaping Use   Vaping Use: Never used  Substance Use Topics   Alcohol use: Yes    Comment: OCC   Drug use: No   Allergies  Allergen Reactions   Other     Cats   Pollen Extract                     Medications: Outpatient Medications Prior to Visit  Medication Sig   amLODipine (NORVASC) 10 MG tablet TAKE 1 TABLET(10 MG) BY MOUTH DAILY   diphenhydrAMINE HCl, Sleep, (ZZZQUIL PO) Take 1 tablet by mouth at bedtime.   medroxyPROGESTERone (DEPO-PROVERA) 150 MG/ML injection ADMINISTER 1 ML IN THE MUSCLE EVERY 3 MONTHS   Multiple Vitamin (MULTIVITAMIN) tablet Take 1 tablet by mouth daily.   TURMERIC PO Take by mouth.   Cyanocobalamin (B-12 PO) Take by mouth daily. (Patient not taking: No sig reported)   [DISCONTINUED] Multiple Vitamin (MULTIVITAMIN) LIQD Take 5 mLs by mouth daily. Uses essential oil Lifelong Vitality vitamins (Patient not taking: Reported on 09/24/2020)   [DISCONTINUED] ramelteon (ROZEREM) 8 MG tablet Take 1 tablet (8 mg total) by mouth at bedtime. (Patient not taking: Reported on 09/24/2020)   No facility-administered medications prior to visit.    Review of Systems  Constitutional:  Negative for activity change, appetite change and fatigue.  Respiratory:  Negative for chest tightness and shortness of breath.   Cardiovascular:  Negative for chest pain and palpitations.       Objective    BP 139/80 (BP Location: Left Arm, Patient Position: Sitting, Cuff Size: Large)   Temp 98.5 F (36.9 C) (Oral)   Resp 16   Ht 5\' 2"  (1.575 m)   Wt 203 lb 11.2 oz (92.4 kg)   SpO2 100%   BMI 37.26 kg/m  BP Readings from Last 3 Encounters:  09/24/20 139/80  03/15/20 (!) 150/83  09/30/19 130/72   Wt Readings from Last 3 Encounters:  09/24/20 203 lb 11.2 oz (92.4 kg)  03/15/20 204 lb 8 oz (92.8 kg)  09/30/19 191 lb 6.4 oz (86.8 kg)       Physical Exam Vitals reviewed.  Constitutional:      General: She  is not in acute distress.    Appearance: Normal appearance. She is well-developed. She is not diaphoretic.  HENT:     Head: Normocephalic and atraumatic.  Eyes:     General: No scleral icterus.    Conjunctiva/sclera: Conjunctivae normal.  Neck:     Thyroid: No thyromegaly.  Cardiovascular:     Rate and Rhythm: Normal rate and regular rhythm.     Pulses: Normal pulses.     Heart sounds: Normal heart sounds. No murmur heard. Pulmonary:     Effort: Pulmonary effort is normal. No respiratory distress.     Breath sounds: Normal breath sounds. No wheezing, rhonchi or rales.  Musculoskeletal:     Cervical back: Neck supple.     Right lower leg: No edema.     Left lower leg: No edema.  Lymphadenopathy:     Cervical:  No cervical adenopathy.  Skin:    General: Skin is warm and dry.     Findings: No rash.  Neurological:     Mental Status: She is alert and oriented to person, place, and time. Mental status is at baseline.  Psychiatric:        Mood and Affect: Mood normal.        Behavior: Behavior normal.      No results found for any visits on 09/24/20.  Assessment & Plan     Problem List Items Addressed This Visit       Cardiovascular and Mediastinum   Essential hypertension - Primary    Well controlled on home readings Continue current medications Recheck metabolic panel F/u in 6 months        Relevant Orders   Comprehensive metabolic panel     Other   Hypercholesterolemia    Reviewed last lipid panel Not currently on a statin Recheck FLP and CMP Discussed diet and exercise        Relevant Orders   Lipid panel   Comprehensive metabolic panel   Morbid obesity (HCC)    BMI 37 and assoc with HTN and HLD Discussed importance of healthy weight management Discussed diet and exercise        Relevant Orders   Lipid panel   Comprehensive metabolic panel   CBC   TSH   Chronic fatigue    Check labs Check PSG for possible OSA       Relevant Orders    Comprehensive metabolic panel   CBC   TSH   Ambulatory referral to Sleep Studies   VITAMIN D 25 Hydroxy (Vit-D Deficiency, Fractures)   B12   Insomnia    Never feels rested Snores Failed ambien in the past Concern for possible OSA       Relevant Orders   Ambulatory referral to Sleep Studies   Other Visit Diagnoses     Non-restorative sleep       Relevant Orders   Ambulatory referral to Sleep Studies   Screening mammogram for breast cancer       Relevant Orders   MM 3D SCREEN BREAST BILATERAL        Return in about 6 months (around 03/27/2021) for chronic disease f/u, call back in 1 month to schedule with new PCP.      Danelle Earthly Moore[place],acting as a Neurosurgeon for Shirlee Latch, MD.,have documented all relevant documentation on the behalf of Shirlee Latch, MD,as directed by  Shirlee Latch, MD while in the presence of Shirlee Latch, MD.  I, Shirlee Latch, MD, have reviewed all documentation for this visit. The documentation on 09/24/20 for the exam, diagnosis, procedures, and orders are all accurate and complete.   Shanice Poznanski, Marzella Schlein, MD, MPH [facility]

## 2020-09-24 NOTE — Assessment & Plan Note (Signed)
BMI 37 and assoc with HTN and HLD Discussed importance of healthy weight management Discussed diet and exercise  

## 2020-09-24 NOTE — Patient Instructions (Signed)
Sleep Apnea Sleep apnea affects breathing during sleep. It causes breathing to stop for 10 seconds or more, or to become shallow. People with sleep apnea usually snoreloudly. It can also increase the risk of: Heart attack. Stroke. Being very overweight (obese). Diabetes. Heart failure. Irregular heartbeat. High blood pressure. The goal of treatment is to help you breathe normally again. What are the causes?  The most common cause of this condition is a collapsed or blocked airway. There are three kinds of sleep apnea: Obstructive sleep apnea. This is caused by a blocked or collapsed airway. Central sleep apnea. This happens when the brain does not send the right signals to the muscles that control breathing. Mixed sleep apnea. This is a combination of obstructive and central sleep apnea. What increases the risk? Being overweight. Smoking. Having a small airway. Being older. Being female. Drinking alcohol. Taking medicines to calm yourself (sedatives or tranquilizers). Having family members with the condition. Having a tongue or tonsils that are larger than normal. What are the signs or symptoms? Trouble staying asleep. Loud snoring. Headaches in the morning. Waking up gasping. Dry mouth or sore throat in the morning. Being sleepy or tired during the day. If you are sleepy or tired during the day, you may also: Not be able to focus your mind (concentrate). Forget things. Get angry a lot and have mood swings. Feel sad (depressed). Have changes in your personality. Have less interest in sex, if you are female. Be unable to have an erection, if you are female. How is this treated?  Sleeping on your side. Using a medicine to get rid of mucus in your nose (decongestant). Avoiding the use of alcohol, medicines to help you relax, or certain pain medicines (narcotics). Losing weight, if needed. Changing your diet. Quitting smoking. Using a machine to open your airway while you  sleep, such as: An oral appliance. This is a mouthpiece that shifts your lower jaw forward. A CPAP device. This device blows air through a mask when you breathe out (exhale). An EPAP device. This has valves that you put in each nostril. A BPAP device. This device blows air through a mask when you breathe in (inhale) and breathe out. Having surgery if other treatments do not work. Follow these instructions at home: Lifestyle Make changes that your doctor recommends. Eat a healthy diet. Lose weight if needed. Avoid alcohol, medicines to help you relax, and some pain medicines. Do not smoke or use any products that contain nicotine or tobacco. If you need help quitting, ask your doctor. General instructions Take over-the-counter and prescription medicines only as told by your doctor. If you were given a machine to use while you sleep, use it only as told by your doctor. If you are having surgery, make sure to tell your doctor you have sleep apnea. You may need to bring your device with you. Keep all follow-up visits. Contact a doctor if: The machine that you were given to use during sleep bothers you or does not seem to be working. You do not get better. You get worse. Get help right away if: Your chest hurts. You have trouble breathing in enough air. You have an uncomfortable feeling in your back, arms, or stomach. You have trouble talking. One side of your body feels weak. A part of your face is hanging down. These symptoms may be an emergency. Get help right away. Call your local emergency services (911 in the [place] Do not wait to see if the symptoms   will go away. Do not drive yourself to the hospital. Summary This condition affects breathing during sleep. The most common cause is a collapsed or blocked airway. The goal of treatment is to help you breathe normally while you sleep. This information is not intended to replace advice given to you by your health care provider. Make  sure you discuss any questions you have with your healthcare provider. Document Revised: 02/20/2020 Document Reviewed: 02/20/2020 Elsevier Patient Education  2022 Elsevier Inc.  

## 2020-09-25 LAB — LIPID PANEL
Chol/HDL Ratio: 3.8 ratio (ref 0.0–4.4)
Cholesterol, Total: 231 mg/dL — ABNORMAL HIGH (ref 100–199)
HDL: 61 mg/dL (ref >39)
LDL Chol Calc (NIH): 149 mg/dL — ABNORMAL HIGH (ref 0–99)
Triglycerides: 116 mg/dL (ref 0–149)
VLDL Cholesterol Cal: 21 mg/dL (ref 5–40)

## 2020-09-25 LAB — CBC
Hematocrit: 41.2 % (ref 34.0–46.6)
Hemoglobin: 13.6 g/dL (ref 11.1–15.9)
MCH: 29.8 pg (ref 26.6–33.0)
MCHC: 33 g/dL (ref 31.5–35.7)
MCV: 90 fL (ref 79–97)
Platelets: 265 10*3/uL (ref 150–450)
RBC: 4.57 x10E6/uL (ref 3.77–5.28)
RDW: 13 % (ref 11.7–15.4)
WBC: 8.2 10*3/uL (ref 3.4–10.8)

## 2020-09-25 LAB — TSH: TSH: 2.15 u[IU]/mL (ref 0.450–4.500)

## 2020-09-25 LAB — COMPREHENSIVE METABOLIC PANEL
ALT: 20 IU/L (ref 0–32)
AST: 18 IU/L (ref 0–40)
Albumin/Globulin Ratio: 1.8 (ref 1.2–2.2)
Albumin: 4.6 g/dL (ref 3.8–4.9)
Alkaline Phosphatase: 111 IU/L (ref 44–121)
BUN/Creatinine Ratio: 8 — ABNORMAL LOW (ref 9–23)
BUN: 7 mg/dL (ref 6–24)
Bilirubin Total: 0.4 mg/dL (ref 0.0–1.2)
CO2: 24 mmol/L (ref 20–29)
Calcium: 9.5 mg/dL (ref 8.7–10.2)
Chloride: 104 mmol/L (ref 96–106)
Creatinine, Ser: 0.86 mg/dL (ref 0.57–1.00)
Globulin, Total: 2.6 g/dL (ref 1.5–4.5)
Glucose: 106 mg/dL — ABNORMAL HIGH (ref 65–99)
Potassium: 4.4 mmol/L (ref 3.5–5.2)
Sodium: 142 mmol/L (ref 134–144)
Total Protein: 7.2 g/dL (ref 6.0–8.5)
eGFR: 82 mL/min/{1.73_m2} (ref >59)

## 2020-09-25 LAB — VITAMIN B12: Vitamin B-12: 724 pg/mL (ref 232–1245)

## 2020-09-25 LAB — VITAMIN D 25 HYDROXY (VIT D DEFICIENCY, FRACTURES): Vit D, 25-Hydroxy: 24.8 ng/mL — ABNORMAL LOW (ref 30.0–100.0)

## 2020-10-01 ENCOUNTER — Ambulatory Visit: Payer: BC Managed Care – PPO | Admitting: Obstetrics and Gynecology

## 2020-10-04 ENCOUNTER — Other Ambulatory Visit: Payer: Self-pay

## 2020-10-04 ENCOUNTER — Ambulatory Visit (INDEPENDENT_AMBULATORY_CARE_PROVIDER_SITE_OTHER): Payer: BC Managed Care – PPO | Admitting: Obstetrics and Gynecology

## 2020-10-04 ENCOUNTER — Encounter: Payer: Self-pay | Admitting: Obstetrics and Gynecology

## 2020-10-04 VITALS — BP 132/80 | HR 58 | Ht 62.0 in | Wt 205.0 lb

## 2020-10-04 DIAGNOSIS — Z01419 Encounter for gynecological examination (general) (routine) without abnormal findings: Secondary | ICD-10-CM

## 2020-10-04 DIAGNOSIS — N951 Menopausal and female climacteric states: Secondary | ICD-10-CM

## 2020-10-04 DIAGNOSIS — Z124 Encounter for screening for malignant neoplasm of cervix: Secondary | ICD-10-CM

## 2020-10-04 DIAGNOSIS — Z3042 Encounter for surveillance of injectable contraceptive: Secondary | ICD-10-CM

## 2020-10-04 DIAGNOSIS — Z1239 Encounter for other screening for malignant neoplasm of breast: Secondary | ICD-10-CM

## 2020-10-04 MED ORDER — MEDROXYPROGESTERONE ACETATE 150 MG/ML IM SUSP: INTRAMUSCULAR | 3 refills | Status: DC

## 2020-10-04 NOTE — Patient Instructions (Signed)
[facility] [address] [place] [address]  [facility] [place]  [address] [place]  [address]  Phone: [phone]  

## 2020-10-04 NOTE — Progress Notes (Signed)
Gynecology Annual Exam  PCP: Margaretann Loveless, PA-C  Chief Complaint:  Chief Complaint  Patient presents with   Gynecologic Exam    Annual - Hot flashes on and off. RM 5    History of Present Illness:Patient is a 50 y.o. G2P1011 presents for annual exam. The patient has no complaints today.   LMP: No LMP recorded. Patient has had an injection. Amenorrhea on depo provera with worsening vasomotor symptoms noted in the last year.  The patient is sexually active. She denies dyspareunia.  The patient does perform self breast exams.  There is no notable family history of breast or ovarian cancer in her family.  The patient wears seatbelts: yes.   The patient has regular exercise: not asked.    The patient denies current symptoms of depression.     Review of Systems: Review of Systems  Constitutional:  Negative for chills and fever.  HENT:  Negative for congestion.   Respiratory:  Negative for cough and shortness of breath.   Cardiovascular:  Negative for chest pain and palpitations.  Gastrointestinal:  Negative for abdominal pain, constipation, diarrhea, heartburn, nausea and vomiting.  Genitourinary:  Negative for dysuria, frequency and urgency.  Skin:  Negative for itching and rash.  Neurological:  Negative for dizziness and headaches.  Endo/Heme/Allergies:  Negative for polydipsia.  Psychiatric/Behavioral:  Negative for depression.    Past Medical History:  Patient Active Problem List   Diagnosis Date Noted   Morbid obesity (HCC) 09/24/2020   Chronic fatigue 09/24/2020   Insomnia 09/24/2020   Hypercholesterolemia 08/08/2017   Essential hypertension 08/08/2017   Vertigo 07/17/2017   Allergic rhinitis 06/24/2015    Past Surgical History:  Past Surgical History:  Procedure Laterality Date   COLONOSCOPY WITH PROPOFOL N/A 09/30/2018   Procedure: COLONOSCOPY WITH PROPOFOL;  Surgeon: Wyline Mood, MD;  Location: [facility] ENDOSCOPY;  Service: Gastroenterology;  Laterality: N/A;    WISDOM TOOTH EXTRACTION     AGE 61; ALL FOUR    Gynecologic History:  No LMP recorded. Patient has had an injection. Last Pap: Results were: 6/29/2020NIL and HR HPV negative  Last mammogram: 10/03/2019 Results were: BI-RAD I  Obstetric History: G2P1011  Family History:  Family History  Problem Relation Age of Onset   Healthy Mother    Diabetes Maternal Grandmother    Heart disease Paternal Grandfather        MIs in 38s    Social History:  Social History   Socioeconomic History   Marital status: Divorced    Spouse name: Not on file   Number of children: 1   Years of education: 16   Highest education level: Not on file  Occupational History   Occupation: Pension scheme manager  Tobacco Use   Smoking status: Never   Smokeless tobacco: Never  Vaping Use   Vaping Use: Never used  Substance and Sexual Activity   Alcohol use: Yes    Comment: OCC   Drug use: No   Sexual activity: Yes    Partners: Male    Birth control/protection: Injection    Comment: DEPO  Other Topics Concern   Not on file  Social History Narrative   Not on file   Social Determinants of Health   Financial Resource Strain: Not on file  Food Insecurity: Not on file  Transportation Needs: Not on file  Physical Activity: Not on file  Stress: Not on file  Social Connections: Not on file  Intimate Partner Violence: Not on file  Allergies:  Allergies  Allergen Reactions   Other     Cats   Pollen Extract     Medications: Prior to Admission medications   Medication Sig Start Date End Date Taking? Authorizing Provider  amLODipine (NORVASC) 10 MG tablet TAKE 1 TABLET(10 MG) BY MOUTH DAILY 08/09/20  Yes Bacigalupo, Marzella Schlein, MD  cholecalciferol (VITAMIN D3) 25 MCG (1000 UNIT) tablet Take 1,000 Units by mouth daily.   Yes [provider]  diphenhydrAMINE HCl, Sleep, (ZZZQUIL PO) Take 1 tablet by mouth at bedtime.   Yes [provider]  medroxyPROGESTERone (DEPO-PROVERA)  150 MG/ML injection ADMINISTER 1 ML IN THE MUSCLE EVERY 3 MONTHS 09/30/19  Yes Farrel Conners, CNM  Multiple Vitamin (MULTIVITAMIN) tablet Take 1 tablet by mouth daily.   Yes [provider]  TURMERIC PO Take by mouth.   Yes [provider]  Cyanocobalamin (B-12 PO) Take by mouth daily. Patient not taking: No sig reported    [provider]    Physical Exam Vitals: Blood pressure 132/80, pulse (!) 58, height 5\' 2"  (1.575 m), weight 205 lb (93 kg).  General: NAD HEENT: normocephalic, anicteric Thyroid: no enlargement, no palpable nodules Pulmonary: No increased work of breathing, CTAB Cardiovascular: RRR, distal pulses 2+ Breast: Breast symmetrical, no tenderness, no palpable nodules or masses, no skin or nipple retraction present, no nipple discharge.  No axillary or supraclavicular lymphadenopathy. Abdomen: NABS, soft, non-tender, non-distended.  Umbilicus without lesions.  No hepatomegaly, splenomegaly or masses palpable. No evidence of hernia  Genitourinary:  External: Normal external female genitalia.  Normal urethral meatus, normal Bartholin's and Skene's glands.    Vagina: Normal vaginal mucosa, no evidence of prolapse.    Cervix: Grossly normal in appearance, no bleeding  Uterus: Non-enlarged, mobile, normal contour.  No CMT  Adnexa: ovaries non-enlarged, no adnexal masses  Rectal: deferred  Lymphatic: no evidence of inguinal lymphadenopathy Extremities: no edema, erythema, or tenderness Neurologic: Grossly intact Psychiatric: mood appropriate, affect full  Female chaperone present for pelvic and breast  portions of the physical exam     Assessment: 50 y.o. G2P1011 routine annual exam  Plan: Problem List Items Addressed This Visit   None Visit Diagnoses     Encounter for gynecological examination without abnormal finding    -  Primary   Screening for malignant neoplasm of cervix       Breast screening       Relevant Orders   MM 3D SCREEN  BREAST BILATERAL   Vasomotor symptoms due to menopause       Relevant Orders   FSH   Estradiol   Encounter for surveillance of injectable contraceptive           1) Mammogram - recommend yearly screening mammogram.  Mammogram Was ordered today  2) STI screening  was notoffered and therefore not obtained  3) ASCCP guidelines and rational discussed.  Patient opts for every 3 years screening interval  4) Osteoporosis  - per USPTF routine screening DEXA at age 74  5) Routine healthcare maintenance including cholesterol, diabetes screening discussed managed by PCP  6) Colonoscopy  - up to date age 89  7) Vasomotor symptoms - FSH/estradiol today.  If consistent with menopause discontinue Depo Provera  7) Return in about 1 year (around 10/04/2021) for annual, depo ever 3 months nurse visit.    12/05/2021, MD Vena Austria, [facility] 10/04/2020, 10:47 AM

## 2020-10-05 LAB — ESTRADIOL: Estradiol: <5 pg/mL

## 2020-10-05 LAB — FOLLICLE STIMULATING HORMONE: FSH: 117 m[IU]/mL

## 2020-10-06 LAB — HM MAMMOGRAPHY

## 2020-10-15 ENCOUNTER — Encounter: Payer: Self-pay | Admitting: Family Medicine

## 2020-10-20 ENCOUNTER — Other Ambulatory Visit: Admission: RE | Admit: 2020-10-20 | Payer: BC Managed Care – PPO | Source: Ambulatory Visit

## 2020-10-22 ENCOUNTER — Ambulatory Visit: Payer: BC Managed Care – PPO

## 2020-11-05 ENCOUNTER — Other Ambulatory Visit: Payer: Self-pay | Admitting: Family Medicine

## 2020-11-05 DIAGNOSIS — I10 Essential (primary) hypertension: Secondary | ICD-10-CM

## 2020-11-11 ENCOUNTER — Ambulatory Visit: Payer: BC Managed Care – PPO

## 2020-12-28 ENCOUNTER — Ambulatory Visit: Payer: Self-pay | Admitting: *Deleted

## 2020-12-28 NOTE — Telephone Encounter (Signed)
Patient is calling to report she has fever, sore throat, congestion,headache that started Saturday. Patient reports she has been treating her symptoms with Tylenol. 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 Patient advised to continue her Tylenol and hydration. Patient will call office if her COVID test returns positive. If her test is negative- she will do virtual visit with UC/PCP for follow up evaluation of symptoms.

## 2020-12-28 NOTE — Telephone Encounter (Signed)
Summary: fever of 99.9   Pt had a fever this morning of 99.9 / that is lowest its been since Saturday /she stated she has been taking tylenol but her fever keeps coming back / last time she took ,medication was 6:40am this morning / Pt is asking what she should should do about the fever / pt was also experiencing a sore throat but that has gone and she has congestion and a headache / pt took a covid test yesterday and is waiting for results/ please advise      Reason for Disposition  Fever present > 3 days (72 hours)  Answer Assessment - Initial Assessment Questions 1. TEMPERATURE: "What is the most recent temperature?"  "How was it measured?"      99.9- oral 2. ONSET: "When did the fever start?"      Saturday 3. CHILLS: "Do you have chills?" If yes: "How bad are they?"  (e.g., none, mild, moderate, severe)   - NONE: no chills   - MILD: feeling cold   - MODERATE: feeling very cold, some shivering (feels better under a thick blanket)   - SEVERE: feeling extremely cold with shaking chills (general body shaking, rigors; even under a thick blanket)      no 4. OTHER SYMPTOMS: "Do you have any other symptoms besides the fever?"  (e.g., abdomen pain, cough, diarrhea, earache, headache, sore throat, urination pain)     UR symptoms 5. CAUSE: If there are no symptoms, ask: "What do you think is causing the fever?"      unsure 6. CONTACTS: "Does anyone else in the family have an infection?"     no 7. TREATMENT: "What have you done so far to treat this fever?" (e.g., medications)     Tylenol-rapid release 8. IMMUNOCOMPROMISE: "Do you have of the following: diabetes, HIV positive, splenectomy, cancer chemotherapy, chronic steroid treatment, transplant patient, etc."     no 9. PREGNANCY: "Is there any chance you are pregnant?" "When was your last menstrual period?"     N/a 10. TRAVEL: "Have you traveled out of the country in the last month?" (e.g., travel history, exposures)       No-works in the  school system  Protocols used: [facility]

## 2021-02-21 ENCOUNTER — Telehealth: Payer: Self-pay

## 2021-02-21 NOTE — Telephone Encounter (Signed)
Copied from CRM [id number]. 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

## 2021-02-21 NOTE — Telephone Encounter (Signed)
Contacted patient and she complains of shoulder pain x 4 weeks after working out and thumb pain < 3 days. [facility] does not accept patients insurance, patient states that Tuesday and Thursday would not be a good day this week, Wednesday she reports a meeting at 61 but states that she is free any other time from 11-12, can you please look over schedule and let me know if you could possibly squeeze patient in Friday? KW

## 2021-02-25 ENCOUNTER — Ambulatory Visit: Payer: BC Managed Care – PPO | Admitting: Family Medicine

## 2021-02-25 ENCOUNTER — Encounter: Payer: Self-pay | Admitting: Family Medicine

## 2021-02-25 ENCOUNTER — Other Ambulatory Visit: Payer: Self-pay

## 2021-02-25 VITALS — BP 129/84 | HR 80 | Resp 16 | Ht 62.0 in | Wt 209.0 lb

## 2021-02-25 DIAGNOSIS — M25512 Pain in left shoulder: Secondary | ICD-10-CM | POA: Diagnosis not present

## 2021-02-25 DIAGNOSIS — M65312 Trigger thumb, left thumb: Secondary | ICD-10-CM | POA: Insufficient documentation

## 2021-02-25 DIAGNOSIS — I10 Essential (primary) hypertension: Secondary | ICD-10-CM

## 2021-02-25 MED ORDER — METAXALONE 800 MG PO TABS
800.0000 mg | ORAL_TABLET | Freq: Three times a day (TID) | ORAL | 0 refills | Status: DC | PRN
Start: 2021-02-25 — End: 2022-05-05

## 2021-02-25 MED ORDER — MELOXICAM 15 MG PO TABS: 15.0000 mg | ORAL_TABLET | Freq: Every day | ORAL | 0 refills | Status: DC

## 2021-02-25 MED ORDER — CYCLOBENZAPRINE HCL 10 MG PO TABS: 10.0000 mg | ORAL_TABLET | Freq: Every day | ORAL | 0 refills | Status: DC

## 2021-02-25 NOTE — Progress Notes (Signed)
I,April Miller,acting as a scribe for Jacky Kindle, FNP.,have documented all relevant documentation on the behalf of Jacky Kindle, FNP,as directed by  Jacky Kindle, FNP while in the presence of Jacky Kindle, FNP.  Established patient visit   Patient: Cassidy Kelly   DOB: 1970-09-20   50 y.o. Female  MRN: [id number] Visit Date: 02/25/2021  Today's healthcare provider: Jacky Kindle, FNP   Chief Complaint  Patient presents with   Shoulder Pain   Subjective    Shoulder Pain  The pain is present in the left shoulder, left fingers and neck. This is a new problem. The current episode started more than 1 month ago. There has been no history of extremity trauma. The problem occurs intermittently. The quality of the pain is described as aching, sharp and pounding. The pain is at a severity of 7/10. The pain is moderate. Associated symptoms include an inability to bear weight, joint locking, a limited range of motion, numbness, stiffness and tingling. Pertinent negatives include no fever, itching or joint swelling. The symptoms are aggravated by activity and lying down. She has tried cold, heat, rest, NSAIDS, OTC pain meds and acetaminophen for the symptoms. The treatment provided mild relief.     Patient has had left shoulder pain for around one month. Patient states she thinks she injured her shoulder while she was exercising. Pain is intermittent, mostly with position or certain movents. Patient has been treating symptoms with Aleve, Tylenol, ice, heat and episome salt baths. Patient states treatments only give her mild relief.   Medications: Outpatient Medications Prior to Visit  Medication Sig   amLODipine (NORVASC) 10 MG tablet TAKE 1 TABLET(10 MG) BY MOUTH DAILY   cholecalciferol (VITAMIN D3) 25 MCG (1000 UNIT) tablet Take 1,000 Units by mouth daily.   Multiple Vitamin (MULTIVITAMIN) tablet Take 1 tablet by mouth daily.   TURMERIC PO Take by mouth.   [DISCONTINUED]  Cyanocobalamin (B-12 PO) Take by mouth daily. (Patient not taking: Reported on 03/15/2020)   [DISCONTINUED] diphenhydrAMINE HCl, Sleep, (ZZZQUIL PO) Take 1 tablet by mouth at bedtime. (Patient not taking: Reported on 02/25/2021)   [DISCONTINUED] medroxyPROGESTERone (DEPO-PROVERA) 150 MG/ML injection ADMINISTER 1 ML IN THE MUSCLE EVERY 3 MONTHS (Patient not taking: Reported on 02/25/2021)   No facility-administered medications prior to visit.    Review of Systems  Constitutional:  Negative for appetite change, chills, fatigue and fever.  Respiratory:  Negative for chest tightness and shortness of breath.   Cardiovascular:  Negative for chest pain and palpitations.  Gastrointestinal:  Negative for abdominal pain, nausea and vomiting.  Musculoskeletal:  Positive for stiffness.  Skin:  Negative for itching.  Neurological:  Positive for tingling and numbness. Negative for dizziness and weakness.      Objective    BP 129/84 (BP Location: Right Arm, Patient Position: Sitting, Cuff Size: Large)   Pulse 80   Resp 16   Ht 5\' 2"  (1.575 m)   Wt 209 lb (94.8 kg)   SpO2 98%   BMI 38.23 kg/m    Physical Exam Vitals and nursing note reviewed.  Constitutional:      General: She is not in acute distress.    Appearance: Normal appearance. She is obese. She is not ill-appearing, toxic-appearing or diaphoretic.  HENT:     Head: Normocephalic and atraumatic.  Cardiovascular:     Rate and Rhythm: Normal rate and regular rhythm.     Pulses: Normal pulses.  Heart sounds: Normal heart sounds. No murmur heard.   No friction rub. No gallop.  Pulmonary:     Effort: Pulmonary effort is normal. No respiratory distress.     Breath sounds: Normal breath sounds. No stridor. No wheezing, rhonchi or rales.  Chest:     Chest wall: No tenderness.  Abdominal:     General: Bowel sounds are normal.     Palpations: Abdomen is soft.  Musculoskeletal:        General: Swelling and tenderness present. No  deformity or signs of injury.     Right lower leg: No edema.     Left lower leg: No edema.     Comments: L shoulder:  Limited flexion of shoulder- when arm reaches level of shoulder Unable to abduct Unable to perform extension Minimal external rotation Adduction limited by body habitus Horizontal abduction limited by pain Horizontal adduction limited by breast size/body habitus   L thumb:  Trigger thumb, slight edema/pain around MP joint  Skin:    General: Skin is warm and dry.     Capillary Refill: Capillary refill takes less than 2 seconds.     Coloration: Skin is not jaundiced or pale.     Findings: No bruising, erythema, lesion or rash.  Neurological:     General: No focal deficit present.     Mental Status: She is alert and oriented to person, place, and time. Mental status is at baseline.     Cranial Nerves: No cranial nerve deficit.     Sensory: No sensory deficit.     Motor: No weakness.     Coordination: Coordination normal.  Psychiatric:        Mood and Affect: Mood normal.        Behavior: Behavior normal.        Thought Content: Thought content normal.        Judgment: Judgment normal.     No results found for any visits on 02/25/21.  Assessment & Plan     Problem List Items Addressed This Visit       Cardiovascular and Mediastinum   Essential hypertension    Chronic, stable Continue to work on diet/exercise Continue medications as prescribed        Musculoskeletal and Integument   Trigger finger of left thumb    Advised stretching of the thumb to assist with popping sensation Advised use of mobic to assist with inflammatory process Advised injections may be of use to assist- pt refused to even think about that being necessary if NSAIDs did not work        Other   Morbid obesity (HCC)    BMI 38.23 -HTN -HLD Discussed importance of healthy weight management Discussed diet and exercise       Acute pain of left shoulder - Primary    1  month hx of change in ROM as well as pain S/s likely impingement d/t outstretched arm while doing row with R arm 'renegade row' Use of muscle relaxants and Mobic to assist; referrals placed to sports medicine      Relevant Orders   Ambulatory referral to Sports Medicine   Ambulatory referral to Sports Medicine     Return if symptoms worsen or fail to improve.      Leilani Merl, FNP, have reviewed all documentation for this visit. The documentation on 02/25/21 for the exam, diagnosis, procedures, and orders are all accurate and complete.    Jacky Kindle, FNP  [facility] [phone] (phone) [phone] (fax)  [place]

## 2021-02-25 NOTE — Assessment & Plan Note (Signed)
Chronic, stable Continue to work on diet/exercise Continue medications as prescribed

## 2021-02-25 NOTE — Assessment & Plan Note (Signed)
Advised stretching of the thumb to assist with popping sensation Advised use of mobic to assist with inflammatory process Advised injections may be of use to assist- pt refused to even think about that being necessary if NSAIDs did not work

## 2021-02-25 NOTE — Telephone Encounter (Signed)
Contacted patient back and scheduled her at 9:40 to be evaluated by you. KW

## 2021-02-25 NOTE — Assessment & Plan Note (Signed)
1 month hx of change in ROM as well as pain S/s likely impingement d/t outstretched arm while doing row with R arm 'renegade row' Use of muscle relaxants and Mobic to assist; referrals placed to sports medicine

## 2021-02-25 NOTE — Assessment & Plan Note (Signed)
BMI 38.23 -HTN -HLD Discussed importance of healthy weight management Discussed diet and exercise

## 2021-03-15 ENCOUNTER — Encounter: Payer: Self-pay | Admitting: Sports Medicine

## 2021-03-15 ENCOUNTER — Ambulatory Visit: Payer: BC Managed Care – PPO | Admitting: Sports Medicine

## 2021-03-15 VITALS — BP 126/88 | Ht 62.0 in | Wt 206.0 lb

## 2021-03-15 DIAGNOSIS — S46002A Unspecified injury of muscle(s) and tendon(s) of the rotator cuff of left shoulder, initial encounter: Secondary | ICD-10-CM

## 2021-03-15 DIAGNOSIS — M65312 Trigger thumb, left thumb: Secondary | ICD-10-CM | POA: Diagnosis not present

## 2021-03-15 DIAGNOSIS — M25512 Pain in left shoulder: Secondary | ICD-10-CM | POA: Diagnosis not present

## 2021-03-15 NOTE — Progress Notes (Addendum)
° °  SUBJECTIVE:   CHIEF COMPLAINT / HPI:    Cassidy Kelly is a 50 y.o. female here for left shoulder pain.  Pt reports for the past two months she has had posterior left shoulder pain. First began when she was drying her back off after a shower.  Pain described as pulling but previously was more of a jolting sensation.  Pain worse at night.  Had difficulty lifting her shoulder which has improved with Mobic, muscle relaxer, heat and ice.  At times pain can radiate down her arm, she does have hx of bilateral carpal tunnel.    Has left thumb locking for the past 3 weeks. Notes it locks randomly. She works out with her Systems analyst 5 times a week (3 days strength training). She is right handed.    PERTINENT  PMH / PSH: reviewed and updated as appropriate   OBJECTIVE:   BP 126/88    Ht 5\' 2"  (1.575 m)    Wt 206 lb (93.4 kg)    BMI 37.68 kg/m    GEN: well appearing female in no acute distress  CVS: well perfused  RESP: speaking in full sentences without pause, no respiratory distress  MSK left shoulder:  -No evidence of bony deformity, asymmetry, or muscle atrophy -No tenderness over long head of biceps (bicipital groove). No TTP at [facility] joint. -Full active and passive range of motion,  -Strength 5/5 ADD, ABD, IR, ER  -No abnormal scapular function observed. Sensation intact. Peripheral pulses intact.  Special Tests: Hawkins: Positive; Empty can: Positive; Neer's: Negative; Painful arc: Negative; Anterior Apprehension: Negative,   Left Thumb: normal ROM though has PIP locking, non-tender. Strength 5/5 throughout.   ASSESSMENT/PLAN:    Left Shoulder Pain   Rotator cuff injury  Pt is a 50 yo female with left shoulder pain for the past 2 month. Initially had decreased ROM. Exam concerning for rotator cuff injury, supraspinatus.  Discussed treatment options. Treat conservatively with physical therapy given her improvement. Avoid overhead exercises. Continue mobic and muscle relaxer.  Follow up in 1 month.   Left trigger thumb  Discussed treatment option. Start with Band-Aid splint at bedtime for 1 week. Continue taking NSAID as above. Follow up after the new year.  If suboptimal response, consider corticosteroid injection or referral to hand surgery.   42, DO PGY-3, [facility] 03/15/2021     Patient seen and evaluated with the resident.  I agree with the above plan of care.  Would like for the patient to start formal physical therapy for her left shoulder injury.  Follow-up in 4 weeks for reevaluation.  If symptoms or not improving consider imaging at that time including ultrasound. For her left trigger thumb we will have her start with a Band-Aid splint for 1 week.  Continue with her anti-inflammatory.  If symptoms do not improve over the next week or 2 then she will return to the office sooner than her scheduled appointment in 4 weeks for consideration of a cortisone injection.

## 2021-03-15 NOTE — Patient Instructions (Signed)
Trigger thumb:  place two Band-Aids around your finger at night to prevent you from bending your thumb. Continue taking Meloxicam for pain and for it's inflammatory properties. If this doesn't help your thumb, follow up after the new year.     For your shoulder: continue taking Meloxicam and your muscle relaxer as prescribed. Work with the physical therapist for 4 weeks and follow up with [place].  Remember to avoid overhead exercises.    Take Care,   [facility]

## 2021-03-24 ENCOUNTER — Other Ambulatory Visit: Payer: Self-pay | Admitting: Family Medicine

## 2021-03-24 NOTE — Telephone Encounter (Signed)
Requested Prescriptions  Pending Prescriptions Disp Refills   meloxicam (MOBIC) 15 MG tablet [Pharmacy Med Name: MELOXICAM 15MG  TABLETS] 30 tablet 0    Sig: TAKE 1 TABLET(15 MG) BY MOUTH DAILY     Analgesics:  COX2 Inhibitors Passed - 03/24/2021  3:29 AM      Passed - HGB in normal range and within 360 days    Hemoglobin  Date Value Ref Range Status  09/24/2020 13.6 11.1 - 15.9 g/dL Final         Passed - Cr in normal range and within 360 days    Creat  Date Value Ref Range Status  10/11/2015 0.94 0.50 - 1.10 mg/dL Final   Creatinine, Ser  Date Value Ref Range Status  09/24/2020 0.86 0.57 - 1.00 mg/dL Final         Passed - Patient is not pregnant      Passed - Valid encounter within last 12 months    Recent Outpatient Visits          3 weeks ago Acute pain of left shoulder   [facility], FNP   6 months ago Essential hypertension   [facility] [place], [place], MD   1 year ago Chronic fatigue   [facility] [place], [place], Alessandra Bevels   1 year ago Essential hypertension   [facility] [place], [place], Alessandra Bevels   2 years ago Essential hypertension   [facility] [place], [place], Alessandra Bevels      Future Appointments            In 1 week [place], FNP [facility], [facility]

## 2021-04-01 ENCOUNTER — Ambulatory Visit: Payer: Self-pay | Admitting: Family Medicine

## 2021-04-27 NOTE — Progress Notes (Signed)
Acute Office Visit  Subjective:    Patient ID: Cassidy Kelly, female    DOB: [birth year], 51 y.o.   MRN: [id number]  Today's Provider: Talitha Givens, MHS, PA-C Introduced myself to the patient as a PA-C and provided education on APPs in clinical practice.      HPI Patient is in today for evaluation of decreased energy, sleep disturbance and weight issues.  Sleep: has been an ongoing issue since 2017  Reports she is able to fall asleep without issue but is unable to stay asleep States she wakes up several times throughout the night and intermittently has trouble returning to sleep Thinks she gets approx 3 hours of sleep per night Has been going to bed earlier the past few weeks- around 7-8pm but wakes up at midnight Denies anxiety when she wakes up  States she does have some racing thoughts before bed  She has tried all manner of sleep hygiene techniques   Decreased energy:  She is taking Vitamin D with her normal vitamin  She reports her energy levels have been decreasing She states she is having issues with daytime drowsiness and dozing off during the day - while actively engaged in activities  States she feels like she is having "time skips" throughout the day  Weight:  States she exercising with a personal trainer 5 times per week since July 2022 She has increased her water intake to about a gallon per day and is not longer drinking as much soda She is monitoring portions with recommended measurements She is not eating after 8pm   Exercise: She is doing a mix of strength, cardio  More emphasis on cardio to help prevent shoulder injuries as of mid- Jan Reports sessions are typically 45 minutes     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   Hypercholesterolemia    Hypertension    Obesity (BMI 30-39.9)     Past Surgical History:  Procedure Laterality Date   COLONOSCOPY WITH PROPOFOL N/A 09/30/2018   Procedure: COLONOSCOPY WITH  PROPOFOL;  Surgeon: Jonathon Bellows, MD;  Location: [facility] ENDOSCOPY;  Service: Gastroenterology;  Laterality: N/A;   WISDOM TOOTH EXTRACTION     AGE 64; ALL FOUR    Family History  Problem Relation Age of Onset   Healthy Mother    Diabetes Maternal Grandmother    Heart disease Paternal Grandfather        MIs in 15s    Social History   Socioeconomic History   Marital status: Divorced    Spouse name: Not on file   Number of children: 1   Years of education: 16   Highest education level: Not on file  Occupational History   Occupation: Chief Technology Officer  Tobacco Use   Smoking status: Never   Smokeless tobacco: Never  Vaping Use   Vaping Use: Never used  Substance and Sexual Activity   Alcohol use: Yes    Comment: OCC   Drug use: No   Sexual activity: Yes    Partners: Male    Birth control/protection: Injection    Comment: DEPO  Other Topics Concern   Not on file  Social History Narrative   Not on file   Social Determinants of Health   Financial Resource Strain: Not on file  Food Insecurity: Not on file  Transportation Needs: Not on file  Physical Activity: Not on file  Stress: Not on file  Social Connections: Not on file  Intimate Partner Violence: Not on file    Outpatient Medications Prior to Visit  Medication Sig Dispense Refill   amLODipine (NORVASC) 10 MG tablet TAKE 1 TABLET(10 MG) BY MOUTH DAILY 90 tablet 1   cholecalciferol (VITAMIN D3) 25 MCG (1000 UNIT) tablet Take 1,000 Units by mouth daily.     cyclobenzaprine (FLEXERIL) 10 MG tablet Take 1 tablet (10 mg total) by mouth at bedtime. 30 tablet 0   meloxicam (MOBIC) 15 MG tablet TAKE 1 TABLET(15 MG) BY MOUTH DAILY 30 tablet 0   Multiple Vitamin (MULTIVITAMIN) tablet Take 1 tablet by mouth daily.     TURMERIC PO Take by mouth.     metaxalone (SKELAXIN) 800 MG tablet Take 1 tablet (800 mg total) by mouth 3 (three) times daily as needed for muscle spasms. (Patient not taking: Reported on 04/28/2021) 90  tablet 0   No facility-administered medications prior to visit.    Allergies  Allergen Reactions   Other     Cats   Pollen Extract     Review of Systems  Constitutional:  Positive for fatigue. Negative for chills, diaphoresis and unexpected weight change.  Eyes:  Negative for visual disturbance.  Gastrointestinal:  Negative for constipation, diarrhea, nausea and vomiting.  Musculoskeletal:  Negative for arthralgias, back pain and myalgias.  Neurological:  Negative for dizziness, syncope, light-headedness and headaches.  Psychiatric/Behavioral:  Positive for sleep disturbance.       Objective:    Physical Exam Vitals reviewed.  Constitutional:      Appearance: Normal appearance. She is obese.  HENT:     Head: Normocephalic and atraumatic.  Eyes:     General: Lids are normal.     Extraocular Movements: Extraocular movements intact.     Conjunctiva/sclera: Conjunctivae normal.     Pupils: Pupils are equal, round, and reactive to light.  Neck:     Thyroid: No thyroid mass or thyroid tenderness.  Cardiovascular:     Rate and Rhythm: Normal rate and regular rhythm.     Pulses: Normal pulses.     Heart sounds: Normal heart sounds.  Pulmonary:     Effort: Pulmonary effort is normal.     Breath sounds: Normal breath sounds and air entry. No decreased breath sounds, wheezing, rhonchi or rales.  Musculoskeletal:     Cervical back: Normal range of motion.     Right lower leg: No edema.     Left lower leg: No edema.  Neurological:     Mental Status: She is alert.  Psychiatric:        Attention and Perception: Attention and perception normal.        Mood and Affect: Mood and affect normal.        Speech: Speech normal.        Behavior: Behavior normal. Behavior is cooperative.        Thought Content: Thought content normal.        Cognition and Memory: Cognition normal.    BP (!) 153/95 (BP Location: Left Arm, Patient Position: Sitting, Cuff Size: Normal)    Pulse 72     Temp 98.4 F (36.9 C)    Ht _0  (1.575 m)    Wt 218 lb 8 oz (99.1 kg)    SpO2 100%    BMI 39.96 kg/m  Wt Readings from Last 3 Encounters:  04/28/21 218 lb 8 oz (99.1 kg)  03/15/21 206 lb (93.4 kg)  02/25/21 209 lb (94.8 kg)    Health Maintenance Due  Topic Date Due   Zoster Vaccines- Shingrix (1 of 2) Never done   COVID-19 Vaccine (5 - Booster for Pfizer series) 09/11/2020   INFLUENZA VACCINE  Never done    There are no preventive care reminders to display for this patient.   Lab Results  Component Value Date   TSH 2.150 09/24/2020   Lab Results  Component Value Date   WBC 8.2 09/24/2020   HGB 13.6 09/24/2020   HCT 41.2 09/24/2020   MCV 90 09/24/2020   PLT 265 09/24/2020   Lab Results  Component Value Date   NA 142 09/24/2020   K 4.4 09/24/2020   CO2 24 09/24/2020   GLUCOSE 106 (H) 09/24/2020   BUN 7 09/24/2020   CREATININE 0.86 09/24/2020   BILITOT 0.4 09/24/2020   ALKPHOS 111 09/24/2020   AST 18 09/24/2020   ALT 20 09/24/2020   PROT 7.2 09/24/2020   ALBUMIN 4.6 09/24/2020   CALCIUM 9.5 09/24/2020   ANIONGAP 10 06/14/2017   EGFR 82 09/24/2020   Lab Results  Component Value Date   CHOL 231 (H) 09/24/2020   Lab Results  Component Value Date   HDL 61 09/24/2020   Lab Results  Component Value Date   LDLCALC 149 (H) 09/24/2020   Lab Results  Component Value Date   TRIG 116 09/24/2020   Lab Results  Component Value Date   CHOLHDL 3.8 09/24/2020   Lab Results  Component Value Date   HGBA1C 5.5 07/24/2019       Assessment & Plan:     Problem List Items Addressed This Visit       Other   Hypercholesterolemia    Chronic, historic problem, stable Currently managed with lifestyle modifications  Lipid panel ordered today for monitoring  Encouraged continuation of exercise and diet efforts  Follow up and management to be dictated by results       Relevant Orders   Lipid Profile   Morbid obesity ([place])    Chronic, historic condition,  stable but not responding to lifestyle modifications Patient is exercising 45 min, 5x per week  Making diet changes that are portion conscious  She is concerned for inability to lose central weight and states her clothing is feeling tighter  Discussed that she may potentially have sleep apnea - patient has agreed to sleep study-polysomnography to assess for this Also ordered labs today  Encouraged continuation of exercise and diet measures.  Follow up in 3 months - preferably after sleep study has been completed.       Relevant Orders   Lipid Profile   Ambulatory referral to Sleep Studies   HgB A1c   Nocturnal polysomnography (NPSG)   Chronic fatigue - Primary    Chronic, historic condition, stable but has not been improving with lifestyle changes Patient states she is exercising frequently but she is not sleeping more than 3 hours per night Referral to sleep study for polysomnography Labs to include CBC, CMP, TSH, Vitamin D and B12 to rule out anemia, vitamin deficiency, electrolyte derangements, thyroid concerns as potential etiologies.  Results to dictate management Follow up in 3 months to discuss results       Relevant Orders   TSH   Comprehensive Metabolic Panel (CMET)   CBC w/Diff/Platelet   Vitamin D (25 hydroxy)   Ambulatory referral to Sleep Studies   B12 and Folate Panel   HgB A1c   Nocturnal polysomnography (NPSG)   Insomnia    Chronic, historic problem, stable but not  improving Recommend sleep study with polysomnography to determine if there is underlying OSA causing maintenance insomnia Recommend she continue with sleep hygiene efforts, exercise to assist with sleep efforts  follow up in 3 months/ after sleep study to discuss results       Relevant Orders   Ambulatory referral to Sleep Studies   Nocturnal polysomnography (NPSG)     Return in about 3 months (around 07/26/2021).   I, Kataleia Quaranta E Jamerica Snavely, PA-C, have reviewed all documentation for this visit. The  documentation on 04/28/21 for the exam, diagnosis, procedures, and orders are all accurate and complete.   Taliya Mcclard, [place]ie [place] MPH [facility]

## 2021-04-28 ENCOUNTER — Encounter: Payer: Self-pay | Admitting: Physician Assistant

## 2021-04-28 ENCOUNTER — Other Ambulatory Visit: Payer: Self-pay

## 2021-04-28 ENCOUNTER — Ambulatory Visit: Payer: BC Managed Care – PPO | Admitting: Physician Assistant

## 2021-04-28 VITALS — BP 153/95 | HR 72 | Temp 98.4°F | Ht 62.0 in | Wt 218.5 lb

## 2021-04-28 DIAGNOSIS — R5382 Chronic fatigue, unspecified: Secondary | ICD-10-CM | POA: Diagnosis not present

## 2021-04-28 DIAGNOSIS — G4700 Insomnia, unspecified: Secondary | ICD-10-CM

## 2021-04-28 DIAGNOSIS — E7800 Pure hypercholesterolemia, unspecified: Secondary | ICD-10-CM | POA: Diagnosis not present

## 2021-04-28 NOTE — Assessment & Plan Note (Signed)
Chronic, historic condition, stable but not responding to lifestyle modifications Patient is exercising 45 min, 5x per week  Making diet changes that are portion conscious  She is concerned for inability to lose central weight and states her clothing is feeling tighter  Discussed that she may potentially have sleep apnea - patient has agreed to sleep study-polysomnography to assess for this Also ordered labs today  Encouraged continuation of exercise and diet measures.  Follow up in 3 months - preferably after sleep study has been completed.

## 2021-04-28 NOTE — Assessment & Plan Note (Signed)
Chronic, historic problem, stable but not improving Recommend sleep study with polysomnography to determine if there is underlying OSA causing maintenance insomnia Recommend she continue with sleep hygiene efforts, exercise to assist with sleep efforts  follow up in 3 months/ after sleep study to discuss results

## 2021-04-28 NOTE — Assessment & Plan Note (Signed)
Chronic, historic condition, stable but has not been improving with lifestyle changes Patient states she is exercising frequently but she is not sleeping more than 3 hours per night Referral to sleep study for polysomnography Labs to include CBC, CMP, TSH, Vitamin D and B12 to rule out anemia, vitamin deficiency, electrolyte derangements, thyroid concerns as potential etiologies.  Results to dictate management Follow up in 3 months to discuss results

## 2021-04-28 NOTE — Assessment & Plan Note (Signed)
Chronic, historic problem, stable Currently managed with lifestyle modifications  Lipid panel ordered today for monitoring  Encouraged continuation of exercise and diet efforts  Follow up and management to be dictated by results

## 2021-04-28 NOTE — Patient Instructions (Addendum)
° °  Based our visit today I would like to check a few things as potential causes for your concerns Please come back, fasted, for your lab work at your convenience  I have sent in a referral for a sleep study to help decide if there are more organic causes for your insomnia and fatigue. I will send you an update with the results of your lab work when they are available.   Please keep up the great work with your exercise and diet efforts.   It was nice to meet you and I appreciate the opportunity to be involved in your care

## 2021-04-29 ENCOUNTER — Other Ambulatory Visit: Payer: Self-pay

## 2021-04-29 DIAGNOSIS — R5382 Chronic fatigue, unspecified: Secondary | ICD-10-CM

## 2021-04-29 DIAGNOSIS — E7800 Pure hypercholesterolemia, unspecified: Secondary | ICD-10-CM

## 2021-04-30 LAB — CBC WITH DIFFERENTIAL/PLATELET
Basophils Absolute: 0.1 10*3/uL (ref 0.0–0.2)
Basos: 1 %
EOS (ABSOLUTE): 0.1 10*3/uL (ref 0.0–0.4)
Eos: 1 %
Hematocrit: 35.1 % (ref 34.0–46.6)
Hemoglobin: 12 g/dL (ref 11.1–15.9)
Immature Grans (Abs): 0 10*3/uL (ref 0.0–0.1)
Immature Granulocytes: 0 %
Lymphocytes Absolute: 3 10*3/uL (ref 0.7–3.1)
Lymphs: 37 %
MCH: 30.2 pg (ref 26.6–33.0)
MCHC: 34.2 g/dL (ref 31.5–35.7)
MCV: 88 fL (ref 79–97)
Monocytes Absolute: 0.4 10*3/uL (ref 0.1–0.9)
Monocytes: 5 %
Neutrophils Absolute: 4.3 10*3/uL (ref 1.4–7.0)
Neutrophils: 56 %
Platelets: 241 10*3/uL (ref 150–450)
RBC: 3.98 x10E6/uL (ref 3.77–5.28)
RDW: 13.2 % (ref 11.7–15.4)
WBC: 7.9 10*3/uL (ref 3.4–10.8)

## 2021-04-30 LAB — LIPID PANEL
Chol/HDL Ratio: 3.6 ratio (ref 0.0–4.4)
Cholesterol, Total: 261 mg/dL — ABNORMAL HIGH (ref 100–199)
HDL: 73 mg/dL (ref >39)
LDL Chol Calc (NIH): 174 mg/dL — ABNORMAL HIGH (ref 0–99)
Triglycerides: 83 mg/dL (ref 0–149)
VLDL Cholesterol Cal: 14 mg/dL (ref 5–40)

## 2021-04-30 LAB — COMPREHENSIVE METABOLIC PANEL
ALT: 42 IU/L — ABNORMAL HIGH (ref 0–32)
AST: 28 IU/L (ref 0–40)
Albumin/Globulin Ratio: 1.9 (ref 1.2–2.2)
Albumin: 4.6 g/dL (ref 3.8–4.9)
Alkaline Phosphatase: 134 IU/L — ABNORMAL HIGH (ref 44–121)
BUN/Creatinine Ratio: 14 (ref 9–23)
BUN: 12 mg/dL (ref 6–24)
Bilirubin Total: 0.3 mg/dL (ref 0.0–1.2)
CO2: 23 mmol/L (ref 20–29)
Calcium: 9.5 mg/dL (ref 8.7–10.2)
Chloride: 102 mmol/L (ref 96–106)
Creatinine, Ser: 0.83 mg/dL (ref 0.57–1.00)
Globulin, Total: 2.4 g/dL (ref 1.5–4.5)
Glucose: 117 mg/dL — ABNORMAL HIGH (ref 70–99)
Potassium: 4.1 mmol/L (ref 3.5–5.2)
Sodium: 138 mmol/L (ref 134–144)
Total Protein: 7 g/dL (ref 6.0–8.5)
eGFR: 85 mL/min/{1.73_m2} (ref >59)

## 2021-04-30 LAB — HEMOGLOBIN A1C
Est. average glucose Bld gHb Est-mCnc: 131 mg/dL
Hgb A1c MFr Bld: 6.2 % — ABNORMAL HIGH (ref 4.8–5.6)

## 2021-04-30 LAB — TSH: TSH: 2.59 u[IU]/mL (ref 0.450–4.500)

## 2021-04-30 LAB — B12 AND FOLATE PANEL
Folate: >20 ng/mL (ref >3.0)
Vitamin B-12: 516 pg/mL (ref 232–1245)

## 2021-04-30 LAB — VITAMIN D 25 HYDROXY (VIT D DEFICIENCY, FRACTURES): Vit D, 25-Hydroxy: 32.3 ng/mL (ref 30.0–100.0)

## 2021-05-02 ENCOUNTER — Telehealth: Payer: Self-pay

## 2021-05-02 NOTE — Telephone Encounter (Signed)
Please see referral notes

## 2021-05-02 NOTE — Telephone Encounter (Signed)
Copied from [place] [id number]. Topic: General - Other >> Apr 28, 2021  4:43 PM Parke Poisson wrote: Reason for CRM: You have put an order in for ambulatory referral to sleep studies. I will need you to put specific order for which test you want her to have,If not it holds up the process for patient getting scheduled,Thamks

## 2021-05-13 ENCOUNTER — Other Ambulatory Visit: Payer: Self-pay | Admitting: Family Medicine

## 2021-05-13 DIAGNOSIS — I10 Essential (primary) hypertension: Secondary | ICD-10-CM

## 2021-05-13 NOTE — Telephone Encounter (Signed)
Requested medication (s) are due for refill today: yes  Requested medication (s) are on the active medication list: yes  Last refill:  11/05/20 #90 with 1 RF  Future visit scheduled: 07/19/21  Notes to clinic:  Rx needs signature, please assess.  Requested Prescriptions  Pending Prescriptions Disp Refills   amLODipine (NORVASC) 10 MG tablet 90 tablet 1     Cardiovascular: Calcium Channel Blockers 2 Failed - 05/13/2021  2:09 PM      Failed - Last BP in normal range    BP Readings from Last 1 Encounters:  04/28/21 (!) 153/95          Passed - Last Heart Rate in normal range    Pulse Readings from Last 1 Encounters:  04/28/21 72          Passed - Valid encounter within last 6 months    Recent Outpatient Visits           2 weeks ago Chronic fatigue   [employer] [facility], Oswaldo Conroy, PA-C   2 months ago Acute pain of left shoulder   [facility] Jacky Kindle, FNP   7 months ago Essential hypertension   [facility] Fran[facility], Marzella Schlein, MD   1 year ago Chronic fatigue   [facility] [place], Alessandra [place], [place]   1 year ago Essential hypertension   [facility] [place], Alessandra [place], [place]       Future Appointments             In 2 months Suzie Portela, Daryl Eastern, FNP [facility]

## 2021-05-13 NOTE — Telephone Encounter (Signed)
Medication Refill - Medication:  amLODipine (NORVASC) 10 MG tablet   Has the patient contacted their pharmacy? Yes.   Contact PCP-already sent a request but didn't get a response  Preferred Pharmacy (with phone number or street name):  [id number] - [place], [place] - [address] AT [facility] [address] [facility] [address]  [address] [place], [place] [address]  Phone:  [phone]  Fax:  [phone]   Has the patient been seen for an appointment in the last year OR does the patient have an upcoming appointment? Yes.    Agent: Please be advised that RX refills may take up to 3 business days. We ask that you follow-up with your pharmacy.

## 2021-05-16 MED ORDER — AMLODIPINE BESYLATE 10 MG PO TABS: 10.0000 mg | ORAL_TABLET | Freq: Every day | ORAL | 0 refills | Status: DC

## 2021-05-17 ENCOUNTER — Other Ambulatory Visit: Payer: Self-pay | Admitting: Family Medicine

## 2021-05-17 DIAGNOSIS — I10 Essential (primary) hypertension: Secondary | ICD-10-CM

## 2021-05-18 NOTE — Progress Notes (Signed)
Established patient visit   Patient: Cassidy Kelly   DOB: 09-02-1970   51 y.o. Female  MRN: [id number] Visit Date: 05/19/2021  Today's healthcare provider: Gwyneth Sprout, FNP   No chief complaint on file.  Subjective    HPI  Patient is a 51 year old female who presents with behavioral health concerns.  Patient states that she has been dealing with sleep issues since 2017.  She was to have a sleep study over the last summer but was unable to do so due to caring for a sick parent.   Patient complains that her lack of sleep is now causing her to be unable to focus which is impacting her performance at work.  She state that she is falling asleep in the evening at 7:30 8 pm the latest and then wakes up by midnight and is unable to go back to sleep.  She states she is unable to turn her brain off at night.  She said she is ready to try some sort of sleep aid and also needs to get the sleep study rescheduled.  She is concerned that she has developed ADD or ADHFD.  She said she has been using figits more often now as well.  Patient also complains of plantar fasciitis and would like a referral.  Medications: Outpatient Medications Prior to Visit  Medication Sig   amLODipine (NORVASC) 10 MG tablet Take 1 tablet (10 mg total) by mouth daily.   cholecalciferol (VITAMIN D3) 25 MCG (1000 UNIT) tablet Take 1,000 Units by mouth daily.   cyclobenzaprine (FLEXERIL) 10 MG tablet Take 1 tablet (10 mg total) by mouth at bedtime.   meloxicam (MOBIC) 15 MG tablet TAKE 1 TABLET(15 MG) BY MOUTH DAILY   metaxalone (SKELAXIN) 800 MG tablet Take 1 tablet (800 mg total) by mouth 3 (three) times daily as needed for muscle spasms.   Multiple Vitamin (MULTIVITAMIN) tablet Take 1 tablet by mouth daily.   TURMERIC PO Take by mouth.   No facility-administered medications prior to visit.    Review of Systems  Neurological:  Positive for dizziness and headaches. Negative for light-headedness.   Psychiatric/Behavioral:  Positive for agitation, behavioral problems, decreased concentration and sleep disturbance. Negative for confusion, dysphoric mood, hallucinations, self-injury and suicidal ideas. The patient is hyperactive. The patient is not nervous/anxious.       Objective    BP (!) 143/82 (BP Location: Right Arm, Patient Position: Sitting, Cuff Size: Large)    Pulse 76    Temp 97.7 F (36.5 C) (Oral)    Wt 214 lb (97.1 kg)    SpO2 100%    BMI 39.14 kg/m    Physical Exam Vitals and nursing note reviewed.  Constitutional:      General: She is not in acute distress.    Appearance: Normal appearance. She is obese. She is not ill-appearing, toxic-appearing or diaphoretic.  HENT:     Head: Normocephalic and atraumatic.  Cardiovascular:     Rate and Rhythm: Normal rate and regular rhythm.     Pulses: Normal pulses.     Heart sounds: Normal heart sounds. No murmur heard.   No friction rub. No gallop.  Pulmonary:     Effort: Pulmonary effort is normal. No respiratory distress.     Breath sounds: Normal breath sounds. No stridor. No wheezing, rhonchi or rales.  Chest:     Chest wall: No tenderness.  Abdominal:     General: Bowel sounds are normal.  Palpations: Abdomen is soft.  Musculoskeletal:        General: No swelling, tenderness, deformity or signs of injury. Normal range of motion.     Right lower leg: No edema.     Left lower leg: No edema.  Skin:    General: Skin is warm and dry.     Capillary Refill: Capillary refill takes less than 2 seconds.     Coloration: Skin is not jaundiced or pale.     Findings: No bruising, erythema, lesion or rash.  Neurological:     General: No focal deficit present.     Mental Status: She is alert and oriented to person, place, and time. Mental status is at baseline.     Cranial Nerves: No cranial nerve deficit.     Sensory: No sensory deficit.     Motor: No weakness.     Coordination: Coordination normal.  Psychiatric:         Mood and Affect: Mood normal.        Behavior: Behavior normal.        Thought Content: Thought content normal.        Judgment: Judgment normal.      No results found for any visits on 05/19/21.  Assessment & Plan     Problem List Items Addressed This Visit       Cardiovascular and Mediastinum   Essential hypertension    Chronic, elevated today Discussed BP link and possible elevation due to stress response and lack of sleep Continue Norvasc Continue to diet and exercise routine as previously practiced Reinforced BP goal <140/<90        Other   Inattention - Primary    Reports making 'large mistakes' on paperwork at work Will start wellbutrin to assist with addition as well as SSRI to address Likely linked to ongoing stress concerns and lack of sleep- sleeping 4 hours a night only      Insomnia    Was unable to obtain sleep study d/t challenges in health of family; pt is not concerned over apnea, as this was mentioned x1 occurrence by a partner Continue to focus on sleep hygiene Continue Olly OTC- pt currently using Recommend addition of tart cherry juice or balanced snack to prevent drop in blood glucose as pt reports largest meal is at lunch      Pre-diabetes    Hx of pre-diabetes Active at gym 5 days a week Continue to reinforce dietary changes Continue to recommend balanced, lower carb meals. Smaller meal size, adding snacks. Choosing water as drink of choice and increasing purposeful exercise.       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 These medications are generally effective at alleviating symptoms of anxiety and/or depression. Let me know if significant side  effects do occur.  Will Start Prozac 10 mg daily Discussed potential side effects, incl GI upset, sexual dysfunction, increased anxiety, and SI Discussed that it can take 6-8 weeks to reach full efficacy Contracted for safety - no SI/HI Discussed synergistic effects of medications and therapy          Return in about 6 weeks (around 06/30/2021) for anxiety and depression- can be virtual.  Vonna Kotyk, FNP, have reviewed all documentation for this visit. The documentation on 05/19/21 for the exam, diagnosis, procedures, and orders are all accurate and complete.    Gwyneth Sprout, [place] [phone] (phone) [phone] (fax)  [place]

## 2021-05-19 ENCOUNTER — Encounter: Payer: Self-pay | Admitting: Family Medicine

## 2021-05-19 ENCOUNTER — Other Ambulatory Visit: Payer: Self-pay

## 2021-05-19 ENCOUNTER — Ambulatory Visit: Payer: BC Managed Care – PPO | Admitting: Family Medicine

## 2021-05-19 VITALS — BP 143/82 | HR 76 | Temp 97.7°F | Wt 214.0 lb

## 2021-05-19 DIAGNOSIS — F4329 Adjustment disorder with other symptoms: Secondary | ICD-10-CM | POA: Diagnosis not present

## 2021-05-19 DIAGNOSIS — G4700 Insomnia, unspecified: Secondary | ICD-10-CM | POA: Diagnosis not present

## 2021-05-19 DIAGNOSIS — R41840 Attention and concentration deficit: Secondary | ICD-10-CM | POA: Insufficient documentation

## 2021-05-19 DIAGNOSIS — R7303 Prediabetes: Secondary | ICD-10-CM | POA: Diagnosis not present

## 2021-05-19 DIAGNOSIS — E1122 Type 2 diabetes mellitus with diabetic chronic kidney disease: Secondary | ICD-10-CM | POA: Insufficient documentation

## 2021-05-19 DIAGNOSIS — I10 Essential (primary) hypertension: Secondary | ICD-10-CM

## 2021-05-19 MED ORDER — BUPROPION HCL ER (XL) 150 MG PO TB24: ORAL_TABLET | ORAL | 1 refills | Status: DC

## 2021-05-19 MED ORDER — FLUOXETINE HCL 10 MG PO CAPS: 10.0000 mg | ORAL_CAPSULE | Freq: Every day | ORAL | 3 refills | Status: DC

## 2021-05-19 NOTE — Assessment & Plan Note (Signed)
Hx of pre-diabetes Active at gym 5 days a week Continue to reinforce dietary changes Continue to recommend balanced, lower carb meals. Smaller meal size, adding snacks. Choosing water as drink of choice and increasing purposeful exercise.

## 2021-05-19 NOTE — Patient Instructions (Signed)
https://www.sleepfoundation.org/nutrition/tart-cherry-juice

## 2021-05-19 NOTE — Assessment & Plan Note (Signed)
Chronic, elevated today Discussed BP link and possible elevation due to stress response and lack of sleep Continue Norvasc Continue to diet and exercise routine as previously practiced Reinforced BP goal <140/<90

## 2021-05-19 NOTE — Assessment & Plan Note (Signed)
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 These medications are generally effective at alleviating symptoms of anxiety and/or depression. Let me know if significant side effects do occur.  Will Start Prozac 10 mg daily Discussed potential side effects, incl GI upset, sexual dysfunction, increased anxiety, and SI Discussed that it can take 6-8 weeks to reach full efficacy Contracted for safety - no SI/HI Discussed synergistic effects of medications and therapy

## 2021-05-19 NOTE — Assessment & Plan Note (Signed)
Was unable to obtain sleep study d/t challenges in health of family; pt is not concerned over apnea, as this was mentioned x1 occurrence by a partner Continue to focus on sleep hygiene Continue Olly OTC- pt currently using Recommend addition of tart cherry juice or balanced snack to prevent drop in blood glucose as pt reports largest meal is at lunch

## 2021-05-19 NOTE — Assessment & Plan Note (Signed)
Reports making 'large mistakes' on paperwork at work Will start wellbutrin to assist with addition as well as SSRI to address Likely linked to ongoing stress concerns and lack of sleep- sleeping 4 hours a night only

## 2021-05-27 ENCOUNTER — Encounter: Payer: Self-pay | Admitting: Family Medicine

## 2021-05-27 MED ORDER — BUSPIRONE HCL 7.5 MG PO TABS
7.5000 mg | ORAL_TABLET | Freq: Two times a day (BID) | ORAL | 0 refills | Status: DC
Start: 2021-05-27 — End: 2021-08-23

## 2021-06-09 ENCOUNTER — Telehealth: Payer: Self-pay

## 2021-06-09 NOTE — Telephone Encounter (Signed)
Copied from [place] [id number]. Topic: General - Call Back - No Documentation >> Jun 09, 2021 11:29 AM Cassidy Kelly wrote: Pt states she was to let [name] know when her sleep study will take place.  It is March 30, 8:45 pm

## 2021-06-26 ENCOUNTER — Other Ambulatory Visit: Payer: Self-pay | Admitting: Family Medicine

## 2021-06-27 ENCOUNTER — Ambulatory Visit
Admission: EM | Admit: 2021-06-27 | Discharge: 2021-06-27 | Disposition: A | Discharge status: Home / Self Care | Payer: BC Managed Care – PPO

## 2021-06-27 ENCOUNTER — Encounter: Payer: Self-pay | Admitting: Emergency Medicine

## 2021-06-27 DIAGNOSIS — J302 Other seasonal allergic rhinitis: Secondary | ICD-10-CM

## 2021-06-27 NOTE — ED Provider Notes (Signed)
?[facility] ? ? ? ?CSN: [id number] ?Arrival date & time: 06/27/21  1252 ? ? ?  ? ?History   ?Chief Complaint ?Chief Complaint  ?Patient presents with  ? Sinus Pressure  ? ? ?HPI ?Cassidy Kelly is a 51 y.o. female.  Patient presents with 1 day history of sinus congestion, sinus pressure, runny nose, post nasal drip.  Mild occasional nonproductive cough.  No fever, rash, sore throat, shortness of breath, or other symptoms.  Treatment at home with OTC sinus medication.  Her medical history includes seasonal allergies, hypertension, prediabetes, chronic fatigue, morbid obesity. ? ?The history is provided by the patient and medical records.  ? ?Past Medical History:  ?Diagnosis Date  ? Allergy   ? 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 Hypercholesterolemia   ? Hypertension   ? Obesity (BMI 30-39.9)   ? ? ?Patient Active Problem List  ? Diagnosis Date Noted  ? Inattention 05/19/2021  ? Pre-diabetes 05/19/2021  ? Stress and adjustment reaction 05/19/2021  ? Acute pain of left shoulder 02/25/2021  ? Trigger finger of left thumb 02/25/2021  ? Morbid obesity (HCC) 09/24/2020  ? Chronic fatigue 09/24/2020  ? Insomnia 09/24/2020  ? Hypercholesterolemia 08/08/2017  ? Essential hypertension 08/08/2017  ? Vertigo 07/17/2017  ? ? ?Past Surgical History:  ?Procedure Laterality Date  ? COLONOSCOPY WITH PROPOFOL N/A 09/30/2018  ? Procedure: COLONOSCOPY WITH PROPOFOL;  Surgeon: Wyline Mood, MD;  Location: [facility] ENDOSCOPY;  Service: Gastroenterology;  Laterality: N/A;  ? WISDOM TOOTH EXTRACTION    ? AGE 82; ALL FOUR  ? ? ?OB History   ? ? Gravida  ?2  ? Para  ?1  ? Term  ?1  ? Preterm  ?   ? AB  ?1  ? Living  ?1  ?  ? ? SAB  ?1  ? IAB  ?   ? Ectopic  ?   ? Multiple  ?   ? Live Births  ?1  ?   ?  ?  ? ? ? ?Home Medications   ? ?Prior to Admission medications   ?Medication Sig Start Date End Date Taking? Authorizing Provider  ?amLODipine (NORVASC) 10 MG tablet Take 1 tablet (10 mg total) by mouth daily. 05/16/21   Erasmo Downer, MD   ?buPROPion (WELLBUTRIN XL) 150 MG 24 hr tablet Take 150 mg PO for 3 weeks, start 2-150 mg (300mg ) PO following. 05/19/21   05/21/21, FNP  ?busPIRone (BUSPAR) 7.5 MG tablet Take 1 tablet (7.5 mg total) by mouth 2 (two) times daily. 05/27/21   07/27/21, FNP  ?cholecalciferol (VITAMIN D3) 25 MCG (1000 UNIT) tablet Take 1,000 Units by mouth daily.    [provider]  ?cyclobenzaprine (FLEXERIL) 10 MG tablet Take 1 tablet (10 mg total) by mouth at bedtime. 02/25/21   14/2/22, FNP  ?FLUoxetine (PROZAC) 10 MG capsule Take 1 capsule (10 mg total) by mouth daily. 05/19/21   05/21/21, FNP  ?meloxicam (MOBIC) 15 MG tablet TAKE 1 TABLET(15 MG) BY MOUTH DAILY 03/24/21   03/26/21, FNP  ?metaxalone (SKELAXIN) 800 MG tablet Take 1 tablet (800 mg total) by mouth 3 (three) times daily as needed for muscle spasms. 02/25/21   14/2/22, FNP  ?Multiple Vitamin (MULTIVITAMIN) tablet Take 1 tablet by mouth daily.    [provider]  ?TURMERIC PO Take by mouth.    [provider]  ? ? ?Family History ?Family History  ?Problem Relation Age  of Onset  ? Healthy Mother   ? Diabetes Maternal Grandmother   ? Heart disease Paternal Grandfather   ?     MIs in 5750s  ? ? ?Social History ?Social History  ? ?Tobacco Use  ? Smoking status: Never  ? Smokeless tobacco: Never  ?Vaping Use  ? Vaping Use: Never used  ?Substance Use Topics  ? Alcohol use: Yes  ?  Comment: OCC  ? Drug use: No  ? ? ? ?Allergies   ?Other and Pollen extract ? ? ?Review of Systems ?Review of Systems  ?Constitutional:  Negative for chills and fever.  ?HENT:  Positive for congestion, postnasal drip, rhinorrhea and sinus pressure. Negative for ear pain and sore throat.   ?Respiratory:  Positive for cough. Negative for shortness of breath.   ?Cardiovascular:  Negative for chest pain and palpitations.  ?Gastrointestinal:  Negative for diarrhea and vomiting.  ?Skin:  Negative for color change and rash.  ?All other systems  reviewed and are negative. ? ? ?Physical Exam ?Triage Vital Signs ?ED Triage Vitals  ?Enc Vitals Group  ?   BP   ?   Pulse   ?   Resp   ?   Temp   ?   Temp src   ?   SpO2   ?   Weight   ?   Height   ?   Head Circumference   ?   Peak Flow   ?   Pain Score   ?   Pain Loc   ?   Pain Edu?   ?   Excl. in GC?   ? ?No data found. ? ?Updated Vital Signs ?BP 114/78   Pulse 72   Temp 98.2 ?F (36.8 ?C)   Resp 18   LMP 08/01/2017   SpO2 98%  ? ?Visual Acuity ?Right Eye Distance:   ?Left Eye Distance:   ?Bilateral Distance:   ? ?Right Eye Near:   ?Left Eye Near:    ?Bilateral Near:    ? ?Physical Exam ?Vitals and nursing note reviewed.  ?Constitutional:   ?   General: She is not in acute distress. ?   Appearance: She is well-developed. She is obese. She is not ill-appearing.  ?HENT:  ?   Right Ear: Tympanic membrane normal.  ?   Left Ear: Tympanic membrane normal.  ?   Nose: Congestion and rhinorrhea present.  ?   Mouth/Throat:  ?   Mouth: Mucous membranes are moist.  ?   Pharynx: Oropharynx is clear.  ?Cardiovascular:  ?   Rate and Rhythm: Normal rate and regular rhythm.  ?   Heart sounds: Normal heart sounds.  ?Pulmonary:  ?   Effort: Pulmonary effort is normal. No respiratory distress.  ?   Breath sounds: Normal breath sounds.  ?Musculoskeletal:  ?   Cervical back: Neck supple.  ?Skin: ?   General: Skin is warm and dry.  ?Neurological:  ?   Mental Status: She is alert.  ?Psychiatric:     ?   Mood and Affect: Mood normal.     ?   Behavior: Behavior normal.  ? ? ? ?UC Treatments / Results  ?Labs ?(all labs ordered are listed, but only abnormal results are displayed) ?Labs Reviewed - No data to display ? ?EKG ? ? ?Radiology ?No results found. ? ?Procedures ?Procedures (including critical care time) ? ?Medications Ordered in UC ?Medications - No data to display ? ?Initial Impression / Assessment and Plan / UC Course  ?  I have reviewed the triage vital signs and the nursing notes. ? ?Pertinent labs & imaging results that were  available during my care of the patient were reviewed by me and considered in my medical decision making (see chart for details). ? ?Seasonal allergies.  Discussed symptomatic treatment including OTC Flonase and Zyrtec.  Education provided on allergic rhinitis.  Instructed patient to follow-up with her PCP if her symptoms are not improving.  She agrees to plan of care. ? ? ?Final Clinical Impressions(s) / UC Diagnoses  ? ?Final diagnoses:  ?Seasonal allergies  ? ? ? ?Discharge Instructions   ? ?  ?Use Flonase nasal spray and take Zyrtec as directed.  Follow up with your primary care provider if your symptoms are not improving.   ? ? ? ? ? ?ED Prescriptions   ?None ?  ? ?PDMP not reviewed this encounter. ?  ?Mickie Bail, NP ?06/27/21 1354 ? ?

## 2021-06-27 NOTE — ED Triage Notes (Signed)
Pt states she has congestion for a couple of days and sinus pressure/pain since yesterday.  ?

## 2021-06-27 NOTE — Discharge Instructions (Addendum)
Use Flonase nasal spray and take Zyrtec as directed.  Follow up with your primary care provider if your symptoms are not improving.    

## 2021-06-28 NOTE — Telephone Encounter (Signed)
Requested Prescriptions  ?Pending Prescriptions Disp Refills  ?? buPROPion (WELLBUTRIN XL) 150 MG 24 hr tablet [Pharmacy Med Name: BUPROPION XL 150MG  TABLETS (24 H)] 60 tablet 2  ?  Sig: TAKE 1 TABLET BY MOUTH DAILY FOR 3 WEEKS, THEN 2 TABLETS(300MG ) THEREAFTER  ?  ? Psychiatry: Antidepressants - bupropion Failed - 06/26/2021  8:00 AM  ?  ?  Failed - ALT in normal range and within 360 days  ?  ALT  ?Date Value Ref Range Status  ?04/29/2021 42 (H) 0 - 32 IU/L Final  ?   ?  ?  Failed - Last BP in normal range  ?  BP Readings from Last 1 Encounters:  ?06/27/21 114/78  ?   ?  ?  Passed - Cr in normal range and within 360 days  ?  Creat  ?Date Value Ref Range Status  ?10/11/2015 0.94 0.50 - 1.10 mg/dL Final  ? ?Creatinine, Ser  ?Date Value Ref Range Status  ?04/29/2021 0.83 0.57 - 1.00 mg/dL Final  ?   ?  ?  Passed - AST in normal range and within 360 days  ?  AST  ?Date Value Ref Range Status  ?04/29/2021 28 0 - 40 IU/L Final  ?   ?  ?  Passed - Valid encounter within last 6 months  ?  Recent Outpatient Visits   ?      ? 1 month ago Inattention  ? [facility], FNP  ? 2 months ago Chronic fatigue  ? [facility], Erin E, PA-C  ? 4 months ago Acute pain of left shoulder  ? [facility], FNP  ? 9 months ago Essential hypertension  ? [facility] [place], [place], MD  ? 1 year ago Chronic fatigue  ? [facility] [place], [place], [place]  ?  ?  ?Future Appointments   ?        ? In 3 weeks [place], FNP [facility]  ?  ? ?  ?  ?  ? ? ?

## 2021-07-18 NOTE — Progress Notes (Signed)
?  ?[employer] as a Education administrator for Gwyneth Sprout, FNP.,have documented all relevant documentation on the behalf of Gwyneth Sprout, FNP,as directed by  Gwyneth Sprout, FNP while in the presence of Gwyneth Sprout, FNP.  ? ?Established patient visit ? ? ?Patient: Cassidy Kelly   DOB: [birth year]   51 y.o. Female  MRN: [id number] ?Visit Date: 07/19/2021 ? ?Today's healthcare provider: Gwyneth Sprout, FNP  ?Re Introduced to nurse practitioner role and practice setting.  All questions answered.  Discussed provider/patient relationship and expectations. ? ? ?Chief Complaint  ?Patient presents with  ? Anxiety  ? Dizziness  ?  Patient reports concerns of dizziness or vertigo when she laughs or if she is straining herself when using the restroom. Patient states that dizziness has been occurring intermittent for 2 weeks or more.   ? Hand Pain  ?  Patient reports that she developed trigger finger in her left thumb December of 2022 and states that she has concerns because there has been no improvement.   ? skin changes  ?  Patient reports concerns of skin changes to the right side of her neck. Patient reports concern of a possible knot or bump that has formed on the right side and states that it is hard to the touch.   ? ?Subjective  ?  ?HPI ?HPI   ? ? Dizziness   ? Additional comments: Patient reports concerns of dizziness or vertigo when she laughs or if she is straining herself when using the restroom. Patient states that dizziness has been occurring intermittent for 2 weeks or more.  ? ?  ?  ? ? Hand Pain   ? Additional comments: Patient reports that she developed trigger finger in her left thumb December of 2022 and states that she has concerns because there has been no improvement.  ? ?  ?  ? ? skin changes   ? Additional comments: Patient reports concerns of skin changes to the right side of her neck. Patient reports concern of a possible knot or bump that has formed on the right side and states that it is  hard to the touch.  ? ?  ?  ?Last edited by Minette Headland, CMA on 07/19/2021  3:53 PM.  ?  ?  ?Anxiety, Follow-up ? ?She was last seen for anxiety 6 weeks ago. ?Changes made at last visit include Start Prozac 10 mg daily. ?  ?She reports fair compliance with treatment. ?She reports excellent tolerance of treatment. ?She is not having side effects.  ? ?She feels her anxiety is moderate and Unchanged since last visit. ? ?Symptoms: ?No chest pain Yes difficulty concentrating  ?Yes dizziness Yes fatigue  ?No feelings of losing control Yes insomnia  ?No irritable No palpitations  ?No panic attacks Yes racing thoughts  ?No shortness of breath No sweating  ?No tremors/shakes   ? ?GAD-7 Results ? ?  07/19/2021  ?  4:17 PM  ?GAD-7 Generalized Anxiety Disorder Screening Tool  ?1. Feeling Nervous, Anxious, or on Edge 1  ?2. Not Being Able to Stop or Control Worrying 1  ?3. Worrying Too Much About Different Things 2  ?4. Trouble Relaxing 3  ?5. Being So Restless it's Hard To Sit Still 3  ?6. Becoming Easily Annoyed or Irritable 1  ?7. Feeling Afraid As If Something Awful Might Happen 1  ?Total GAD-7 Score 12  ?Difficulty At Work, Home, or Getting  Along With Others? Somewhat difficult  ? ? ?  PHQ-9 Scores ? ?  07/19/2021  ?  4:03 PM 04/28/2021  ?  3:30 PM 09/24/2020  ?  9:16 AM  ?PHQ9 SCORE ONLY  ?PHQ-9 Total Score _0 ? ? ?---------------------------------------------------------------------------------------------------  ? ?Medications: ?Outpatient Medications Prior to Visit  ?Medication Sig  ? amLODipine (NORVASC) 10 MG tablet Take 1 tablet (10 mg total) by mouth daily.  ? buPROPion (WELLBUTRIN XL) 150 MG 24 hr tablet TAKE 1 TABLET BY MOUTH DAILY FOR 3 WEEKS, THEN 2 TABLETS(300MG) THEREAFTER  ? busPIRone (BUSPAR) 7.5 MG tablet Take 1 tablet (7.5 mg total) by mouth 2 (two) times daily.  ? cholecalciferol (VITAMIN D3) 25 MCG (1000 UNIT) tablet Take 1,000 Units by mouth daily.  ? FLUoxetine (PROZAC) 10 MG capsule Take 1  capsule (10 mg total) by mouth daily.  ? meloxicam (MOBIC) 15 MG tablet TAKE 1 TABLET(15 MG) BY MOUTH DAILY  ? metaxalone (SKELAXIN) 800 MG tablet Take 1 tablet (800 mg total) by mouth 3 (three) times daily as needed for muscle spasms.  ? Multiple Vitamin (MULTIVITAMIN) tablet Take 1 tablet by mouth daily.  ? TURMERIC PO Take by mouth.  ? [DISCONTINUED] cyclobenzaprine (FLEXERIL) 10 MG tablet Take 1 tablet (10 mg total) by mouth at bedtime. (Patient not taking: Reported on 07/19/2021)  ? ?No facility-administered medications prior to visit.  ? ? ?Review of Systems ? ?Last CBC ?Lab Results  ?Component Value Date  ? WBC 7.9 04/29/2021  ? HGB 12.0 04/29/2021  ? HCT 35.1 04/29/2021  ? MCV 88 04/29/2021  ? MCH 30.2 04/29/2021  ? RDW 13.2 04/29/2021  ? PLT 241 04/29/2021  ? ?Last metabolic panel ?Lab Results  ?Component Value Date  ? GLUCOSE 117 (H) 04/29/2021  ? NA 138 04/29/2021  ? K 4.1 04/29/2021  ? CL 102 04/29/2021  ? CO2 23 04/29/2021  ? BUN 12 04/29/2021  ? CREATININE 0.83 04/29/2021  ? EGFR 85 04/29/2021  ? CALCIUM 9.5 04/29/2021  ? PROT 7.0 04/29/2021  ? ALBUMIN 4.6 04/29/2021  ? LABGLOB 2.4 04/29/2021  ? AGRATIO 1.9 04/29/2021  ? BILITOT 0.3 04/29/2021  ? ALKPHOS 134 (H) 04/29/2021  ? AST 28 04/29/2021  ? ALT 42 (H) 04/29/2021  ? ANIONGAP 10 06/14/2017  ? ?Last lipids ?Lab Results  ?Component Value Date  ? CHOL 261 (H) 04/29/2021  ? HDL 73 04/29/2021  ? LDLCALC 174 (H) 04/29/2021  ? TRIG 83 04/29/2021  ? CHOLHDL 3.6 04/29/2021  ? ?Last hemoglobin A1c ?Lab Results  ?Component Value Date  ? HGBA1C 6.2 (H) 04/29/2021  ? ?Last thyroid functions ?Lab Results  ?Component Value Date  ? TSH 2.590 04/29/2021  ? ?Last vitamin D ?Lab Results  ?Component Value Date  ? VD25OH 32.3 04/29/2021  ? ?Last vitamin B12 and Folate ?Lab Results  ?Component Value Date  ? OILNZVJK82 516 04/29/2021  ? FOLATE >20.0 04/29/2021  ? ?  ?  Objective  ?  ?BP (!) 143/82   Pulse 78   Temp 98.8 ?F (37.1 ?C) (Oral)   Resp 16   Wt 214 lb 9.6 oz  (97.3 kg)   LMP 08/01/2017   BMI 39.25 kg/m?  ?BP Readings from Last 3 Encounters:  ?07/19/21 (!) 143/82  ?06/27/21 114/78  ?05/19/21 (!) 143/82  ? ?Wt Readings from Last 3 Encounters:  ?07/19/21 214 lb 9.6 oz (97.3 kg)  ?05/19/21 214 lb (97.1 kg)  ?04/28/21 218 lb 8 oz (99.1 kg)  ? ?SpO2 Readings from Last 3 Encounters:  ?06/27/21 98%  ?  05/19/21 100%  ?04/28/21 100%  ? ? ?  07/19/2021  ?  4:17 PM  ?GAD 7 : Generalized Anxiety Score  ?Nervous, Anxious, on Edge 1  ?Control/stop worrying 1  ?Worry too much - different things 2  ?Trouble relaxing 3  ?Restless 3  ?Easily annoyed or irritable 1  ?Afraid - awful might happen 1  ?Total GAD 7 Score 12  ?Anxiety Difficulty Somewhat difficult  ? ? ? ?  07/19/2021  ?  4:03 PM 04/28/2021  ?  3:30 PM 09/24/2020  ?  9:16 AM 07/24/2019  ?  9:16 AM 02/19/2018  ?  4:15 PM  ?Depression screen PHQ 2/9  ?Decreased Interest 1 0 0 0 1  ?Down, Depressed, Hopeless 1 0 0 0 3  ?PHQ - 2 Score 2 0 0 0 4  ?Altered sleeping 3 3 0 0 3  ?Tired, decreased energy _0 0 3  ?Change in appetite 2 1 0 0   ?Feeling bad or failure about yourself  2  0 0 3  ?Trouble concentrating 0 0 0 1 3  ?Moving slowly or fidgety/restless 0 2 0 0 1  ?Suicidal thoughts 0 0 0 0 0  ?PHQ-9 Score _1 ?Difficult doing work/chores Somewhat difficult Not difficult at all Not difficult at all Not difficult at all Very difficult  ? ? ? ?Physical Exam ?Vitals and nursing note reviewed.  ?Constitutional:   ?   General: She is not in acute distress. ?   Appearance: Normal appearance. She is obese. She is not ill-appearing, toxic-appearing or diaphoretic.  ?HENT:  ?   Head: Normocephalic and atraumatic.  ?Neck:  ? ?Cardiovascular:  ?   Rate and Rhythm: Normal rate and regular rhythm.  ?   Pulses: Normal pulses.  ?   Heart sounds: Normal heart sounds. No murmur heard. ?  No friction rub. No gallop.  ?Pulmonary:  ?   Effort: Pulmonary effort is normal. No respiratory distress.  ?   Breath sounds: Normal breath sounds. No stridor.  No wheezing, rhonchi or rales.  ?Chest:  ?   Chest wall: No tenderness.  ?Abdominal:  ?   General: Bowel sounds are normal.  ?   Palpations: Abdomen is soft.  ?Musculoskeletal:     ?   General: Tenderness present

## 2021-07-19 ENCOUNTER — Encounter: Payer: Self-pay | Admitting: Family Medicine

## 2021-07-19 ENCOUNTER — Ambulatory Visit: Payer: BC Managed Care – PPO | Admitting: Family Medicine

## 2021-07-19 VITALS — BP 143/82 | HR 78 | Temp 98.8°F | Resp 16 | Wt 214.6 lb

## 2021-07-19 DIAGNOSIS — T753XXA Motion sickness, initial encounter: Secondary | ICD-10-CM | POA: Insufficient documentation

## 2021-07-19 DIAGNOSIS — R599 Enlarged lymph nodes, unspecified: Secondary | ICD-10-CM | POA: Diagnosis not present

## 2021-07-19 DIAGNOSIS — F339 Major depressive disorder, recurrent, unspecified: Secondary | ICD-10-CM

## 2021-07-19 DIAGNOSIS — I10 Essential (primary) hypertension: Secondary | ICD-10-CM | POA: Diagnosis not present

## 2021-07-19 DIAGNOSIS — M79671 Pain in right foot: Secondary | ICD-10-CM

## 2021-07-19 DIAGNOSIS — M79672 Pain in left foot: Secondary | ICD-10-CM | POA: Insufficient documentation

## 2021-07-19 DIAGNOSIS — T753XXD Motion sickness, subsequent encounter: Secondary | ICD-10-CM

## 2021-07-19 DIAGNOSIS — F4329 Adjustment disorder with other symptoms: Secondary | ICD-10-CM | POA: Diagnosis not present

## 2021-07-19 DIAGNOSIS — M65312 Trigger thumb, left thumb: Secondary | ICD-10-CM

## 2021-07-19 MED ORDER — SCOPOLAMINE 1 MG/3DAYS TD PT72: 1.0000 | MEDICATED_PATCH | TRANSDERMAL | 12 refills | Status: AC

## 2021-07-19 NOTE — Assessment & Plan Note (Signed)
Chronic, worsening ?Referral to CCM ?Encourage daily use of wellbutrin and prozac/ 300 and 10 mg ?Denies feelings of hopelessness ?Contracted for safety; denies SI or HI ?

## 2021-07-19 NOTE — Assessment & Plan Note (Signed)
Chronic, borderline; however, pt with active anxiety in office ?Denies CP ?Denies SOB/ DOE ?Denies low blood pressure/hypotension ?Denies vision changes ?No LE Edema noted on exam ?Continue medication, Norvasc 10 mg ?RTC 1 month ?Seek emergent care if you develop chest pain or chest pressure ? ?

## 2021-07-19 NOTE — Assessment & Plan Note (Signed)
Chronic, recurrent ?With hx of cruise ?

## 2021-07-19 NOTE — Assessment & Plan Note (Signed)
Chronic, worsening ?Referral to ortho ?Previously treated with mobic and skelaxin for shoulder complaints ? ?

## 2021-07-19 NOTE — Assessment & Plan Note (Signed)
Acute complaint, present for 3 months ?Hx of morbid obesity ?Will refer to ortho given upcoming cruise  ?

## 2021-07-19 NOTE — Assessment & Plan Note (Signed)
Post occipital gland enlargement; complaints of allergies ?Denies systemic complaints ?Will order [place] given size and presence since first noted 3/15 ?CBC to check for infection  ?

## 2021-07-19 NOTE — Assessment & Plan Note (Signed)
Chronic, worsening ?Does not wish to change prozac dosing at this time; will work on consistency of use- reported missing 1-2 tablets/wk ? ?

## 2021-07-20 ENCOUNTER — Telehealth: Payer: Self-pay | Admitting: *Deleted

## 2021-07-20 LAB — CBC WITH DIFFERENTIAL/PLATELET
Basophils Absolute: 0 10*3/uL (ref 0.0–0.2)
Basos: 0 %
EOS (ABSOLUTE): 0.1 10*3/uL (ref 0.0–0.4)
Eos: 1 %
Hematocrit: 35.9 % (ref 34.0–46.6)
Hemoglobin: 12.2 g/dL (ref 11.1–15.9)
Immature Grans (Abs): 0 10*3/uL (ref 0.0–0.1)
Immature Granulocytes: 0 %
Lymphocytes Absolute: 3.8 10*3/uL — ABNORMAL HIGH (ref 0.7–3.1)
Lymphs: 39 %
MCH: 29.8 pg (ref 26.6–33.0)
MCHC: 34 g/dL (ref 31.5–35.7)
MCV: 88 fL (ref 79–97)
Monocytes Absolute: 0.6 10*3/uL (ref 0.1–0.9)
Monocytes: 7 %
Neutrophils Absolute: 5.2 10*3/uL (ref 1.4–7.0)
Neutrophils: 53 %
Platelets: 235 10*3/uL (ref 150–450)
RBC: 4.1 x10E6/uL (ref 3.77–5.28)
RDW: 13.2 % (ref 11.7–15.4)
WBC: 9.8 10*3/uL (ref 3.4–10.8)

## 2021-07-20 NOTE — Chronic Care Management (AMB) (Signed)
?  Care Management  ? ?Note ? ?07/20/2021 ?Name: Cassidy Kelly MRN: [id number] DOB: [birth year] ? ?Cassidy Kelly is a 51 y.o. year old female who is a primary care patient of Gwyneth Sprout, FNP. I reached out to Duanne Limerick by phone today offer care coordination services.  ? ?Ms. Placide was given information about care management services today including:  ?Care management services include personalized support from designated clinical staff supervised by her physician, including individualized plan of care and coordination with other care providers ?24/7 contact phone numbers for assistance for urgent and routine care needs. ?The patient may stop care management services at any time by phone call to the office staff. ? ?Patient agreed to services and verbal consent obtained.  ? ?Follow up plan: ?Telephone appointment with care management team member scheduled for: 08/04/2021 ? ?Jaina Morin, CCMA ?Care Guide, Embedded Care Coordination ?[facility]  ?Direct Dial: [phone] ? ? ?

## 2021-08-02 ENCOUNTER — Ambulatory Visit
Admission: RE | Admit: 2021-08-02 | Discharge: 2021-08-02 | Disposition: A | Discharge status: Home / Self Care | Payer: BC Managed Care – PPO | Source: Ambulatory Visit | Attending: Family Medicine | Admitting: Family Medicine

## 2021-08-02 DIAGNOSIS — R599 Enlarged lymph nodes, unspecified: Secondary | ICD-10-CM | POA: Insufficient documentation

## 2021-08-04 ENCOUNTER — Ambulatory Visit: Payer: BC Managed Care – PPO | Admitting: *Deleted

## 2021-08-04 DIAGNOSIS — F339 Major depressive disorder, recurrent, unspecified: Secondary | ICD-10-CM

## 2021-08-04 DIAGNOSIS — I10 Essential (primary) hypertension: Secondary | ICD-10-CM

## 2021-08-04 DIAGNOSIS — F4329 Adjustment disorder with other symptoms: Secondary | ICD-10-CM

## 2021-08-04 NOTE — Patient Instructions (Addendum)
Visit Information ? ?Thank you for taking time to visit with me today. Please don't hesitate to contact me if I can be of assistance to you before our next scheduled telephone appointment. ? ?Following are the goals we discussed today:  ?- begin personal counseling ?- start or continue a personal journal ?- talk about feelings with a friend, family or spiritual advisor ?- practice positive thinking and self-talk ? ?Our next appointment is by telephone on 08/18/21 at 10:30am ? ?Please call the care guide team at [phone] if you need to cancel or reschedule your appointment.  ? ?If you are experiencing a Mental Health or Behavioral Health Crisis or need someone to talk to, please call the Suicide and Crisis Lifeline: 988  ? ?Following is a copy of your full plan of care:  ?Care Plan : General Social Work (Adult)  ?Updates made by Wenda Overland, LCSW since 08/05/2021 12:00 AM  ?  ? ?Problem: CHL AMB "PATIENT-SPECIFIC PROBLEM"   ?Note:   ?CARE PLAN ENTRY ?(see longitudinal plan of care for additional care plan information) ? ?Current Barriers:  ?Knowledge deficits related to accessing mental health provider in patient with Depression  ?Patient is experiencing symptoms of  depression which seem to be exacerbated by recent separation from spouse.     ?Patient needs Support, Education, and Care Coordination in order to meet unmet mental health needs  ?Mental Health Concerns  ? ?Clinical Social Work Goal(s):  ?Over the next 90 days, patient will work with SW bi-weekly by telephone or in person to reduce or manage symptoms of depression until connected for ongoing counseling resources.  ?Patient will implement clinical interventions discussed today to decreases symptoms of depression and increase knowledge and/or ability of: self-management skills. ? ? ?Interventions:  ?Assessed patient's understanding, education, previous treatment and care coordination needs  ?Patient interviewed and appropriate assessments performed:  PHQ 2 ?PHQ 9 ?Provided basic mental health support, education and interventions related to depressive symptoms expressed ?Discussion of positive coping strategies initiciated ?Collaborated with appropriate clinical care team members regarding patient needs ?Discussed  options for long term counseling based on need and insurance, patient reluctant but will continue to consider ?Reviewed mental health medications with patient prescribed by PCP and discussed compliance  ?Other interventions include: Motivational Interviewing employed ?Active listening / Reflection utilized  ?Emotional Support Provided  ? ?Patient Self Care Activities & Deficits:  ?Patient is unable to independently navigate community resource options without care coordination support ?Patient is able to implement clinical interventions discussed today and is motivated for treatment  ?Patient will select one of the agencies from the list provided and call to schedule an appointment ?Attends all scheduled provider appointments ?Performs ADL's independently ?Performs IADL's independently ? ?Initial goal documentation ? ?  ? ? ?Cassidy Kelly was given information about Care Management services by the embedded care coordination team including:  ?Care Management services include personalized support from designated clinical staff supervised by her physician, including individualized plan of care and coordination with other care providers ?24/7 contact phone numbers for assistance for urgent and routine care needs. ?The patient may stop CCM services at any time (effective at the end of the month) by phone call to the office staff. ? ?Patient agreed to services and verbal consent obtained.  ? ?Patient verbalizes understanding of instructions and care plan provided today and agrees to view in MyChart. Active MyChart status confirmed with patient.   ? ?Telephone follow up appointment with care management team member scheduled for: 08/18/21 ? ? ?  Tanysha Quant,  LCSW ?Clinical Social Worker  ?[facility] ?[phone] ? ? ?  ?

## 2021-08-05 ENCOUNTER — Other Ambulatory Visit: Payer: Self-pay

## 2021-08-05 NOTE — Chronic Care Management (AMB) (Signed)
Care Management ?Clinical Social Work Note ? ?08/05/2021 ?Name: Cassidy Kelly MRN: [id number] DOB: February 10, 1971 ? ?Cassidy Kelly is a 51 y.o. year old female who is a primary care patient of Cassidy Kindle, FNP.  The Care Management team was consulted for assistance with chronic disease management and coordination needs. ? ?Engaged with patient by telephone for follow up visit in response to provider referral for social work chronic care management and care coordination services ? ?Consent to Services:  ?Cassidy Kelly was given information about Care Management services today including:  ?Care Management services includes personalized support from designated clinical staff supervised by her physician, including individualized plan of care and coordination with other care providers ?24/7 contact phone numbers for assistance for urgent and routine care needs. ?The patient may stop case management services at any time by phone call to the office staff. ? ?Patient agreed to services and consent obtained.  ? ?Assessment: Review of patient past medical history, allergies, medications, and health status, including review of relevant consultants reports was performed today as part of a comprehensive evaluation and provision of chronic care management and care coordination services. ? ?SDOH (Social Determinants of Health) assessments and interventions performed:   ? ?Advanced Directives Status: Not addressed in this encounter. ? ?Care Plan ? ?Allergies  ?Allergen Reactions  ? Other   ?  Cats  ? Pollen Extract   ? ? ?Outpatient Encounter Medications as of 08/04/2021  ?Medication Sig  ? amLODipine (NORVASC) 10 MG tablet Take 1 tablet (10 mg total) by mouth daily.  ? buPROPion (WELLBUTRIN XL) 150 MG 24 hr tablet TAKE 1 TABLET BY MOUTH DAILY FOR 3 WEEKS, THEN 2 TABLETS(300MG ) THEREAFTER  ? busPIRone (BUSPAR) 7.5 MG tablet Take 1 tablet (7.5 mg total) by mouth 2 (two) times daily.  ? cholecalciferol (VITAMIN D3) 25 MCG (1000  UNIT) tablet Take 1,000 Units by mouth daily.  ? FLUoxetine (PROZAC) 10 MG capsule Take 1 capsule (10 mg total) by mouth daily.  ? meloxicam (MOBIC) 15 MG tablet TAKE 1 TABLET(15 MG) BY MOUTH DAILY  ? metaxalone (SKELAXIN) 800 MG tablet Take 1 tablet (800 mg total) by mouth 3 (three) times daily as needed for muscle spasms.  ? Multiple Vitamin (MULTIVITAMIN) tablet Take 1 tablet by mouth daily.  ? scopolamine (TRANSDERM-SCOP) 1 MG/3DAYS Place 1 patch (1.5 mg total) onto the skin every 3 (three) days.  ? TURMERIC PO Take by mouth.  ? ?No facility-administered encounter medications on file as of 08/04/2021.  ? ? ?Patient Active Problem List  ? Diagnosis Date Noted  ? Depression, recurrent (HCC) 07/19/2021  ? Motion sickness 07/19/2021  ? Trigger thumb of left hand 07/19/2021  ? Heel pain, bilateral 07/19/2021  ? Glands swollen 07/19/2021  ? Inattention 05/19/2021  ? Pre-diabetes 05/19/2021  ? Stress and adjustment reaction 05/19/2021  ? Acute pain of left shoulder 02/25/2021  ? Morbid obesity (HCC) 09/24/2020  ? Chronic fatigue 09/24/2020  ? Insomnia 09/24/2020  ? Hypercholesterolemia 08/08/2017  ? Essential hypertension 08/08/2017  ? Vertigo 07/17/2017  ? ? ?Conditions to be addressed/monitored: Depression; Mental Health Concerns  ? ?Care Plan : General Social Work (Adult)  ?Updates made by Cassidy Overland, LCSW since 08/05/2021 12:00 AM  ?  ? ?Problem: CHL AMB "PATIENT-SPECIFIC PROBLEM"   ?Note:   ?CARE PLAN ENTRY ?(see longitudinal plan of care for additional care plan information) ? ?Current Barriers:  ?Knowledge deficits related to accessing mental health provider in patient with Depression  ?Patient is  experiencing symptoms of  depression which seem to be exacerbated by recent separation from spouse.     ?Patient needs Support, Education, and Care Coordination in order to meet unmet mental health needs  ?Mental Health Concerns  ? ?Clinical Social Work Goal(s):  ?Over the next 90 days, patient will work with SW  bi-weekly by telephone or in person to reduce or manage symptoms of depression until connected for ongoing counseling resources.  ?Patient will implement clinical interventions discussed today to decreases symptoms of depression and increase knowledge and/or ability of: self-management skills. ? ? ?Interventions:  ?Assessed patient's understanding, education, previous treatment and care coordination needs  ?Patient interviewed and appropriate assessments performed: PHQ 2 ?PHQ 9 ?Provided basic mental health support, education and interventions related to depressive symptoms expressed ?Discussion of positive coping strategies initiciated ?Collaborated with appropriate clinical care team members regarding patient needs ?Discussed  options for long term counseling based on need and insurance, patient reluctant but will continue to consider ?Reviewed mental health medications with patient prescribed by PCP and discussed compliance  ?Other interventions include: Motivational Interviewing employed ?Active listening / Reflection utilized  ?Emotional Support Provided  ? ?Patient Self Care Activities & Deficits:  ?Patient is unable to independently navigate community resource options without care coordination support ?Patient is able to implement clinical interventions discussed today and is motivated for treatment  ?Patient will select one of the agencies from the list provided and call to schedule an appointment ?Attends all scheduled provider appointments ?Performs ADL's independently ?Performs IADL's independently ? ?Initial goal documentation ? ?  ?  ? ?Follow Up Plan: SW will follow up with patient by phone over the next 14 business days ? ? ?Cassidy Wisdom, LCSW ?Clinical Social Worker  ?[facility] ?[phone] ? ?

## 2021-08-08 ENCOUNTER — Ambulatory Visit: Payer: BC Managed Care – PPO | Admitting: Podiatry

## 2021-08-08 ENCOUNTER — Encounter: Payer: Self-pay | Admitting: Podiatry

## 2021-08-08 ENCOUNTER — Ambulatory Visit: Payer: BC Managed Care – PPO

## 2021-08-08 DIAGNOSIS — M7750 Other enthesopathy of unspecified foot: Secondary | ICD-10-CM

## 2021-08-08 DIAGNOSIS — M722 Plantar fascial fibromatosis: Secondary | ICD-10-CM | POA: Diagnosis not present

## 2021-08-08 MED ORDER — MELOXICAM 15 MG PO TABS: 15.0000 mg | ORAL_TABLET | Freq: Every day | ORAL | 3 refills | Status: DC

## 2021-08-08 NOTE — Patient Instructions (Signed)

## 2021-08-09 NOTE — Progress Notes (Signed)
?  Subjective:  ?Patient ID: Cassidy Kelly, female    DOB: May 23, 1970,  MRN: [id number] ? ?Chief Complaint  ?Patient presents with  ? Foot Pain  ?  (np) Heel pain, bilateral  ? ? ?51 y.o. female presents with the above complaint. History confirmed with patient.  Pain started in January she was working with a Systems analyst.  She started running and had some the pain started to develop its worsened since then.  It goes from the heel to the mid arch she felt like it would go away on its own but has not. ? ?Objective:  ?Physical Exam: ?warm, good capillary refill, no trophic changes or ulcerative lesions, normal DP and PT pulses, and normal sensory exam. ?Left Foot: point tenderness over the heel pad ?Right Foot: point tenderness over the heel pad ? ? ?Radiographs: ?Multiple views x-ray of both feet: no fracture, dislocation, swelling or degenerative changes noted and plantar calcaneal spur ?Assessment:  ? ?1. Plantar fasciitis, bilateral   ? ? ? ?Plan:  ?Patient was evaluated and treated and all questions answered. ? ?Discussed the etiology and treatment options for plantar fasciitis including stretching, formal physical therapy, supportive shoegears such as a running shoe or sneaker, pre fabricated orthoses, injection therapy, and oral medications. We also discussed the role of surgical treatment of this for patients who do not improve after exhausting non-surgical treatment options. ? ? ?-XR reviewed with patient ?-Educated patient on stretching and icing of the affected limb ?-Injection delivered to the plantar fascia of both feet. ?-Rx for meloxicam. Educated on use, risks and benefits of the medication ?-I do think she would benefit from custom molded longitudinal foot orthosis to offload the plantar fascia medial arch and prevent overload of this to alleviate pain and inflammation as a long-term solution.  We will have her scheduled to see our orthotist.  I will see her back in 1 month for  follow-up. ? ?After sterile prep with povidone-iodine solution and alcohol, the bilateral heel was injected with 0.5cc 2% xylocaine plain, 0.5cc 0.5% marcaine plain, 5mg  triamcinolone acetonide, and 2mg  dexamethasone was injected along the medial plantar fascia at the insertion on the plantar calcaneus. The patient tolerated the procedure well without complication. ? ?Return in about 1 month (around 09/08/2021) for recheck plantar fasciitis.  ? ?

## 2021-08-18 ENCOUNTER — Telehealth: Payer: BC Managed Care – PPO

## 2021-08-18 ENCOUNTER — Telehealth: Payer: Self-pay | Admitting: *Deleted

## 2021-08-18 NOTE — Telephone Encounter (Signed)
  Care Management   Follow Up Note   08/18/2021 Name: Cassidy Kelly MRN: [id number] DOB: 10/18/1970   Referred by: Jacky Kindle, FNP Reason for referral : Care Coordination   An unsuccessful telephone outreach was attempted today. The patient was referred to the case management team for assistance with care management and care coordination.   Follow Up Plan: Telephone follow up appointment with care management team member to be re-scheduled by careguide   Verna Czech, LCSW Clinical Social Worker  [facility] [phone]

## 2021-08-19 ENCOUNTER — Ambulatory Visit: Payer: BC Managed Care – PPO | Admitting: Family Medicine

## 2021-08-20 ENCOUNTER — Other Ambulatory Visit: Payer: Self-pay | Admitting: Family Medicine

## 2021-08-26 ENCOUNTER — Encounter: Payer: Self-pay | Admitting: Family Medicine

## 2021-09-06 ENCOUNTER — Telehealth: Payer: Self-pay | Admitting: *Deleted

## 2021-09-06 ENCOUNTER — Ambulatory Visit: Payer: BC Managed Care – PPO | Admitting: Family Medicine

## 2021-09-06 ENCOUNTER — Encounter: Payer: Self-pay | Admitting: Family Medicine

## 2021-09-06 VITALS — BP 142/88 | HR 61 | Temp 98.4°F | Resp 16 | Ht 62.0 in | Wt 217.3 lb

## 2021-09-06 DIAGNOSIS — I10 Essential (primary) hypertension: Secondary | ICD-10-CM | POA: Diagnosis not present

## 2021-09-06 DIAGNOSIS — F339 Major depressive disorder, recurrent, unspecified: Secondary | ICD-10-CM

## 2021-09-06 MED ORDER — LOSARTAN POTASSIUM-HCTZ 100-25 MG PO TABS: 1.0000 | ORAL_TABLET | Freq: Every day | ORAL | 1 refills | Status: DC

## 2021-09-06 MED ORDER — AMLODIPINE BESYLATE 10 MG PO TABS: 10.0000 mg | ORAL_TABLET | Freq: Every day | ORAL | 1 refills | Status: DC

## 2021-09-06 NOTE — Chronic Care Management (AMB) (Signed)
  Care Coordination Note  09/06/2021 Name: Cassidy Kelly MRN: [id number] DOB: 1971-01-31  Cassidy Kelly is a 50 y.o. year old female who is a primary care patient of Jacky Kindle, FNP and is actively engaged with the care management team. I reached out to Cassidy Kelly by phone today to assist with re-scheduling a follow up visit with the Licensed Clinical Social Worker  Follow up plan: Unsuccessful telephone outreach attempt made. A HIPAA compliant phone message was left for the patient providing contact information and requesting a return call.   Burman Nieves, CCMA Care Guide, Embedded Care Coordination [facility]  Care Management  Direct Dial: [phone]

## 2021-09-06 NOTE — Progress Notes (Signed)
Established patient visit   Patient: Cassidy Kelly   DOB: [birth year]   51 y.o. Female  MRN: [id number] Visit Date: 09/06/2021  Today's healthcare provider: Gwyneth Sprout, FNP   I,Tiffany J Bragg,acting as a scribe for Gwyneth Sprout, FNP.,have documented all relevant documentation on the behalf of Gwyneth Sprout, FNP,as directed by  Gwyneth Sprout, FNP while in the presence of Gwyneth Sprout, FNP.   Chief Complaint  Patient presents with   Hypertension   Fatigue    Patient complains of not being able to sleep, not having energy and fatigue.  Also complains of being overly emotional and crying for no reason.    Bleeding/Bruising    Patient complains of bruising easily starting last week.    Subjective    HPI HPI     Fatigue    Additional comments: Patient complains of not being able to sleep, not having energy and fatigue.  Also complains of being overly emotional and crying for no reason.         Bleeding/Bruising    Additional comments: Patient complains of bruising easily starting last week.       Last edited by Smitty Knudsen, CMA on 09/06/2021  9:20 AM.      Hypertension, follow-up  BP Readings from Last 3 Encounters:  09/06/21 (!) 142/88  07/19/21 (!) 143/82  06/27/21 114/78   Wt Readings from Last 3 Encounters:  09/06/21 217 lb 4.8 oz (98.6 kg)  07/19/21 214 lb 9.6 oz (97.3 kg)  05/19/21 214 lb (97.1 kg)     She was last seen for hypertension 5 weeks ago.  BP at that visit was 143/82. Management since that visit includes continue medication.  She reports excellent compliance with treatment. She is not having side effects, but states she has been getting dizzy. She is following a  poor  diet. She is not exercising. She does not smoke.  Use of agents associated with hypertension: none.   Outside blood pressures are not checked recently. Symptoms: No chest pain No chest pressure  No palpitations No syncope  No dyspnea No orthopnea  No  paroxysmal nocturnal dyspnea No lower extremity edema   Pertinent labs Lab Results  Component Value Date   CHOL 261 (H) 04/29/2021   HDL 73 04/29/2021   LDLCALC 174 (H) 04/29/2021   TRIG 83 04/29/2021   CHOLHDL 3.6 04/29/2021   Lab Results  Component Value Date   NA 138 04/29/2021   K 4.1 04/29/2021   CREATININE 0.83 04/29/2021   EGFR 85 04/29/2021   GLUCOSE 117 (H) 04/29/2021   TSH 2.590 04/29/2021     The 10-year ASCVD risk score (Arnett DK, et al., 2019) is: 5.4%  ---------------------------------------------------------------------------------------------------   Medications: Outpatient Medications Prior to Visit  Medication Sig   buPROPion (WELLBUTRIN XL) 150 MG 24 hr tablet TAKE 1 TABLET BY MOUTH DAILY FOR 3 WEEKS, THEN 2 TABLETS(300MG) THEREAFTER   busPIRone (BUSPAR) 7.5 MG tablet TAKE 1 TABLET(7.5 MG) BY MOUTH TWICE DAILY   cholecalciferol (VITAMIN D3) 25 MCG (1000 UNIT) tablet Take 1,000 Units by mouth daily.   FLUoxetine (PROZAC) 10 MG capsule Take 1 capsule (10 mg total) by mouth daily.   metaxalone (SKELAXIN) 800 MG tablet Take 1 tablet (800 mg total) by mouth 3 (three) times daily as needed for muscle spasms.   Multiple Vitamin (MULTIVITAMIN) tablet Take 1 tablet by mouth daily.   scopolamine (TRANSDERM-SCOP) 1 MG/3DAYS Place 1  patch (1.5 mg total) onto the skin every 3 (three) days.   TURMERIC PO Take by mouth.   [DISCONTINUED] amLODipine (NORVASC) 10 MG tablet Take 1 tablet (10 mg total) by mouth daily.   [DISCONTINUED] meloxicam (MOBIC) 15 MG tablet Take 1 tablet (15 mg total) by mouth daily. (Patient not taking: Reported on 09/06/2021)   [DISCONTINUED] methylPREDNISolone (MEDROL DOSEPAK) 4 MG TBPK tablet Take by mouth.   No facility-administered medications prior to visit.    Review of Systems  Last CBC Lab Results  Component Value Date   WBC 9.8 07/19/2021   HGB 12.2 07/19/2021   HCT 35.9 07/19/2021   MCV 88 07/19/2021   MCH 29.8 07/19/2021   RDW  13.2 07/19/2021   PLT 235 54/49/2010   Last metabolic panel Lab Results  Component Value Date   GLUCOSE 117 (H) 04/29/2021   NA 138 04/29/2021   K 4.1 04/29/2021   CL 102 04/29/2021   CO2 23 04/29/2021   BUN 12 04/29/2021   CREATININE 0.83 04/29/2021   EGFR 85 04/29/2021   CALCIUM 9.5 04/29/2021   PROT 7.0 04/29/2021   ALBUMIN 4.6 04/29/2021   LABGLOB 2.4 04/29/2021   AGRATIO 1.9 04/29/2021   BILITOT 0.3 04/29/2021   ALKPHOS 134 (H) 04/29/2021   AST 28 04/29/2021   ALT 42 (H) 04/29/2021   ANIONGAP 10 06/14/2017   Last lipids Lab Results  Component Value Date   CHOL 261 (H) 04/29/2021   HDL 73 04/29/2021   LDLCALC 174 (H) 04/29/2021   TRIG 83 04/29/2021   CHOLHDL 3.6 04/29/2021   Last hemoglobin A1c Lab Results  Component Value Date   HGBA1C 6.2 (H) 04/29/2021   Last thyroid functions Lab Results  Component Value Date   TSH 2.590 04/29/2021   Last vitamin D Lab Results  Component Value Date   VD25OH 32.3 04/29/2021   Last vitamin B12 and Folate Lab Results  Component Value Date   VITAMINB12 516 04/29/2021   FOLATE >20.0 04/29/2021       Objective    BP (!) 142/88 (BP Location: Right Arm, Patient Position: Sitting, Cuff Size: Normal)   Pulse 61   Temp 98.4 F (36.9 C) (Oral)   Resp 16   Ht _0  (1.575 m)   Wt 217 lb 4.8 oz (98.6 kg)   LMP 08/01/2017   SpO2 98%   BMI 39.74 kg/m  BP Readings from Last 3 Encounters:  09/06/21 (!) 142/88  07/19/21 (!) 143/82  06/27/21 114/78   Wt Readings from Last 3 Encounters:  09/06/21 217 lb 4.8 oz (98.6 kg)  07/19/21 214 lb 9.6 oz (97.3 kg)  05/19/21 214 lb (97.1 kg)   SpO2 Readings from Last 3 Encounters:  09/06/21 98%  06/27/21 98%  05/19/21 100%      Physical Exam Vitals and nursing note reviewed.  Constitutional:      General: She is not in acute distress.    Appearance: Normal appearance. She is obese. She is not ill-appearing, toxic-appearing or diaphoretic.  HENT:     Head:  Normocephalic and atraumatic.  Cardiovascular:     Rate and Rhythm: Normal rate and regular rhythm.     Pulses: Normal pulses.     Heart sounds: Normal heart sounds. No murmur heard.    No friction rub. No gallop.  Pulmonary:     Effort: Pulmonary effort is normal. No respiratory distress.     Breath sounds: Normal breath sounds. No stridor. No wheezing, rhonchi or rales.  Chest:     Chest wall: No tenderness.  Abdominal:     General: Bowel sounds are normal.     Palpations: Abdomen is soft.  Musculoskeletal:        General: No swelling, tenderness, deformity or signs of injury. Normal range of motion.     Right lower leg: No edema.     Left lower leg: No edema.  Skin:    General: Skin is warm and dry.     Capillary Refill: Capillary refill takes less than 2 seconds.     Coloration: Skin is not jaundiced or pale.     Findings: No bruising, erythema, lesion or rash.  Neurological:     General: No focal deficit present.     Mental Status: She is alert and oriented to person, place, and time. Mental status is at baseline.     Cranial Nerves: No cranial nerve deficit.     Sensory: No sensory deficit.     Motor: No weakness.     Coordination: Coordination normal.  Psychiatric:        Mood and Affect: Mood normal.        Behavior: Behavior normal.        Thought Content: Thought content normal.        Judgment: Judgment normal.      No results found for any visits on 09/06/21.  Assessment & Plan     Problem List Items Addressed This Visit       Cardiovascular and Mediastinum   Essential hypertension    Chronic, elevated Recommend use of second agent  Noted to not have taken Norvasc in last 2 days due to lack of supply Add Hyzaar 100-25 in addition to assist in meeting BP goals Denies CP Denies SOB/ DOE Denies low blood pressure/hypotension Denies vision changes No LE Edema noted on exam Seek emergent care if you develop chest pain or chest pressure F/U in 2 months          Relevant Medications   losartan-hydrochlorothiazide (HYZAAR) 100-25 MG tablet   amLODipine (NORVASC) 10 MG tablet     Other   Depression, recurrent (HCC) - Primary    Chronic, worsening Was able to contact briefly with CCM team; recommend additional time and concern to approaching re-start into therapy Encourage SMART goals, specific measurable attainable realistic timely, and avoidance of black/white, all/nothing thinking patterns Denies SI or HI Committed to safety        Return in about 8 weeks (around 11/01/2021) for anxiety and depression.      Vonna Kotyk, FNP, have reviewed all documentation for this visit. The documentation on 09/06/21 for the exam, diagnosis, procedures, and orders are all accurate and complete.    Gwyneth Sprout, [place] [phone] (phone) [phone] (fax)  [place]

## 2021-09-06 NOTE — Assessment & Plan Note (Signed)
Chronic, worsening Was able to contact briefly with CCM team; recommend additional time and concern to approaching re-start into therapy Encourage SMART goals, specific measurable attainable realistic timely, and avoidance of black/white, all/nothing thinking patterns Denies SI or HI Committed to safety

## 2021-09-06 NOTE — Assessment & Plan Note (Signed)
Chronic, elevated Recommend use of second agent  Noted to not have taken Norvasc in last 2 days due to lack of supply Add Hyzaar 100-25 in addition to assist in meeting BP goals Denies CP Denies SOB/ DOE Denies low blood pressure/hypotension Denies vision changes No LE Edema noted on exam Seek emergent care if you develop chest pain or chest pressure F/U in 2 months

## 2021-09-07 ENCOUNTER — Ambulatory Visit: Payer: BC Managed Care – PPO | Admitting: Podiatry

## 2021-09-07 DIAGNOSIS — M722 Plantar fascial fibromatosis: Secondary | ICD-10-CM

## 2021-09-09 ENCOUNTER — Ambulatory Visit: Payer: BC Managed Care – PPO

## 2021-09-09 DIAGNOSIS — M722 Plantar fascial fibromatosis: Secondary | ICD-10-CM

## 2021-09-09 NOTE — Progress Notes (Signed)
SITUATION Reason for Consult: Evaluation for Bilateral Custom Foot Orthoses Patient / Caregiver Report: Patient is ready for foot orthotics  OBJECTIVE DATA: Patient History / Diagnosis:    ICD-10-CM   1. Plantar fasciitis, bilateral  M72.2       Current or Previous Devices:   None and no history  Foot Examination: Skin presentation:   Intact Ulcers & Callousing:   none Toe / Foot Deformities:  none Weight Bearing Presentation:  rectus Sensation:    intact  Shoe Size:    7.5  ORTHOTIC RECOMMENDATION Recommended Device: 1x pair of custom functional foot orthotics  GOALS OF ORTHOSES - Reduce Pain - Prevent Foot Deformity - Prevent Progression of Further Foot Deformity - Relieve Pressure - Improve the Overall Biomechanical Function of the Foot and Lower Extremity.  ACTIONS PERFORMED Potential out of pocket cost was communicated to patient. Patient understood and consent to casting. Patient was casted for Foot Orthoses via crush box. Procedure was explained and patient tolerated procedure well. Casts were shipped to central fabrication. All questions were answered and concerns addressed.  PLAN Patient is to be called for fitting when devices are ready.

## 2021-09-10 ENCOUNTER — Encounter: Payer: Self-pay | Admitting: Podiatry

## 2021-09-10 NOTE — Progress Notes (Signed)
  Subjective:  Patient ID: Cassidy Kelly, female    DOB: Jul 18, 1970,  MRN: [id number]  Chief Complaint  Patient presents with   Plantar Fasciitis    4 week follow up    51 y.o. female presents with the above complaint. History confirmed with patient.  Pain started in January she was working with a Systems analyst.  She started running and had some the pain started to develop its worsened since then.  It goes from the heel to the mid arch she felt like it would go away on its own but has not.  Interval history: She is doing very well the last injection helped quite a bit and she feels like she is nearly 100% now  Objective:  Physical Exam: warm, good capillary refill, no trophic changes or ulcerative lesions, normal DP and PT pulses, and normal sensory exam. On the bilateral foot she has minimal to no tenderness in the plantar heel   Radiographs: Multiple views x-ray of both feet: no fracture, dislocation, swelling or degenerative changes noted and plantar calcaneal spur Assessment:   1. Plantar fasciitis, bilateral      Plan:  Patient was evaluated and treated and all questions answered.  Discussed the etiology and treatment options for plantar fasciitis including stretching, formal physical therapy, supportive shoegears such as a running shoe or sneaker, pre fabricated orthoses, injection therapy, and oral medications. We also discussed the role of surgical treatment of this for patients who do not improve after exhausting non-surgical treatment options.   Overall doing well after the last injection and did not require further injection therapy.  She may use the anti-inflammatory as needed now and continue her home exercises until this is completely pain-free.  We discussed the risk of recurrence and the role of orthotics and supporting the arch and foot and how this may reduce this.  She will be seen by the orthotist for casting for these.  I will see her back as needed if  this returns or worsens.  Return if symptoms worsen or fail to improve.

## 2021-09-14 NOTE — Chronic Care Management (AMB) (Signed)
  Care Coordination Note  09/14/2021 Name: Cassidy Kelly MRN: [id number] DOB: Aug 19, 1970  Cassidy Kelly is a 51 y.o. year old female who is a primary care patient of Jacky Kindle, FNP and is actively engaged with the care management team. I reached out to Cassidy Kelly by phone today to assist with re-scheduling a follow up visit with the Licensed Clinical Social Worker  Follow up plan: Telephone appointment with care management team member scheduled for: 09/22/2021  Burman Nieves, CCMA Care Guide, Embedded Care Coordination [facility]  Care Management  Direct Dial: [phone]

## 2021-09-22 ENCOUNTER — Other Ambulatory Visit: Payer: Self-pay | Admitting: Family Medicine

## 2021-09-22 ENCOUNTER — Ambulatory Visit: Payer: BC Managed Care – PPO | Admitting: *Deleted

## 2021-09-22 DIAGNOSIS — R635 Abnormal weight gain: Secondary | ICD-10-CM

## 2021-09-22 DIAGNOSIS — F4329 Adjustment disorder with other symptoms: Secondary | ICD-10-CM

## 2021-09-22 DIAGNOSIS — I10 Essential (primary) hypertension: Secondary | ICD-10-CM

## 2021-09-22 DIAGNOSIS — F339 Major depressive disorder, recurrent, unspecified: Secondary | ICD-10-CM

## 2021-09-22 DIAGNOSIS — R7303 Prediabetes: Secondary | ICD-10-CM

## 2021-09-22 NOTE — Patient Instructions (Signed)
Visit Information  Thank you for taking time to visit with me today. Please don't hesitate to contact me if I can be of assistance to you before our next scheduled telephone appointment.  Following are the goals we discussed today:  - begin personal counseling-[facility] [phone] - start or continue a personal journal - talk about feelings with a friend, family or spiritual advisor - practice positive thinking and self-talk  If you are experiencing a Mental Health or Behavioral Health Crisis or need someone to talk to, please call the Suicide and Crisis Lifeline: 988   Patient verbalizes understanding of instructions and care plan provided today and agrees to view in MyChart. Active MyChart status and patient understanding of how to access instructions and care plan via MyChart confirmed with patient.     No further follow up required: patient to contact [facility] to schedule initial appointment   Verna Czech, LCSW Clinical Social Worker  [facility] Care Management [phone]

## 2021-09-22 NOTE — Chronic Care Management (AMB) (Signed)
Care Management Clinical Social Work Note  09/22/2021 Name: Cassidy Kelly MRN: [id number] DOB: Dec 16, 1970  Cassidy Kelly is a 51 y.o. year old female who is a primary care patient of Jacky Kindle, FNP.  The Care Management team was consulted for assistance with chronic disease management and coordination needs.  Engaged with patient by telephone for follow up visit in response to provider referral for social work chronic care management and care coordination services  Consent to Services:  Ms. Leask was given information about Care Management services today including:  Care Management services includes personalized support from designated clinical staff supervised by her physician, including individualized plan of care and coordination with other care providers 24/7 contact phone numbers for assistance for urgent and routine care needs. The patient may stop case management services at any time by phone call to the office staff.  Patient agreed to services and consent obtained.   Assessment: Review of patient past medical history, allergies, medications, and health status, including review of relevant consultants reports was performed today as part of a comprehensive evaluation and provision of chronic care management and care coordination services.  SDOH (Social Determinants of Health) assessments and interventions performed:    Advanced Directives Status: Not addressed in this encounter.  Care Plan  Allergies  Allergen Reactions   Other     Cats   Pollen Extract     Outpatient Encounter Medications as of 09/22/2021  Medication Sig   amLODipine (NORVASC) 10 MG tablet Take 1 tablet (10 mg total) by mouth daily.   buPROPion (WELLBUTRIN XL) 150 MG 24 hr tablet TAKE 1 TABLET BY MOUTH DAILY FOR 3 WEEKS, THEN 2 TABLETS(300MG ) THEREAFTER   busPIRone (BUSPAR) 7.5 MG tablet TAKE 1 TABLET(7.5 MG) BY MOUTH TWICE DAILY   cholecalciferol (VITAMIN D3) 25 MCG (1000 UNIT) tablet Take  1,000 Units by mouth daily.   FLUoxetine (PROZAC) 10 MG capsule Take 1 capsule (10 mg total) by mouth daily.   losartan-hydrochlorothiazide (HYZAAR) 100-25 MG tablet Take 1 tablet by mouth daily.   metaxalone (SKELAXIN) 800 MG tablet Take 1 tablet (800 mg total) by mouth 3 (three) times daily as needed for muscle spasms.   Multiple Vitamin (MULTIVITAMIN) tablet Take 1 tablet by mouth daily.   scopolamine (TRANSDERM-SCOP) 1 MG/3DAYS Place 1 patch (1.5 mg total) onto the skin every 3 (three) days.   TURMERIC PO Take by mouth.   No facility-administered encounter medications on file as of 09/22/2021.    Patient Active Problem List   Diagnosis Date Noted   Depression, recurrent (HCC) 07/19/2021   Motion sickness 07/19/2021   Trigger thumb of left hand 07/19/2021   Heel pain, bilateral 07/19/2021   Glands swollen 07/19/2021   Inattention 05/19/2021   Pre-diabetes 05/19/2021   Stress and adjustment reaction 05/19/2021   Acute pain of left shoulder 02/25/2021   Morbid obesity (HCC) 09/24/2020   Chronic fatigue 09/24/2020   Insomnia 09/24/2020   Hypercholesterolemia 08/08/2017   Essential hypertension 08/08/2017   Vertigo 07/17/2017    Conditions to be addressed/monitored: Depression; Mental Health Concerns   Care Plan : General Social Work (Adult)  Updates made by Cassidy Overland, LCSW since 09/22/2021 12:00 AM     Problem: CHL AMB "PATIENT-SPECIFIC PROBLEM"   Note:   CARE PLAN ENTRY (see longitudinal plan of care for additional care plan information)  Current Barriers:  Knowledge deficits related to accessing mental health provider in patient with Depression  Patient is experiencing symptoms of  depression  which seem to be exacerbated by recent separation from spouse.     Patient needs Support, Education, and Care Coordination in order to meet unmet mental health needs  Mental Health Concerns   Clinical Social Work Goal(s):  Over the next 90 days, patient will work with SW  bi-weekly by telephone or in person to reduce or manage symptoms of depression until connected for ongoing counseling resources.  Patient will implement clinical interventions discussed today to decreases symptoms of depression and increase knowledge and/or ability of: self-management skills.   Interventions:  Assessed patient's understanding, education, previous treatment and care coordination needs  Provided basic mental health support, education and interventions related to depressive symptoms expressed Confirmed that patient is now agreeable to ongoing mental health treatment and is willing to contact previous therapist through [facility] to scheduled Patient requesting referral to a nutritionist as well-provider notified Collaborated with appropriate clinical care team members regarding patient needs Reviewed mental health medications with patient prescribed by PCP and discussed compliance  Other interventions include: Motivational Interviewing employed Active listening / Reflection utilized  Industrial/product designer Provided  Patient verbalized no additional community resource needs at this time, positive reinforcement provided for motivation for ongoing mental health treatment  Patient Self Care Activities & Deficits:  Patient is unable to independently navigate community resource options without care coordination support Patient is able to implement clinical interventions discussed today and is motivated for treatment  Patient will select one of the agencies from the list provided and call to schedule an appointment Attends all scheduled provider appointments Performs ADL's independently Performs IADL's independently  Initial goal documentation       Follow Up Plan: Client will contact [facility] to schedule appointment for ongoing mental health treatment.   Cassidy Czech, LCSW Clinical Social Worker  [facility] Care  Management [phone]

## 2021-10-21 ENCOUNTER — Ambulatory Visit (INDEPENDENT_AMBULATORY_CARE_PROVIDER_SITE_OTHER): Payer: BC Managed Care – PPO | Admitting: Podiatry

## 2021-10-21 DIAGNOSIS — M722 Plantar fascial fibromatosis: Secondary | ICD-10-CM

## 2021-10-21 NOTE — Progress Notes (Signed)
Patient presents to pick up orthotics.  The orthotics fit well.  The wearing instructions were given.  The patient was advised to come back as needed and to call if she has any issues.

## 2021-10-21 NOTE — Patient Instructions (Signed)

## 2021-11-01 ENCOUNTER — Ambulatory Visit: Payer: BC Managed Care – PPO | Admitting: Family Medicine

## 2021-11-01 ENCOUNTER — Encounter: Payer: Self-pay | Admitting: Family Medicine

## 2021-11-01 VITALS — BP 112/76 | HR 67 | Temp 98.0°F | Resp 17 | Ht 62.0 in | Wt 221.0 lb

## 2021-11-01 DIAGNOSIS — F4329 Adjustment disorder with other symptoms: Secondary | ICD-10-CM | POA: Diagnosis not present

## 2021-11-01 DIAGNOSIS — F339 Major depressive disorder, recurrent, unspecified: Secondary | ICD-10-CM

## 2021-11-01 MED ORDER — BUPROPION HCL ER (XL) 150 MG PO TB24: 150.0000 mg | ORAL_TABLET | Freq: Every day | ORAL | 3 refills | Status: DC

## 2021-11-01 NOTE — Assessment & Plan Note (Signed)
Chronic, improved PHQ 9 reviewed Feels stable at this time with current dosages; denies increase Sees therapist weekly- Cassidy Kelly at [facility]

## 2021-11-01 NOTE — Assessment & Plan Note (Signed)
Chronic, improved -continue wellbutrin 150 -continue prozac 10  -stop buspar as previously stopped by patient OK for 6 month f/u Currently seen by Noralee Chars, [facility] for therapy

## 2021-11-01 NOTE — Progress Notes (Signed)
Established patient visit   Patient: Cassidy Kelly   DOB: 21-Oct-1970   51 y.o. Female  MRN: [id number] Visit Date: 11/01/2021  Today's healthcare provider: Gwyneth Sprout, FNP  Introduced to nurse practitioner role and practice setting.  All questions answered.  Discussed provider/patient relationship and expectations.   I,Tiffany J Bragg,acting as a scribe for Gwyneth Sprout, FNP.,have documented all relevant documentation on the behalf of Gwyneth Sprout, FNP,as directed by  Gwyneth Sprout, FNP while in the presence of Gwyneth Sprout, FNP.   Chief Complaint  Patient presents with   Anxiety   Depression   Subjective    HPI  Anxiety/Depression, Follow-up  She was last seen for anxiety 2 months ago. Changes made at last visit include start prozac 16m.   She reports excellent compliance with treatment. She reports excellent tolerance of treatment. She is not having side effects.   She feels her anxiety is mild and Unchanged since last visit.  Symptoms: No chest pain Yes difficulty concentrating  Yes dizziness Yes fatigue  No feelings of losing control Yes insomnia  No irritable No palpitations  No panic attacks No racing thoughts  No shortness of breath Yes sweating  No tremors/shakes    GAD-7 Results    11/01/2021    3:52 PM 07/19/2021    4:17 PM  GAD-7 Generalized Anxiety Disorder Screening Tool  1. Feeling Nervous, Anxious, or on Edge 1 1  2. Not Being Able to Stop or Control Worrying 0 1  3. Worrying Too Much About Different Things 1 2  4. Trouble Relaxing 2 3  5. Being So Restless it's Hard To Sit Still 1 3  6. Becoming Easily Annoyed or Irritable 0 1  7. Feeling Afraid As If Something Awful Might Happen 0 1  Total GAD-7 Score 5 12  Difficulty At Work, Home, or Getting  Along With Others? Somewhat difficult Somewhat difficult    PHQ-9 Scores    11/01/2021    3:51 PM 09/06/2021    9:22 AM 08/04/2021   11:31 AM  PHQ9 SCORE ONLY  PHQ-9 Total Score _0 ---------------------------------------------------------------------------------------------------   Medications: Outpatient Medications Prior to Visit  Medication Sig   amLODipine (NORVASC) 10 MG tablet Take 1 tablet (10 mg total) by mouth daily.   cholecalciferol (VITAMIN D3) 25 MCG (1000 UNIT) tablet Take 1,000 Units by mouth daily.   FLUoxetine (PROZAC) 10 MG capsule Take 1 capsule (10 mg total) by mouth daily.   losartan-hydrochlorothiazide (HYZAAR) 100-25 MG tablet Take 1 tablet by mouth daily.   metaxalone (SKELAXIN) 800 MG tablet Take 1 tablet (800 mg total) by mouth 3 (three) times daily as needed for muscle spasms.   Multiple Vitamin (MULTIVITAMIN) tablet Take 1 tablet by mouth daily.   scopolamine (TRANSDERM-SCOP) 1 MG/3DAYS Place 1 patch (1.5 mg total) onto the skin every 3 (three) days.   TURMERIC PO Take by mouth.   [DISCONTINUED] buPROPion (WELLBUTRIN XL) 150 MG 24 hr tablet TAKE 1 TABLET BY MOUTH DAILY FOR 3 WEEKS, THEN 2 TABLETS(300MG) THEREAFTER   [DISCONTINUED] busPIRone (BUSPAR) 7.5 MG tablet TAKE 1 TABLET(7.5 MG) BY MOUTH TWICE DAILY (Patient not taking: Reported on 11/01/2021)   No facility-administered medications prior to visit.    Review of Systems  Last CBC Lab Results  Component Value Date   WBC 9.8 07/19/2021   HGB 12.2 07/19/2021   HCT 35.9 07/19/2021   MCV 88 07/19/2021  MCH 29.8 07/19/2021   RDW 13.2 07/19/2021   PLT 235 25/63/8937   Last metabolic panel Lab Results  Component Value Date   GLUCOSE 117 (H) 04/29/2021   NA 138 04/29/2021   K 4.1 04/29/2021   CL 102 04/29/2021   CO2 23 04/29/2021   BUN 12 04/29/2021   CREATININE 0.83 04/29/2021   EGFR 85 04/29/2021   CALCIUM 9.5 04/29/2021   PROT 7.0 04/29/2021   ALBUMIN 4.6 04/29/2021   LABGLOB 2.4 04/29/2021   AGRATIO 1.9 04/29/2021   BILITOT 0.3 04/29/2021   ALKPHOS 134 (H) 04/29/2021   AST 28 04/29/2021   ALT 42 (H) 04/29/2021   ANIONGAP 10 06/14/2017   Last lipids Lab  Results  Component Value Date   CHOL 261 (H) 04/29/2021   HDL 73 04/29/2021   LDLCALC 174 (H) 04/29/2021   TRIG 83 04/29/2021   CHOLHDL 3.6 04/29/2021   Last hemoglobin A1c Lab Results  Component Value Date   HGBA1C 6.2 (H) 04/29/2021   Last thyroid functions Lab Results  Component Value Date   TSH 2.590 04/29/2021   Last vitamin D Lab Results  Component Value Date   VD25OH 32.3 04/29/2021   Last vitamin B12 and Folate Lab Results  Component Value Date   VITAMINB12 516 04/29/2021   FOLATE >20.0 04/29/2021       Objective    BP 112/76 (BP Location: Left Arm, Patient Position: Sitting, Cuff Size: Large)   Pulse 67   Temp 98 F (36.7 C) (Oral)   Resp 17   Ht _0  (1.575 m)   Wt 221 lb (100.2 kg)   LMP 08/01/2017   SpO2 97%   BMI 40.42 kg/m   BP Readings from Last 3 Encounters:  11/01/21 112/76  09/06/21 (!) 142/88  07/19/21 (!) 143/82   Wt Readings from Last 3 Encounters:  11/01/21 221 lb (100.2 kg)  09/06/21 217 lb 4.8 oz (98.6 kg)  07/19/21 214 lb 9.6 oz (97.3 kg)   SpO2 Readings from Last 3 Encounters:  11/01/21 97%  09/06/21 98%  06/27/21 98%      Physical Exam Vitals and nursing note reviewed.  Constitutional:      General: She is not in acute distress.    Appearance: Normal appearance. She is obese. She is not ill-appearing, toxic-appearing or diaphoretic.  HENT:     Head: Normocephalic and atraumatic.  Cardiovascular:     Rate and Rhythm: Normal rate and regular rhythm.     Pulses: Normal pulses.     Heart sounds: Normal heart sounds. No murmur heard.    No friction rub. No gallop.  Pulmonary:     Effort: Pulmonary effort is normal. No respiratory distress.     Breath sounds: Normal breath sounds. No stridor. No wheezing, rhonchi or rales.  Chest:     Chest wall: No tenderness.  Musculoskeletal:        General: No swelling, tenderness, deformity or signs of injury. Normal range of motion.     Right lower leg: No edema.     Left  lower leg: No edema.  Skin:    General: Skin is warm and dry.     Capillary Refill: Capillary refill takes less than 2 seconds.     Coloration: Skin is not jaundiced or pale.     Findings: No bruising, erythema, lesion or rash.  Neurological:     General: No focal deficit present.     Mental Status: She is alert and oriented  to person, place, and time. Mental status is at baseline.     Cranial Nerves: No cranial nerve deficit.     Sensory: No sensory deficit.     Motor: No weakness.     Coordination: Coordination normal.  Psychiatric:        Mood and Affect: Mood normal.        Behavior: Behavior normal.        Thought Content: Thought content normal.        Judgment: Judgment normal.     No results found for any visits on 11/01/21.  Assessment & Plan     Problem List Items Addressed This Visit       Other   Depression, recurrent ([place]) - Primary    Chronic, improved PHQ 9 reviewed Feels stable at this time with current dosages; denies increase Sees therapist weekly- Barrett Shell at [facility]      Relevant Medications   buPROPion (WELLBUTRIN XL) 150 MG 24 hr tablet   Stress and adjustment reaction    Chronic, improved -continue wellbutrin 150 -continue prozac 10  -stop buspar as previously stopped by patient OK for 6 month f/u Currently seen by Barrett Shell, [facility] for therapy       Relevant Medications   buPROPion (WELLBUTRIN XL) 150 MG 24 hr tablet     Return in about 6 months (around 05/04/2022) for chonic disease management.      Vonna Kotyk, FNP, have reviewed all documentation for this visit. The documentation on 11/01/21 for the exam, diagnosis, procedures, and orders are all accurate and complete.    Gwyneth Sprout, [place] [phone] (phone) [phone] (fax)  [place]

## 2021-11-07 ENCOUNTER — Ambulatory Visit: Payer: BC Managed Care – PPO | Admitting: Family Medicine

## 2021-11-07 ENCOUNTER — Encounter: Payer: Self-pay | Admitting: Family Medicine

## 2021-11-07 ENCOUNTER — Telehealth: Payer: Self-pay | Admitting: Dietician

## 2021-11-07 VITALS — BP 103/73 | HR 62 | Temp 98.7°F | Resp 16 | Wt 215.5 lb

## 2021-11-07 DIAGNOSIS — J069 Acute upper respiratory infection, unspecified: Secondary | ICD-10-CM

## 2021-11-07 MED ORDER — BENZONATATE 200 MG PO CAPS: 200.0000 mg | ORAL_CAPSULE | Freq: Two times a day (BID) | ORAL | 0 refills | Status: DC | PRN

## 2021-11-07 NOTE — Telephone Encounter (Signed)
Returned voicemail message from patient to reschedule her initial MNT appointment on 11/14/21 due to work conflict. Rescheduled for 01/18/22 as first available 4pm slot; will reschedule to earlier date if there are any cancellations. Patient will also investigate another program if she can be seen sooner.

## 2021-11-07 NOTE — Progress Notes (Signed)
   SUBJECTIVE:   CHIEF COMPLAINT / HPI:   UPPER RESPIRATORY TRACT INFECTION - symptom onset Friday - worse at night - COVID negative x2, last test Friday  Fever: no Cough: yes, productive Shortness of breath:  at night due to cough Chest pain: yes, with cough Chest congestion: yes Nasal congestion: not as much Runny nose: no Sneezing: no Sore throat: no Sinus pressure: no Face pain: no Ear pain: no  Ear pressure: yes bilateral Vomiting: no Rash: no Fatigue: yes Sick contacts: yes Relief with OTC cold/cough medications: yes  Treatments attempted: cold/sinus, mucinex, and cough syrup    OBJECTIVE:   BP 103/73 (BP Location: Left Arm, Patient Position: Sitting, Cuff Size: Large)   Pulse 62   Temp 98.7 F (37.1 C) (Oral)   Resp 16   Wt 215 lb 8 oz (97.8 kg)   LMP 08/01/2017   SpO2 97%   BMI 39.42 kg/m   Gen: tired appearing, in NAD Card: RRR Lungs: CTAB Ext: WWP, no edema   ASSESSMENT/PLAN:   VIRAL URI Mild-mod sx. COVID negative x2.  Rx tessalon perles. Reviewed OTC symptom relief and return precautions.      Caro Laroche, DO

## 2021-11-09 ENCOUNTER — Ambulatory Visit: Payer: BC Managed Care – PPO | Admitting: Dietician

## 2021-11-14 ENCOUNTER — Ambulatory Visit: Payer: BC Managed Care – PPO | Admitting: Dietician

## 2021-12-28 ENCOUNTER — Ambulatory Visit: Payer: Self-pay | Admitting: *Deleted

## 2021-12-28 ENCOUNTER — Encounter: Payer: Self-pay | Admitting: Physician Assistant

## 2021-12-28 ENCOUNTER — Ambulatory Visit: Payer: BC Managed Care – PPO | Admitting: Physician Assistant

## 2021-12-28 VITALS — BP 134/70 | HR 83 | Temp 98.2°F | Resp 16

## 2021-12-28 DIAGNOSIS — R051 Acute cough: Secondary | ICD-10-CM | POA: Diagnosis not present

## 2021-12-28 DIAGNOSIS — J029 Acute pharyngitis, unspecified: Secondary | ICD-10-CM

## 2021-12-28 DIAGNOSIS — J069 Acute upper respiratory infection, unspecified: Secondary | ICD-10-CM

## 2021-12-28 LAB — POCT RAPID STREP A (OFFICE): Rapid Strep A Screen: NEGATIVE

## 2021-12-28 LAB — POC COVID19 BINAXNOW: SARS Coronavirus 2 Ag: NEGATIVE

## 2021-12-28 LAB — POCT INFLUENZA A/B
Influenza A, POC: NEGATIVE
Influenza B, POC: NEGATIVE

## 2021-12-28 NOTE — Telephone Encounter (Signed)
  Chief Complaint: allergies at first Symptoms: productive cough, gray Frequency: several days Pertinent Negatives: Patient denies fever Disposition: [] ED /[] Urgent Care (no appt availability in office) / [x] Appointment(In office/virtual)/ []  [facility]/ [] Home Care/ [] Refused Recommended Disposition /[] [facility]/ []  Follow-up with PCP Additional Notes: Appt this morning, home care discussed.   Reason for Disposition  [1] Taking antihistamines > 2 days AND [2] nasal allergy symptoms interfere with sleep, school, or work  Answer Assessment - Initial Assessment Questions 1. SYMPTOM: "What's the main symptom you're concerned about?" (e.g., runny nose, stuffiness, sneezing, itching)     ST and cough 2. SEVERITY: "How bad is it?" "What does it keep you from doing?" (e.g., sleeping, working)      sleeping 3. EYES: "Are the eyes also red, watery, and itchy?"      Watery at first and itchy but eye drops stopped that 4. TRIGGER: "What pollen or other allergic substance do you think is causing the symptoms?"      Allergies but now changed to something 5. TREATMENT: "What medicine are you using?" "What medicine worked best in the past?"     Coricedin,Max st flu, helps for a short while 6. OTHER SYMPTOMS: "Do you have any other symptoms?" (e.g., coughing, difficulty breathing, wheezing)     No SOB, coughing up gray Phlegm 7. PREGNANCY: "Is there any chance you are pregnant?" "When was your last menstrual period?"     no  Protocols used: Nasal Allergies (Hay Fever)-A-AH

## 2021-12-28 NOTE — Addendum Note (Signed)
Addended by: Randal Buba on: 12/28/2021 10:57 AM   Modules accepted: Orders

## 2021-12-28 NOTE — Progress Notes (Signed)
I,Roshena L Chambers,acting as a Neurosurgeon for [employer], PA-C.,have documented all relevant documentation on the behalf of Debera Lat, PA-C,as directed by  [employer], PA-C while in the presence of [employer], PA-C.   Established patient visit   Patient: Cassidy Kelly   DOB: [birth year]   51 y.o. Female  MRN: [id number] Visit Date: 12/28/2021  Today's healthcare provider: Debera Lat, PA-C   Chief Complaint  Patient presents with   Cough   Subjective    Cough This is a new problem. Episode onset: 4 days ago. The cough is Productive of sputum (gray colored). Associated symptoms include myalgias (leg pain), postnasal drip and a sore throat. Pertinent negatives include no chest pain, chills, fever or shortness of breath. The symptoms are aggravated by lying down. Treatments tried: eye drops and Coricidin HBP. The treatment provided mild relief.      Medications: Outpatient Medications Prior to Visit  Medication Sig   amLODipine (NORVASC) 10 MG tablet Take 1 tablet (10 mg total) by mouth daily.   buPROPion (WELLBUTRIN XL) 150 MG 24 hr tablet Take 1 tablet (150 mg total) by mouth daily.   cholecalciferol (VITAMIN D3) 25 MCG (1000 UNIT) tablet Take 1,000 Units by mouth daily.   FLUoxetine (PROZAC) 10 MG capsule Take 1 capsule (10 mg total) by mouth daily.   losartan-hydrochlorothiazide (HYZAAR) 100-25 MG tablet Take 1 tablet by mouth daily.   metaxalone (SKELAXIN) 800 MG tablet Take 1 tablet (800 mg total) by mouth 3 (three) times daily as needed for muscle spasms.   Multiple Vitamin (MULTIVITAMIN) tablet Take 1 tablet by mouth daily.   scopolamine (TRANSDERM-SCOP) 1 MG/3DAYS Place 1 patch (1.5 mg total) onto the skin every 3 (three) days.   TURMERIC PO Take by mouth.   [DISCONTINUED] benzonatate (TESSALON) 200 MG capsule Take 1 capsule (200 mg total) by mouth 2 (two) times daily as needed for cough. (Patient not taking: Reported on 12/28/2021)   No  facility-administered medications prior to visit.    Review of Systems  Constitutional:  Negative for appetite change, chills, fatigue and fever.  HENT:  Positive for congestion, postnasal drip and sore throat.   Eyes:  Positive for itching.  Respiratory:  Positive for cough. Negative for chest tightness and shortness of breath.   Cardiovascular:  Negative for chest pain and palpitations.  Gastrointestinal:  Negative for abdominal pain, nausea and vomiting.  Musculoskeletal:  Positive for myalgias (leg pain).  Neurological:  Positive for dizziness. Negative for weakness.       Objective    BP 134/70 (BP Location: Right Arm, Patient Position: Sitting, Cuff Size: Large)   Pulse 83   Temp 98.2 F (36.8 C) (Oral)   Resp 16   LMP 08/01/2017   SpO2 100% Comment: room air   Physical Exam Vitals reviewed.  Constitutional:      General: She is not in acute distress.    Appearance: She is well-developed.  HENT:     Head: Normocephalic and atraumatic.     Right Ear: Tympanic membrane, ear canal and external ear normal.     Left Ear: Tympanic membrane, ear canal and external ear normal.     Nose: Congestion and rhinorrhea present.     Mouth/Throat:     Pharynx: Oropharyngeal exudate and posterior oropharyngeal erythema present.  Eyes:     General: No scleral icterus.    Extraocular Movements: Extraocular movements intact.     Conjunctiva/sclera: Conjunctivae normal.  Pupils: Pupils are equal, round, and reactive to light.  Cardiovascular:     Rate and Rhythm: Normal rate and regular rhythm.     Pulses: Normal pulses.     Heart sounds: Normal heart sounds. No murmur heard. Pulmonary:     Effort: Pulmonary effort is normal. No respiratory distress.     Breath sounds: Normal breath sounds. No wheezing or rales.  Abdominal:     General: There is no distension.     Palpations: Abdomen is soft.     Tenderness: There is no abdominal tenderness. There is no guarding or rebound.   Musculoskeletal:     Cervical back: Normal range of motion. Tenderness present.  Lymphadenopathy:     Cervical: Cervical adenopathy present.  Skin:    General: Skin is warm and dry.     Capillary Refill: Capillary refill takes less than 2 seconds.     Findings: No rash.  Neurological:     Mental Status: She is alert and oriented to person, place, and time.  Psychiatric:        Behavior: Behavior normal.       No results found for any visits on 12/28/21.  Assessment & Plan     1. Viral URI  Increase fluids.  Rest.  Saline nasal spray.  Mucinex as directed.  Humidifier in bedroom. Flonase per orders.    The patient was advised to call back or seek an in-person evaluation if the symptoms worsen or if the condition fails to improve as anticipated.  I discussed the assessment and treatment plan with the patient. The patient was provided an opportunity to ask questions and all were answered. The patient agreed with the plan and demonstrated an understanding of the instructions.  The entirety of the information documented in the History of Present Illness, Review of Systems and Physical Exam were personally obtained by me. Portions of this information were initially documented by the CMA and reviewed by me for thoroughness and accuracy.  Portions of this note were created using dictation software and may contain typographical errors.     Mardene Speak, PA-C  [facility] [phone] (phone) [phone] (fax)  [place]

## 2022-01-18 ENCOUNTER — Ambulatory Visit: Payer: BC Managed Care – PPO | Admitting: Dietician

## 2022-01-23 ENCOUNTER — Telehealth: Payer: Self-pay | Admitting: Obstetrics and Gynecology

## 2022-01-23 NOTE — Telephone Encounter (Signed)
Patient is scheduling for 02/15/22 with Dr. Rebekah Chesterfield spotting.. Patient  state she is in menopause and wants to know if this is normal and if she can wait until then. Please advise?

## 2022-01-23 NOTE — Telephone Encounter (Signed)
I called and spoke with patient about cancellation with [facility] on 01/26/22 at 1:30pm. Patient declined stating she is going to a wedding that day and is unavailable. Patient has been added to cancellation list.

## 2022-01-25 NOTE — Telephone Encounter (Signed)
Spotting

## 2022-01-26 ENCOUNTER — Encounter: Payer: Self-pay | Admitting: Certified Nurse Midwife

## 2022-01-26 ENCOUNTER — Other Ambulatory Visit (HOSPITAL_COMMUNITY)
Admission: RE | Admit: 2022-01-26 | Discharge: 2022-01-26 | Disposition: A | Discharge status: Home / Self Care | Payer: BC Managed Care – PPO | Source: Ambulatory Visit | Attending: Obstetrics and Gynecology | Admitting: Obstetrics and Gynecology

## 2022-01-26 ENCOUNTER — Ambulatory Visit (INDEPENDENT_AMBULATORY_CARE_PROVIDER_SITE_OTHER): Payer: BC Managed Care – PPO | Admitting: Certified Nurse Midwife

## 2022-01-26 VITALS — BP 124/78 | HR 50 | Resp 16 | Wt 202.4 lb

## 2022-01-26 DIAGNOSIS — N950 Postmenopausal bleeding: Secondary | ICD-10-CM

## 2022-01-26 DIAGNOSIS — N939 Abnormal uterine and vaginal bleeding, unspecified: Secondary | ICD-10-CM | POA: Diagnosis present

## 2022-01-26 DIAGNOSIS — R102 Pelvic and perineal pain: Secondary | ICD-10-CM | POA: Diagnosis not present

## 2022-01-26 DIAGNOSIS — Z780 Asymptomatic menopausal state: Secondary | ICD-10-CM | POA: Insufficient documentation

## 2022-01-26 LAB — POCT URINALYSIS DIPSTICK
Bilirubin, UA: NEGATIVE
Glucose, UA: NEGATIVE
Ketones, UA: NEGATIVE
Nitrite, UA: NEGATIVE
Protein, UA: NEGATIVE
Spec Grav, UA: 1.015 (ref 1.010–1.025)
Urobilinogen, UA: 0.2 E.U./dL
pH, UA: 7.5 (ref 5.0–8.0)

## 2022-01-26 NOTE — Progress Notes (Signed)
GYN ENCOUNTER NOTE  Subjective:       Cassidy Kelly is a 51 y.o. G59P1011 female is here for gynecologic evaluation of the following issues:  1. Post menopausal spotting .  Notice spotting about 3 wks ago, then it progressed to using a panty liner, it was light pink in color. She has also had so bladder pressure and cramping.    Gynecologic History Patient's last menstrual period was 08/01/2017. Contraception: post menopausal status Last Pap: 09/23/2018. Results were: normal/ negative HPV  Pt has a new partner , she had not been sexually active for some time.   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 90 tablet 1   buPROPion (WELLBUTRIN XL) 150 MG 24 hr tablet Take 1 tablet (150 mg total) by mouth daily. 90 tablet 3   cholecalciferol (VITAMIN D3) 25 MCG (1000 UNIT) tablet Take 1,000 Units by mouth daily.     FLUoxetine (PROZAC) 10 MG capsule Take 1 capsule (10 mg total) by mouth daily. 90 capsule 3   losartan-hydrochlorothiazide (HYZAAR) 100-25 MG tablet Take 1 tablet by mouth daily. 90 tablet 1   metaxalone (SKELAXIN) 800 MG tablet Take 1 tablet (800 mg total) by  mouth 3 (three) times daily as needed for muscle spasms. 90 tablet 0   Multiple Vitamin (MULTIVITAMIN) tablet Take 1 tablet by mouth daily.     scopolamine (TRANSDERM-SCOP) 1 MG/3DAYS Place 1 patch (1.5 mg total) onto the skin every 3 (three) days. 10 patch 12   TURMERIC PO Take by mouth.     No current facility-administered medications on file prior to visit.    Allergies  Allergen Reactions   Other     Cats   Pollen Extract     Social History   Socioeconomic History   Marital status: Divorced    Spouse name: Not on file   Number of children: 1   Years of education: 16   Highest education level: Not on file  Occupational History   Occupation: Chief Technology Officer  Tobacco Use   Smoking status: Never   Smokeless tobacco: Never  Vaping Use   Vaping Use: Never used  Substance and Sexual Activity   Alcohol use: Yes    Comment: OCC   Drug use: No   Sexual activity: Yes    Partners: Male    Comment: DEPO  Other Topics Concern   Not on file  Social History Narrative   Not on file   Social Determinants of Health   Financial Resource Strain: Not on file  Food Insecurity: No Food Insecurity (08/04/2021)   Hunger Vital Sign    Worried About Running Out of Food in the Last Year: Never true    Ran Out of Food in the Last Year: Never true  Transportation Needs: No Transportation Needs (08/04/2021)   PRAPARE - Hydrologist (Medical): No    Lack of Transportation (Non-Medical): No  Physical Activity: Sufficiently Active (09/23/2018)   Exercise Vital Sign    Days of Exercise per Week: 5 days    Minutes of Exercise per Session: 60 min  Stress: Stress Concern Present (07/17/2017)   [place]    Feeling of Stress : Rather much  Social Connections: Not on file  Intimate Partner Violence: Not on file    Family History  Problem Relation Age of Onset   Healthy Mother    Diabetes  Maternal Grandmother    Heart disease Paternal Grandfather        MIs in 62s    The following portions of the patient's history were reviewed and updated as appropriate: allergies, current medications, past family history, past medical history, past social history, past surgical history and problem list.  Review of Systems Review of Systems - Negative except as mentioned in HPI Review of Systems - General ROS: negative for - chills, fatigue, fever, hot flashes, malaise or night sweats Hematological and Lymphatic ROS: negative for - bleeding problems or swollen lymph nodes Gastrointestinal ROS: negative for - abdominal pain, blood in stools, change in bowel habits and nausea/vomiting Musculoskeletal ROS: negative for - joint pain, muscle pain or muscular weakness Genito-Urinary ROS: negative for - change in menstrual cycle, dysmenorrhea, dyspareunia, dysuria, genital discharge, genital ulcers, hematuria, incontinence, irregular/heavy menses, nocturia or pelvic pain. Postmenopausal vaginal spotting for several weeks  Objective:   BP 124/78   Pulse (!) 50   Resp 16   Wt 202 lb 6.4 oz (91.8 kg)   LMP 08/01/2017   BMI 37.02 kg/m  CONSTITUTIONAL: Well-developed, well-nourished female in no acute distress.  HENT:  Normocephalic, atraumatic.  NECK: Normal range of motion, supple, no masses.  Normal thyroid.  SKIN: Skin is warm and dry. No rash noted. Not diaphoretic. No erythema. No pallor. [place]: Alert and oriented to person, place, and time. PSYCHIATRIC: Normal mood and affect. Normal behavior. Normal judgment and thought content. CARDIOVASCULAR:Not Examined RESPIRATORY: Not Examined BREASTS: Not Examined ABDOMEN: Soft, non distended; Non tender.  No Organomegaly. PELVIC:  External Genitalia: Normal  BUS: Normal  Vagina: Normal, thin white discharge , no odor   Cervix: Normal, pink, no blood , no polyps seen   Uterus: Normal size, shape,consistency, mobile  Adnexa: Normal  RV:  Normal   Bladder: Nontender MUSCULOSKELETAL: Normal range of motion. No tenderness.  No cyanosis, clubbing, or edema.   Assessment:   1. Pelvic pressure in female - POCT urinalysis dipstick  Postmenopausal bleeding.      Plan:   Discussed possible benign cause of vaginal bleeding including infection, micro abrasions with intercourse, cervical bleeding with intercourse. Swab collected. Urine culture due to bladder pressure. f all is negative will do U/s for evaluation of endometrium. She verbalizes and agrees to plan of care.   Will follow up with results.   Philip Aspen, CNM

## 2022-01-26 NOTE — Addendum Note (Signed)
Addended by: Minette Headland on: 01/26/2022 11:12 AM   Modules accepted: Orders

## 2022-01-27 LAB — CERVICOVAGINAL ANCILLARY ONLY
Bacterial Vaginitis (gardnerella): POSITIVE — AB
Candida Glabrata: NEGATIVE
Candida Vaginitis: NEGATIVE
Chlamydia: NEGATIVE
Comment: NEGATIVE
Comment: NEGATIVE
Comment: NEGATIVE
Comment: NEGATIVE
Comment: NEGATIVE
Comment: NORMAL
Neisseria Gonorrhea: NEGATIVE
Trichomonas: NEGATIVE

## 2022-01-29 ENCOUNTER — Encounter: Payer: Self-pay | Admitting: Certified Nurse Midwife

## 2022-01-29 ENCOUNTER — Other Ambulatory Visit: Payer: Self-pay | Admitting: Certified Nurse Midwife

## 2022-01-29 MED ORDER — METRONIDAZOLE 500 MG PO TABS: 500.0000 mg | ORAL_TABLET | Freq: Two times a day (BID) | ORAL | 0 refills | Status: AC

## 2022-01-31 ENCOUNTER — Other Ambulatory Visit: Payer: Self-pay | Admitting: Certified Nurse Midwife

## 2022-01-31 ENCOUNTER — Encounter: Payer: Self-pay | Admitting: Certified Nurse Midwife

## 2022-01-31 LAB — URINE CULTURE

## 2022-01-31 MED ORDER — NITROFURANTOIN MONOHYD MACRO 100 MG PO CAPS: 100.0000 mg | ORAL_CAPSULE | Freq: Two times a day (BID) | ORAL | 0 refills | Status: AC

## 2022-02-15 ENCOUNTER — Encounter: Payer: BC Managed Care – PPO | Admitting: Obstetrics and Gynecology

## 2022-03-01 ENCOUNTER — Other Ambulatory Visit: Payer: Self-pay | Admitting: Podiatry

## 2022-03-09 ENCOUNTER — Other Ambulatory Visit: Payer: Self-pay | Admitting: Family Medicine

## 2022-03-09 DIAGNOSIS — I10 Essential (primary) hypertension: Secondary | ICD-10-CM

## 2022-03-09 NOTE — Telephone Encounter (Signed)
Requested medication (s) are due for refill today: yes  Requested medication (s) are on the active medication list: yes  Last refill:  hyzaar- 09/06/21 #90 1 refill, norvasc-09/06/21 #90 1 refills  Future visit scheduled: yes in 1 month  Notes to clinic:  protocol failed. Last labs 04/29/21 01/26/22 Pulse 50. Do you want to refill Rxs?     Requested Prescriptions  Pending Prescriptions Disp Refills   losartan-hydrochlorothiazide (HYZAAR) 100-25 MG tablet [Pharmacy Med Name: LOSARTAN/HCTZ 100/25MG TABLETS] 90 tablet 1    Sig: Take 1 tablet by mouth daily.     Cardiovascular: ARB + Diuretic Combos Failed - 03/09/2022  3:29 AM      Failed - K in normal range and within 180 days    Potassium  Date Value Ref Range Status  04/29/2021 4.1 3.5 - 5.2 mmol/L Final         Failed - Na in normal range and within 180 days    Sodium  Date Value Ref Range Status  04/29/2021 138 134 - 144 mmol/L Final         Failed - Cr in normal range and within 180 days    Creat  Date Value Ref Range Status  10/11/2015 0.94 0.50 - 1.10 mg/dL Final   Creatinine, Ser  Date Value Ref Range Status  04/29/2021 0.83 0.57 - 1.00 mg/dL Final         Failed - eGFR is 10 or above and within 180 days    GFR, Est African American  Date Value Ref Range Status  10/11/2015 84 >=60 mL/min Final   GFR calc Af Amer  Date Value Ref Range Status  03/15/2020 85 >59 mL/min/1.73 Final    Comment:    **In accordance with recommendations from the NKF-ASN Task force,**   [facility] is in the process of updating its eGFR calculation to the   2021 CKD-EPI creatinine equation that estimates kidney function   without a race variable.    GFR, Est Non African American  Date Value Ref Range Status  10/11/2015 73 >=60 mL/min Final   GFR calc non Af Amer  Date Value Ref Range Status  03/15/2020 74 >59 mL/min/1.73 Final   eGFR  Date Value Ref Range Status  04/29/2021 85 >59 mL/min/1.73 Final         Passed - Patient is  not pregnant      Passed - Last BP in normal range    BP Readings from Last 1 Encounters:  01/26/22 124/78         Passed - Valid encounter within last 6 months    Recent Outpatient Visits           2 months ago [place], [place], PA-C   4 months ago Viral URI   [place], [place], DO   4 months ago Depression, recurrent [facility])   [facility] Tally Joe T, FNP   6 months ago Depression, recurrent [facility])   [facility] Tally Joe T, FNP   7 months ago Depression, recurrent [facility])   [facility] Gwyneth Sprout, FNP       Future Appointments             In 1 month Gwyneth Sprout, [place], [facility]             amLODipine (NORVASC) 10 MG tablet [Pharmacy Med Name: AMLODIPINE BESYLATE 10MG TABLETS] 90 tablet 1  Sig: TAKE 1 TABLET(10 MG) BY MOUTH DAILY     Cardiovascular: Calcium Channel Blockers 2 Passed - 03/09/2022  3:29 AM      Passed - Last BP in normal range    BP Readings from Last 1 Encounters:  01/26/22 124/78         Passed - Last Heart Rate in normal range    Pulse Readings from Last 1 Encounters:  01/26/22 (!) 50         Passed - Valid encounter within last 6 months    Recent Outpatient Visits           2 months ago [place], [place], PA-C   4 months ago Viral URI   [facility] Myles Gip, DO   4 months ago Depression, recurrent [facility])   [facility] Tally Joe T, FNP   6 months ago Depression, recurrent [facility])   [facility] Tally Joe T, FNP   7 months ago Depression, recurrent [facility])   [facility] Gwyneth Sprout, FNP       Future Appointments             In 1 month Rollene Rotunda, Jaci Standard, [place], [facility]

## 2022-05-05 ENCOUNTER — Ambulatory Visit: Payer: BC Managed Care – PPO | Admitting: Family Medicine

## 2022-05-05 ENCOUNTER — Encounter: Payer: Self-pay | Admitting: Family Medicine

## 2022-05-05 VITALS — BP 110/64 | Temp 98.1°F | Ht 62.0 in | Wt 211.0 lb

## 2022-05-05 DIAGNOSIS — Z1231 Encounter for screening mammogram for malignant neoplasm of breast: Secondary | ICD-10-CM | POA: Diagnosis not present

## 2022-05-05 DIAGNOSIS — R7303 Prediabetes: Secondary | ICD-10-CM

## 2022-05-05 DIAGNOSIS — F339 Major depressive disorder, recurrent, unspecified: Secondary | ICD-10-CM | POA: Diagnosis not present

## 2022-05-05 DIAGNOSIS — I10 Essential (primary) hypertension: Secondary | ICD-10-CM

## 2022-05-05 DIAGNOSIS — E7800 Pure hypercholesterolemia, unspecified: Secondary | ICD-10-CM

## 2022-05-05 MED ORDER — FLUOXETINE HCL 10 MG PO CAPS: 10.0000 mg | ORAL_CAPSULE | Freq: Every day | ORAL | 3 refills | Status: DC

## 2022-05-05 NOTE — Patient Instructions (Signed)
Please call and schedule your mammogram:  [facility]  [place], [place],  [place]  [address] Get Driving Directions Main: [phone]  Sunday:Closed Monday:7:20 AM - 5:00 PM Tuesday:7:20 AM - 5:00 PM Wednesday:7:20 AM - 5:00 PM Thursday:7:20 AM - 5:00 PM Friday:7:20 AM - 4:30 PM Saturday:Closed

## 2022-05-05 NOTE — Assessment & Plan Note (Signed)
Chronic, well controlled Continue Norvasc 10 mg and Hyzaar 100-25

## 2022-05-05 NOTE — Assessment & Plan Note (Signed)
Chronic, previously stable Continue to recommend balanced, lower carb meals. Smaller meal size, adding snacks. Choosing water as drink of choice and increasing purposeful exercise. Repeat A1c

## 2022-05-05 NOTE — Assessment & Plan Note (Signed)
Due for screening for mammogram, denies breast concerns, provided with phone number to call and schedule appointment for mammogram. Encouraged to repeat breast cancer screening every 1-2 years.  

## 2022-05-05 NOTE — Assessment & Plan Note (Signed)
Chronic, previously elevated >170 LDL Repeat FLP recommend diet low in saturated fat and regular exercise - 30 min at least 5 times per week

## 2022-05-05 NOTE — Progress Notes (Signed)
Established patient visit   Patient: Cassidy Kelly   DOB: [birth year]   52 y.o. Female  MRN: [id number] Visit Date: 05/05/2022  Today's healthcare provider: Gwyneth Sprout, FNP  Introduced to nurse practitioner role and practice setting.  All questions answered.  Discussed provider/patient relationship and expectations.  Subjective    HPI HPI   Follow-up--doing much better. Last edited by Elta Guadeloupe, CMA on 05/05/2022  3:37 PM.      Medications: Outpatient Medications Prior to Visit  Medication Sig   amLODipine (NORVASC) 10 MG tablet TAKE 1 TABLET(10 MG) BY MOUTH DAILY   buPROPion (WELLBUTRIN XL) 150 MG 24 hr tablet Take 1 tablet (150 mg total) by mouth daily.   cholecalciferol (VITAMIN D3) 25 MCG (1000 UNIT) tablet Take 1,000 Units by mouth daily.   losartan-hydrochlorothiazide (HYZAAR) 100-25 MG tablet TAKE 1 TABLET BY MOUTH DAILY   Multiple Vitamin (MULTIVITAMIN) tablet Take 1 tablet by mouth daily.   scopolamine (TRANSDERM-SCOP) 1 MG/3DAYS Place 1 patch (1.5 mg total) onto the skin every 3 (three) days.   TURMERIC PO Take by mouth.   [DISCONTINUED] FLUoxetine (PROZAC) 10 MG capsule Take 1 capsule (10 mg total) by mouth daily.   [DISCONTINUED] metaxalone (SKELAXIN) 800 MG tablet Take 1 tablet (800 mg total) by mouth 3 (three) times daily as needed for muscle spasms.   No facility-administered medications prior to visit.    Review of Systems  Last CBC Lab Results  Component Value Date   WBC 9.8 07/19/2021   HGB 12.2 07/19/2021   HCT 35.9 07/19/2021   MCV 88 07/19/2021   MCH 29.8 07/19/2021   RDW 13.2 07/19/2021   PLT 235 0000000   Last metabolic panel Lab Results  Component Value Date   GLUCOSE 117 (H) 04/29/2021   NA 138 04/29/2021   K 4.1 04/29/2021   CL 102 04/29/2021   CO2 23 04/29/2021   BUN 12 04/29/2021   CREATININE 0.83 04/29/2021   EGFR 85 04/29/2021   CALCIUM 9.5 04/29/2021   PROT 7.0 04/29/2021   ALBUMIN 4.6 04/29/2021   LABGLOB 2.4  04/29/2021   AGRATIO 1.9 04/29/2021   BILITOT 0.3 04/29/2021   ALKPHOS 134 (H) 04/29/2021   AST 28 04/29/2021   ALT 42 (H) 04/29/2021   ANIONGAP 10 06/14/2017   Last lipids Lab Results  Component Value Date   CHOL 261 (H) 04/29/2021   HDL 73 04/29/2021   LDLCALC 174 (H) 04/29/2021   TRIG 83 04/29/2021   CHOLHDL 3.6 04/29/2021   Last hemoglobin A1c Lab Results  Component Value Date   HGBA1C 6.2 (H) 04/29/2021   Last thyroid functions Lab Results  Component Value Date   TSH 2.590 04/29/2021       Objective    BP 110/64 (BP Location: Right Arm, Patient Position: Sitting, Cuff Size: Large)   Temp 98.1 F (36.7 C)   Ht 5' 2"$  (1.575 m)   Wt 211 lb (95.7 kg)   LMP 08/01/2017   SpO2 99%   BMI 38.59 kg/m   BP Readings from Last 3 Encounters:  05/05/22 110/64  01/26/22 124/78  12/28/21 134/70   Wt Readings from Last 3 Encounters:  05/05/22 211 lb (95.7 kg)  01/26/22 202 lb 6.4 oz (91.8 kg)  11/07/21 215 lb 8 oz (97.8 kg)   SpO2 Readings from Last 3 Encounters:  05/05/22 99%  12/28/21 100%  11/07/21 97%   Physical Exam Vitals and nursing note reviewed.  Constitutional:  General: She is not in acute distress.    Appearance: Normal appearance. She is obese. She is not ill-appearing, toxic-appearing or diaphoretic.  HENT:     Head: Normocephalic and atraumatic.  Cardiovascular:     Rate and Rhythm: Normal rate and regular rhythm.     Pulses: Normal pulses.     Heart sounds: Normal heart sounds. No murmur heard.    No friction rub. No gallop.  Pulmonary:     Effort: Pulmonary effort is normal. No respiratory distress.     Breath sounds: Normal breath sounds. No stridor. No wheezing, rhonchi or rales.  Chest:     Chest wall: No tenderness.  Musculoskeletal:        General: No swelling, tenderness, deformity or signs of injury. Normal range of motion.     Right lower leg: No edema.     Left lower leg: No edema.  Skin:    General: Skin is warm and dry.      Capillary Refill: Capillary refill takes less than 2 seconds.     Coloration: Skin is not jaundiced or pale.     Findings: No bruising, erythema, lesion or rash.  Neurological:     General: No focal deficit present.     Mental Status: She is alert and oriented to person, place, and time. Mental status is at baseline.     Cranial Nerves: No cranial nerve deficit.     Sensory: No sensory deficit.     Motor: No weakness.     Coordination: Coordination normal.  Psychiatric:        Mood and Affect: Mood normal.        Behavior: Behavior normal.        Thought Content: Thought content normal.        Judgment: Judgment normal.     No results found for any visits on 05/05/22.  Assessment & Plan     Problem List Items Addressed This Visit       Cardiovascular and Mediastinum   Essential hypertension    Chronic, well controlled Continue Norvasc 10 mg and Hyzaar 100-25      Relevant Orders   CBC with Differential/Platelet   Comprehensive Metabolic Panel (CMET)   TSH + free T4     Other   Depression, recurrent (HCC)   Relevant Medications   FLUoxetine (PROZAC) 10 MG capsule   Hypercholesterolemia    Chronic, previously elevated >170 LDL Repeat FLP recommend diet low in saturated fat and regular exercise - 30 min at least 5 times per week       Relevant Orders   Lipid panel   Morbid obesity (HCC)    Chronic, stable Body mass index is 38.59 kg/m. Discussed importance of healthy weight management Discussed diet and exercise Associated with HTN, depression       Pre-diabetes    Chronic, previously stable Continue to recommend balanced, lower carb meals. Smaller meal size, adding snacks. Choosing water as drink of choice and increasing purposeful exercise. Repeat A1c      Relevant Orders   Hemoglobin A1c   Screening mammogram for breast cancer - Primary    Due for screening for mammogram, denies breast concerns, provided with phone number to call and schedule  appointment for mammogram. Encouraged to repeat breast cancer screening every 1-2 years.       Relevant Orders   MM 3D SCREEN BREAST BILATERAL   Return in about 6 months (around 11/03/2022) for annual examination.  Vonna Kotyk, FNP, have reviewed all documentation for this visit. The documentation on 05/05/22 for the exam, diagnosis, procedures, and orders are all accurate and complete.  Gwyneth Sprout, [place] [phone] (phone) [phone] (fax)  [place]

## 2022-05-05 NOTE — Assessment & Plan Note (Signed)
Chronic, stable Body mass index is 38.59 kg/m. Discussed importance of healthy weight management Discussed diet and exercise Associated with HTN, depression

## 2022-05-09 LAB — LIPID PANEL
Chol/HDL Ratio: 3.9 ratio (ref 0.0–4.4)
Cholesterol, Total: 234 mg/dL — ABNORMAL HIGH (ref 100–199)
HDL: 60 mg/dL (ref >39)
LDL Chol Calc (NIH): 155 mg/dL — ABNORMAL HIGH (ref 0–99)
Triglycerides: 106 mg/dL (ref 0–149)
VLDL Cholesterol Cal: 19 mg/dL (ref 5–40)

## 2022-05-09 LAB — CBC WITH DIFFERENTIAL/PLATELET
Basophils Absolute: 0 10*3/uL (ref 0.0–0.2)
Basos: 1 %
EOS (ABSOLUTE): 0.2 10*3/uL (ref 0.0–0.4)
Eos: 2 %
Hematocrit: 32.9 % — ABNORMAL LOW (ref 34.0–46.6)
Hemoglobin: 11 g/dL — ABNORMAL LOW (ref 11.1–15.9)
Immature Grans (Abs): 0 10*3/uL (ref 0.0–0.1)
Immature Granulocytes: 0 %
Lymphocytes Absolute: 2.9 10*3/uL (ref 0.7–3.1)
Lymphs: 37 %
MCH: 30.3 pg (ref 26.6–33.0)
MCHC: 33.4 g/dL (ref 31.5–35.7)
MCV: 91 fL (ref 79–97)
Monocytes Absolute: 0.5 10*3/uL (ref 0.1–0.9)
Monocytes: 7 %
Neutrophils Absolute: 4.3 10*3/uL (ref 1.4–7.0)
Neutrophils: 53 %
Platelets: 202 10*3/uL (ref 150–450)
RBC: 3.63 x10E6/uL — ABNORMAL LOW (ref 3.77–5.28)
RDW: 12.6 % (ref 11.7–15.4)
WBC: 7.8 10*3/uL (ref 3.4–10.8)

## 2022-05-09 LAB — TSH+FREE T4
Free T4: 1 ng/dL (ref 0.82–1.77)
TSH: 2.62 u[IU]/mL (ref 0.450–4.500)

## 2022-05-09 LAB — COMPREHENSIVE METABOLIC PANEL
ALT: 17 IU/L (ref 0–32)
AST: 17 IU/L (ref 0–40)
Albumin/Globulin Ratio: 1.8 (ref 1.2–2.2)
Albumin: 4.4 g/dL (ref 3.8–4.9)
Alkaline Phosphatase: 114 IU/L (ref 44–121)
BUN/Creatinine Ratio: 16 (ref 9–23)
BUN: 14 mg/dL (ref 6–24)
Bilirubin Total: <0.2 mg/dL (ref 0.0–1.2)
CO2: 24 mmol/L (ref 20–29)
Calcium: 9 mg/dL (ref 8.7–10.2)
Chloride: 104 mmol/L (ref 96–106)
Creatinine, Ser: 0.88 mg/dL (ref 0.57–1.00)
Globulin, Total: 2.4 g/dL (ref 1.5–4.5)
Glucose: 107 mg/dL — ABNORMAL HIGH (ref 70–99)
Potassium: 4.1 mmol/L (ref 3.5–5.2)
Sodium: 142 mmol/L (ref 134–144)
Total Protein: 6.8 g/dL (ref 6.0–8.5)
eGFR: 79 mL/min/{1.73_m2} (ref >59)

## 2022-05-09 LAB — HEMOGLOBIN A1C
Est. average glucose Bld gHb Est-mCnc: 131 mg/dL
Hgb A1c MFr Bld: 6.2 % — ABNORMAL HIGH (ref 4.8–5.6)

## 2022-05-09 NOTE — Progress Notes (Signed)
Borderline low hemoglobin; continue to ensure iron rich diet with greens, legumes and animal products if desired.  Cholesterol is improved; however, remains above target total of <200 and LDL of <100. I continue to recommend diet low in saturated fat and regular exercise - 30 min at least 5 times per week. Stroke and MI risk remains stable at 3% estimate in 10 years. The 10-year ASCVD risk score (Arnett DK, et al., 2019) is: 2.6%   Values used to calculate the score:     Age: 22 years     Sex: Female     Is Non-Hispanic African American: Yes     Diabetic: No     Tobacco smoker: No     Systolic Blood Pressure: [id number] mmHg     Is BP treated: Yes     HDL Cholesterol: 60 mg/dL     Total Cholesterol: 234 mg/dL  Pre diabetes remains stable; Continue to recommend balanced, lower carb meals. Smaller meal size, adding snacks. Choosing water as drink of choice and increasing purposeful exercise.  Normal thyroid.  Please call and schedule mammogram.  Take care, Gwyneth Sprout, [address] [place], [place] [address] [phone] (phone) [phone] (fax) [place]

## 2022-05-10 LAB — HM MAMMOGRAPHY

## 2022-06-02 ENCOUNTER — Encounter: Payer: Self-pay | Admitting: Family Medicine

## 2022-06-06 ENCOUNTER — Encounter: Payer: Self-pay | Admitting: Family Medicine

## 2022-06-15 ENCOUNTER — Other Ambulatory Visit: Payer: Self-pay | Admitting: Family Medicine

## 2022-06-15 DIAGNOSIS — I10 Essential (primary) hypertension: Secondary | ICD-10-CM

## 2022-06-15 NOTE — Telephone Encounter (Signed)
Unable to refill per protocol, Rx request is too soon. Last refill 03/09/22 for 90 days and 1 refill.  Requested Prescriptions  Pending Prescriptions Disp Refills   amLODipine (NORVASC) 10 MG tablet [Pharmacy Med Name: AMLODIPINE BESYLATE 10MG  TABLETS] 90 tablet 1    Sig: TAKE 1 TABLET(10 MG) BY MOUTH DAILY     Cardiovascular: Calcium Channel Blockers 2 Passed - 06/15/2022  8:00 AM      Passed - Last BP in normal range    BP Readings from Last 1 Encounters:  05/05/22 110/64         Passed - Last Heart Rate in normal range    Pulse Readings from Last 1 Encounters:  01/26/22 (!) 50         Passed - Valid encounter within last 6 months    Recent Outpatient Visits           1 month ago Screening mammogram for breast cancer   [place] Tally Joe T, FNP   5 months ago Viral URI   [place], [place], PA-C   7 months ago Viral URI   [place] Rory Percy M, DO   7 months ago Depression, recurrent [facility])   [place] Tally Joe T, FNP   9 months ago Depression, recurrent [facility])   [place] Tally Joe T, FNP       Future Appointments             In 4 months Gwyneth Sprout, [place], [facility]             losartan-hydrochlorothiazide (HYZAAR) 100-25 MG tablet [Pharmacy Med Name: LOSARTAN/HCTZ 100/25MG  TABLETS] 90 tablet 1    Sig: TAKE 1 TABLET BY MOUTH DAILY     Cardiovascular: ARB + Diuretic Combos Passed - 06/15/2022  8:00 AM      Passed - K in normal range and within 180 days    Potassium  Date Value Ref Range Status  05/08/2022 4.1 3.5 - 5.2 mmol/L Final         Passed - Na in normal range and within 180 days    Sodium  Date Value Ref Range Status  05/08/2022 142 134 - 144 mmol/L Final         Passed - Cr in normal range and within 180 days    Creat  Date Value Ref Range Status   10/11/2015 0.94 0.50 - 1.10 mg/dL Final   Creatinine, Ser  Date Value Ref Range Status  05/08/2022 0.88 0.57 - 1.00 mg/dL Final         Passed - eGFR is 10 or above and within 180 days    GFR, Est African American  Date Value Ref Range Status  10/11/2015 84 >=60 mL/min Final   GFR calc Af Amer  Date Value Ref Range Status  03/15/2020 85 >59 mL/min/1.73 Final    Comment:    **In accordance with recommendations from the NKF-ASN Task force,**   [facility] is in the process of updating its eGFR calculation to the   2021 CKD-EPI creatinine equation that estimates kidney function   without a race variable.    GFR, Est Non African American  Date Value Ref Range Status  10/11/2015 73 >=60 mL/min Final   GFR calc non Af Amer  Date Value Ref Range Status  03/15/2020 74 >59 mL/min/1.73 Final   eGFR  Date Value Ref Range Status  05/08/2022 79 >59 mL/min/1.73 Final         Passed - Patient is not pregnant      Passed - Last BP in normal range    BP Readings from Last 1 Encounters:  05/05/22 110/64         Passed - Valid encounter within last 6 months    Recent Outpatient Visits           1 month ago Screening mammogram for breast cancer   [place] Gwyneth Sprout, FNP   5 months ago Viral URI   [place], [place], PA-C   7 months ago Viral URI   [place], DO   7 months ago Depression, recurrent [facility])   [place] Tally Joe T, FNP   9 months ago Depression, recurrent [facility])   [place] Gwyneth Sprout, FNP       Future Appointments             In 4 months Gwyneth Sprout, [place], [facility]

## 2022-06-28 ENCOUNTER — Ambulatory Visit: Payer: BC Managed Care – PPO | Admitting: Family Medicine

## 2022-06-28 ENCOUNTER — Encounter: Payer: Self-pay | Admitting: Family Medicine

## 2022-06-28 VITALS — BP 104/71 | HR 64 | Temp 97.9°F | Resp 12 | Wt 210.0 lb

## 2022-06-28 DIAGNOSIS — F339 Major depressive disorder, recurrent, unspecified: Secondary | ICD-10-CM

## 2022-06-28 DIAGNOSIS — R0683 Snoring: Secondary | ICD-10-CM | POA: Insufficient documentation

## 2022-06-28 DIAGNOSIS — R5383 Other fatigue: Secondary | ICD-10-CM | POA: Diagnosis not present

## 2022-06-28 DIAGNOSIS — I10 Essential (primary) hypertension: Secondary | ICD-10-CM

## 2022-06-28 DIAGNOSIS — R7303 Prediabetes: Secondary | ICD-10-CM | POA: Diagnosis not present

## 2022-06-28 MED ORDER — AMLODIPINE BESYLATE 10 MG PO TABS: 5.0000 mg | ORAL_TABLET | Freq: Every day | ORAL | 1 refills | Status: DC

## 2022-06-28 NOTE — Assessment & Plan Note (Signed)
Chronic, unknown Previous control with diet and exercise; has re-established with her trainer Continue to recommend balanced, lower carb meals. Smaller meal size, adding snacks. Choosing water as drink of choice and increasing purposeful exercise.

## 2022-06-28 NOTE — Assessment & Plan Note (Signed)
Chronic, reports frequent restless nights and dry mouth in addition to mood concerns and fatigue with desire to nap every morning Recommend PSG to assist with concern for OSA

## 2022-06-28 NOTE — Assessment & Plan Note (Signed)
Chronic, stable Reports she is in a "good place" Continue wellbutrin at 150 mg and prozac at 10 mg

## 2022-06-28 NOTE — Assessment & Plan Note (Signed)
Chronic, stable Discussed importance of healthy weight management Discussed diet and exercise Body mass index is 38.41 kg/m.

## 2022-06-28 NOTE — Assessment & Plan Note (Signed)
Chronic; stable Pt endorses dizziness; recommend 1/2 dose of norvasc with further bp management

## 2022-06-28 NOTE — Progress Notes (Signed)
I,Sulibeya S Dimas,acting as a Education administrator for Gwyneth Sprout, FNP.,have documented all relevant documentation on the behalf of Gwyneth Sprout, FNP,as directed by  Gwyneth Sprout, FNP while in the presence of Gwyneth Sprout, FNP.   Established patient visit  Patient: Cassidy Kelly   DOB: May 12, 1970   52 y.o. Female  MRN: [id number] Visit Date: 06/28/2022  Today's healthcare provider: Gwyneth Sprout, FNP  Re Introduced to nurse practitioner role and practice setting.  All questions answered.  Discussed provider/patient relationship and expectations.  Subjective    HPI  Patient reports dizziness and low energy x a couple of months. She reports symptoms are worsening in the last month. She reports forgetfulness. She is concerned that symptoms are cause by fluoxetine and bupropion.   Medications: Outpatient Medications Prior to Visit  Medication Sig   buPROPion (WELLBUTRIN XL) 150 MG 24 hr tablet Take 1 tablet (150 mg total) by mouth daily.   cholecalciferol (VITAMIN D3) 25 MCG (1000 UNIT) tablet Take 1,000 Units by mouth daily.   FLUoxetine (PROZAC) 10 MG capsule Take 1 capsule (10 mg total) by mouth daily.   losartan-hydrochlorothiazide (HYZAAR) 100-25 MG tablet TAKE 1 TABLET BY MOUTH DAILY   Multiple Vitamin (MULTIVITAMIN) tablet Take 1 tablet by mouth daily.   scopolamine (TRANSDERM-SCOP) 1 MG/3DAYS Place 1 patch (1.5 mg total) onto the skin every 3 (three) days.   TURMERIC PO Take by mouth.   [DISCONTINUED] amLODipine (NORVASC) 10 MG tablet TAKE 1 TABLET(10 MG) BY MOUTH DAILY   No facility-administered medications prior to visit.    Review of Systems  Constitutional:  Positive for activity change and fatigue.  Neurological:  Positive for dizziness.  Psychiatric/Behavioral:  Positive for decreased concentration.      Objective    BP 104/71 (BP Location: Left Arm, Patient Position: Sitting, Cuff Size: Large)   Pulse 64   Temp 97.9 F (36.6 C) (Temporal)   Resp 12   Wt 210 lb  (95.3 kg)   LMP 08/01/2017   SpO2 99%   BMI 38.41 kg/m   Physical Exam Vitals and nursing note reviewed.  Constitutional:      General: She is not in acute distress.    Appearance: Normal appearance. She is obese. She is not ill-appearing, toxic-appearing or diaphoretic.  HENT:     Head: Normocephalic and atraumatic.  Cardiovascular:     Rate and Rhythm: Normal rate and regular rhythm.     Pulses: Normal pulses.     Heart sounds: Normal heart sounds. No murmur heard.    No friction rub. No gallop.  Pulmonary:     Effort: Pulmonary effort is normal. No respiratory distress.     Breath sounds: Normal breath sounds. No stridor. No wheezing, rhonchi or rales.  Chest:     Chest wall: No tenderness.  Abdominal:     General: Bowel sounds are normal.     Palpations: Abdomen is soft.  Musculoskeletal:        General: No swelling, tenderness, deformity or signs of injury. Normal range of motion.     Right lower leg: No edema.     Left lower leg: No edema.  Skin:    General: Skin is warm and dry.     Capillary Refill: Capillary refill takes less than 2 seconds.     Coloration: Skin is not jaundiced or pale.     Findings: No bruising, erythema, lesion or rash.  Neurological:     General: No focal  deficit present.     Mental Status: She is alert and oriented to person, place, and time. Mental status is at baseline.     Cranial Nerves: No cranial nerve deficit.     Sensory: No sensory deficit.     Motor: No weakness.     Coordination: Coordination normal.  Psychiatric:        Mood and Affect: Mood normal.        Behavior: Behavior normal.        Thought Content: Thought content normal.        Judgment: Judgment normal.    No results found for any visits on 06/28/22.  Assessment & Plan     Problem List Items Addressed This Visit       Cardiovascular and Mediastinum   Essential hypertension    Chronic; stable Pt endorses dizziness; recommend 1/2 dose of norvasc with further  bp management      Relevant Medications   amLODipine (NORVASC) 10 MG tablet     Other   Depression, recurrent    Chronic, stable Reports she is in a "good place" Continue wellbutrin at 150 mg and prozac at 10 mg       Relevant Orders   PSG Sleep Study   Fatigue - Primary    Acute on chronic, defer labs for 3 month check to repeat A1c Recommend sleep study to assist with ongoing fatigue in setting of morbid obesity      Relevant Orders   PSG Sleep Study   Morbid obesity    Chronic, stable Discussed importance of healthy weight management Discussed diet and exercise Body mass index is 38.41 kg/m.       Relevant Orders   PSG Sleep Study   Pre-diabetes    Chronic, unknown Previous control with diet and exercise; has re-established with her trainer Continue to recommend balanced, lower carb meals. Smaller meal size, adding snacks. Choosing water as drink of choice and increasing purposeful exercise.       Relevant Orders   PSG Sleep Study   Snoring    Chronic, reports frequent restless nights and dry mouth in addition to mood concerns and fatigue with desire to nap every morning Recommend PSG to assist with concern for OSA      Relevant Orders   PSG Sleep Study   Return in about 6 weeks (around 08/09/2022) for chonic disease management.     Vonna Kotyk, FNP, have reviewed all documentation for this visit. The documentation on 06/28/22 for the exam, diagnosis, procedures, and orders are all accurate and complete.  Gwyneth Sprout, [place] [phone] (phone) [phone] (fax)  [place]

## 2022-06-28 NOTE — Assessment & Plan Note (Signed)
Acute on chronic, defer labs for 3 month check to repeat A1c Recommend sleep study to assist with ongoing fatigue in setting of morbid obesity

## 2022-06-28 NOTE — Patient Instructions (Addendum)
LABS AFTER 08/07/2022  LET us KNOW IF YOU DON'T HEAR FROM SLEEP STUDY  HALVE NORVASC to 5 MG; CONTINUE TO MONITOR BP.

## 2022-07-17 ENCOUNTER — Encounter: Payer: Self-pay | Admitting: Family Medicine

## 2022-07-18 NOTE — Telephone Encounter (Signed)
Sent a message to referral coordinator.

## 2022-07-19 ENCOUNTER — Other Ambulatory Visit: Payer: Self-pay

## 2022-07-19 DIAGNOSIS — R0683 Snoring: Secondary | ICD-10-CM

## 2022-07-19 DIAGNOSIS — R5383 Other fatigue: Secondary | ICD-10-CM

## 2022-07-31 ENCOUNTER — Emergency Department: Payer: BC Managed Care – PPO

## 2022-07-31 ENCOUNTER — Observation Stay: Payer: BC Managed Care – PPO | Admitting: Certified Registered Nurse Anesthetist

## 2022-07-31 ENCOUNTER — Encounter: Payer: Self-pay | Admitting: Emergency Medicine

## 2022-07-31 ENCOUNTER — Encounter
Admission: EM | Disposition: A | Discharge status: Home / Self Care | Payer: Self-pay | Source: Home / Self Care | Attending: Emergency Medicine

## 2022-07-31 ENCOUNTER — Encounter: Payer: Self-pay | Admitting: Family Medicine

## 2022-07-31 ENCOUNTER — Other Ambulatory Visit: Payer: Self-pay

## 2022-07-31 ENCOUNTER — Observation Stay
Admission: EM | Admit: 2022-07-31 | Discharge: 2022-08-01 | Disposition: A | Discharge status: Home / Self Care | Payer: BC Managed Care – PPO | Attending: Surgery | Admitting: Surgery

## 2022-07-31 DIAGNOSIS — K353 Acute appendicitis with localized peritonitis, without perforation or gangrene: Secondary | ICD-10-CM | POA: Diagnosis not present

## 2022-07-31 DIAGNOSIS — I10 Essential (primary) hypertension: Secondary | ICD-10-CM

## 2022-07-31 DIAGNOSIS — K3580 Unspecified acute appendicitis: Secondary | ICD-10-CM | POA: Diagnosis present

## 2022-07-31 DIAGNOSIS — R1031 Right lower quadrant pain: Secondary | ICD-10-CM | POA: Diagnosis present

## 2022-07-31 DIAGNOSIS — Z79899 Other long term (current) drug therapy: Secondary | ICD-10-CM | POA: Diagnosis not present

## 2022-07-31 HISTORY — PX: XI ROBOTIC LAPAROSCOPIC ASSISTED APPENDECTOMY: SHX6877

## 2022-07-31 LAB — COMPREHENSIVE METABOLIC PANEL
ALT: 23 U/L (ref 0–44)
AST: 23 U/L (ref 15–41)
Albumin: 4.5 g/dL (ref 3.5–5.0)
Alkaline Phosphatase: 97 U/L (ref 38–126)
Anion gap: 10 (ref 5–15)
BUN: 15 mg/dL (ref 6–20)
CO2: 26 mmol/L (ref 22–32)
Calcium: 9.2 mg/dL (ref 8.9–10.3)
Chloride: 102 mmol/L (ref 98–111)
Creatinine, Ser: 0.87 mg/dL (ref 0.44–1.00)
GFR, Estimated: >60 mL/min (ref >60)
Glucose, Bld: 149 mg/dL — ABNORMAL HIGH (ref 70–99)
Potassium: 3.8 mmol/L (ref 3.5–5.1)
Sodium: 138 mmol/L (ref 135–145)
Total Bilirubin: 0.7 mg/dL (ref 0.3–1.2)
Total Protein: 8 g/dL (ref 6.5–8.1)

## 2022-07-31 LAB — URINALYSIS, ROUTINE W REFLEX MICROSCOPIC
Bacteria, UA: NONE SEEN
Bilirubin Urine: NEGATIVE
Glucose, UA: NEGATIVE mg/dL
Ketones, ur: NEGATIVE mg/dL
Leukocytes,Ua: NEGATIVE
Nitrite: NEGATIVE
Protein, ur: NEGATIVE mg/dL
Specific Gravity, Urine: 1.034 — ABNORMAL HIGH (ref 1.005–1.030)
pH: 7 (ref 5.0–8.0)

## 2022-07-31 LAB — CBC
HCT: 37.6 % (ref 36.0–46.0)
HCT: 34.2 % — ABNORMAL LOW (ref 36.0–46.0)
Hemoglobin: 12.3 g/dL (ref 12.0–15.0)
Hemoglobin: 11.2 g/dL — ABNORMAL LOW (ref 12.0–15.0)
MCH: 30 pg (ref 26.0–34.0)
MCH: 29.9 pg (ref 26.0–34.0)
MCHC: 32.7 g/dL (ref 30.0–36.0)
MCHC: 32.7 g/dL (ref 30.0–36.0)
MCV: 91.7 fL (ref 80.0–100.0)
MCV: 91.4 fL (ref 80.0–100.0)
Platelets: 246 10*3/uL (ref 150–400)
Platelets: 201 10*3/uL (ref 150–400)
RBC: 4.1 MIL/uL (ref 3.87–5.11)
RBC: 3.74 MIL/uL — ABNORMAL LOW (ref 3.87–5.11)
RDW: 12.6 % (ref 11.5–15.5)
RDW: 12.8 % (ref 11.5–15.5)
WBC: 15 10*3/uL — ABNORMAL HIGH (ref 4.0–10.5)
WBC: 15.9 10*3/uL — ABNORMAL HIGH (ref 4.0–10.5)
nRBC: 0 % (ref 0.0–0.2)
nRBC: 0 % (ref 0.0–0.2)

## 2022-07-31 LAB — CREATININE, SERUM
Creatinine, Ser: 0.93 mg/dL (ref 0.44–1.00)
GFR, Estimated: >60 mL/min (ref >60)

## 2022-07-31 LAB — LIPASE, BLOOD: Lipase: 28 U/L (ref 11–51)

## 2022-07-31 SURGERY — APPENDECTOMY, ROBOT-ASSISTED, LAPAROSCOPIC
Anesthesia: General

## 2022-07-31 MED ORDER — DEXAMETHASONE SODIUM PHOSPHATE 10 MG/ML IJ SOLN
INTRAMUSCULAR | Status: DC | PRN
  Administered 2022-07-31: 10 mg via INTRAVENOUS

## 2022-07-31 MED ORDER — OXYCODONE HCL 5 MG/5ML PO SOLN: 5.0000 mg | Freq: Once | ORAL | Status: AC | PRN

## 2022-07-31 MED ORDER — BUPIVACAINE HCL (PF) 0.5 % IJ SOLN
INTRAMUSCULAR | Status: DC | PRN
  Administered 2022-07-31: 7.5 mL

## 2022-07-31 MED ORDER — FENTANYL CITRATE (PF) 100 MCG/2ML IJ SOLN
INTRAMUSCULAR | Status: AC
  Filled 2022-07-31: qty 2

## 2022-07-31 MED ORDER — LOSARTAN POTASSIUM-HCTZ 100-25 MG PO TABS: 1.0000 | ORAL_TABLET | Freq: Every day | ORAL | Status: DC

## 2022-07-31 MED ORDER — PHENYLEPHRINE HCL (PRESSORS) 10 MG/ML IV SOLN
INTRAVENOUS | Status: DC | PRN
  Administered 2022-07-31 (×6): 160 ug via INTRAVENOUS

## 2022-07-31 MED ORDER — ONDANSETRON HCL 4 MG/2ML IJ SOLN
4.0000 mg | Freq: Once | INTRAMUSCULAR | Status: AC
  Administered 2022-07-31: 4 mg via INTRAVENOUS
  Filled 2022-07-31: qty 2

## 2022-07-31 MED ORDER — EPHEDRINE 5 MG/ML INJ
INTRAVENOUS | Status: AC
  Filled 2022-07-31: qty 5

## 2022-07-31 MED ORDER — SUGAMMADEX SODIUM 200 MG/2ML IV SOLN
INTRAVENOUS | Status: DC | PRN
  Administered 2022-07-31: 200 mg via INTRAVENOUS

## 2022-07-31 MED ORDER — OXYCODONE HCL 5 MG PO TABS
ORAL_TABLET | ORAL | Status: AC
  Filled 2022-07-31: qty 1

## 2022-07-31 MED ORDER — HYDROCODONE-ACETAMINOPHEN 5-325 MG PO TABS
1.0000 | ORAL_TABLET | ORAL | Status: DC | PRN
  Administered 2022-07-31: 2 via ORAL
  Filled 2022-07-31: qty 2

## 2022-07-31 MED ORDER — PANTOPRAZOLE SODIUM 40 MG IV SOLR
40.0000 mg | Freq: Every day | INTRAVENOUS | Status: DC
  Administered 2022-07-31: 40 mg via INTRAVENOUS
  Filled 2022-07-31: qty 10

## 2022-07-31 MED ORDER — MORPHINE SULFATE (PF) 4 MG/ML IV SOLN
4.0000 mg | Freq: Once | INTRAVENOUS | Status: AC
  Administered 2022-07-31: 4 mg via INTRAVENOUS
  Filled 2022-07-31: qty 1

## 2022-07-31 MED ORDER — 0.9 % SODIUM CHLORIDE (POUR BTL) OPTIME
TOPICAL | Status: DC | PRN
  Administered 2022-07-31: 500 mL

## 2022-07-31 MED ORDER — SODIUM CHLORIDE 0.9 % IV SOLN: INTRAVENOUS | Status: DC

## 2022-07-31 MED ORDER — ROCURONIUM BROMIDE 100 MG/10ML IV SOLN
INTRAVENOUS | Status: DC | PRN
  Administered 2022-07-31: 100 mg via INTRAVENOUS

## 2022-07-31 MED ORDER — TRAMADOL HCL 50 MG PO TABS: 50.0000 mg | ORAL_TABLET | Freq: Four times a day (QID) | ORAL | Status: DC | PRN

## 2022-07-31 MED ORDER — FLUOXETINE HCL 10 MG PO CAPS
10.0000 mg | ORAL_CAPSULE | Freq: Every day | ORAL | Status: DC
  Administered 2022-07-31 – 2022-08-01 (×2): 10 mg via ORAL
  Filled 2022-07-31 (×2): qty 1

## 2022-07-31 MED ORDER — AMLODIPINE BESYLATE 5 MG PO TABS
5.0000 mg | ORAL_TABLET | Freq: Every day | ORAL | Status: DC
  Administered 2022-07-31 – 2022-08-01 (×2): 5 mg via ORAL
  Filled 2022-07-31 (×2): qty 1

## 2022-07-31 MED ORDER — LIDOCAINE-EPINEPHRINE (PF) 1 %-1:200000 IJ SOLN
INTRAMUSCULAR | Status: AC
  Filled 2022-07-31: qty 30

## 2022-07-31 MED ORDER — HYDROMORPHONE HCL 1 MG/ML IJ SOLN: 0.2500 mg | INTRAMUSCULAR | Status: DC | PRN

## 2022-07-31 MED ORDER — BUPIVACAINE HCL (PF) 0.5 % IJ SOLN
INTRAMUSCULAR | Status: AC
  Filled 2022-07-31: qty 30

## 2022-07-31 MED ORDER — ACETAMINOPHEN 10 MG/ML IV SOLN
INTRAVENOUS | Status: AC
  Filled 2022-07-31: qty 100

## 2022-07-31 MED ORDER — PIPERACILLIN-TAZOBACTAM 3.375 G IVPB 30 MIN
3.3750 g | Freq: Once | INTRAVENOUS | Status: AC
  Administered 2022-07-31: 3.375 g via INTRAVENOUS
  Filled 2022-07-31: qty 50

## 2022-07-31 MED ORDER — METRONIDAZOLE 500 MG/100ML IV SOLN
500.0000 mg | Freq: Two times a day (BID) | INTRAVENOUS | Status: DC
  Administered 2022-07-31: 500 mg via INTRAVENOUS
  Filled 2022-07-31 (×3): qty 100

## 2022-07-31 MED ORDER — SODIUM CHLORIDE 0.9 % IV SOLN
2.0000 g | INTRAVENOUS | Status: DC
  Administered 2022-07-31: 2 g via INTRAVENOUS
  Filled 2022-07-31 (×3): qty 20

## 2022-07-31 MED ORDER — LOSARTAN POTASSIUM 50 MG PO TABS
100.0000 mg | ORAL_TABLET | Freq: Every day | ORAL | Status: DC
  Administered 2022-07-31 – 2022-08-01 (×2): 100 mg via ORAL
  Filled 2022-07-31 (×2): qty 2

## 2022-07-31 MED ORDER — MIDAZOLAM HCL 2 MG/2ML IJ SOLN
INTRAMUSCULAR | Status: AC
  Filled 2022-07-31: qty 2

## 2022-07-31 MED ORDER — PROPOFOL 10 MG/ML IV BOLUS
INTRAVENOUS | Status: DC | PRN
  Administered 2022-07-31: 100 mg via INTRAVENOUS

## 2022-07-31 MED ORDER — ONDANSETRON HCL 4 MG/2ML IJ SOLN
INTRAMUSCULAR | Status: DC | PRN
  Administered 2022-07-31: 4 mg via INTRAVENOUS

## 2022-07-31 MED ORDER — DOCUSATE SODIUM 100 MG PO CAPS: 100.0000 mg | ORAL_CAPSULE | Freq: Two times a day (BID) | ORAL | Status: DC | PRN

## 2022-07-31 MED ORDER — LACTATED RINGERS IV SOLN: INTRAVENOUS | Status: DC | PRN

## 2022-07-31 MED ORDER — LIDOCAINE-EPINEPHRINE (PF) 1 %-1:200000 IJ SOLN
INTRAMUSCULAR | Status: DC | PRN
  Administered 2022-07-31: 7.5 mL

## 2022-07-31 MED ORDER — ACETAMINOPHEN 10 MG/ML IV SOLN
INTRAVENOUS | Status: DC | PRN
  Administered 2022-07-31: 1000 mg via INTRAVENOUS

## 2022-07-31 MED ORDER — IOHEXOL 300 MG/ML  SOLN
100.0000 mL | Freq: Once | INTRAMUSCULAR | Status: AC | PRN
  Administered 2022-07-31: 100 mL via INTRAVENOUS

## 2022-07-31 MED ORDER — ONDANSETRON HCL 4 MG/2ML IJ SOLN
4.0000 mg | Freq: Four times a day (QID) | INTRAMUSCULAR | Status: DC | PRN
  Administered 2022-07-31: 4 mg via INTRAVENOUS
  Filled 2022-07-31: qty 2

## 2022-07-31 MED ORDER — EPHEDRINE SULFATE (PRESSORS) 50 MG/ML IJ SOLN
INTRAMUSCULAR | Status: DC | PRN
  Administered 2022-07-31 (×2): 10 mg via INTRAVENOUS

## 2022-07-31 MED ORDER — FENTANYL CITRATE (PF) 100 MCG/2ML IJ SOLN
INTRAMUSCULAR | Status: DC | PRN
  Administered 2022-07-31: 50 ug via INTRAVENOUS

## 2022-07-31 MED ORDER — OXYCODONE HCL 5 MG PO TABS
5.0000 mg | ORAL_TABLET | Freq: Once | ORAL | Status: AC | PRN
  Administered 2022-07-31: 5 mg via ORAL

## 2022-07-31 MED ORDER — PHENYLEPHRINE 80 MCG/ML (10ML) SYRINGE FOR IV PUSH (FOR BLOOD PRESSURE SUPPORT)
PREFILLED_SYRINGE | INTRAVENOUS | Status: AC
  Filled 2022-07-31: qty 10

## 2022-07-31 MED ORDER — ROCURONIUM BROMIDE 10 MG/ML (PF) SYRINGE
PREFILLED_SYRINGE | INTRAVENOUS | Status: AC
  Filled 2022-07-31: qty 10

## 2022-07-31 MED ORDER — MIDAZOLAM HCL 2 MG/2ML IJ SOLN
INTRAMUSCULAR | Status: DC | PRN
  Administered 2022-07-31: 2 mg via INTRAVENOUS

## 2022-07-31 MED ORDER — MORPHINE SULFATE (PF) 2 MG/ML IV SOLN
2.0000 mg | INTRAVENOUS | Status: DC | PRN
  Administered 2022-07-31: 2 mg via INTRAVENOUS
  Filled 2022-07-31: qty 1

## 2022-07-31 MED ORDER — BUPROPION HCL ER (XL) 150 MG PO TB24
150.0000 mg | ORAL_TABLET | Freq: Every day | ORAL | Status: DC
  Administered 2022-07-31 – 2022-08-01 (×2): 150 mg via ORAL
  Filled 2022-07-31 (×2): qty 1

## 2022-07-31 MED ORDER — LIDOCAINE HCL (CARDIAC) PF 100 MG/5ML IV SOSY
PREFILLED_SYRINGE | INTRAVENOUS | Status: DC | PRN
  Administered 2022-07-31: 100 mg via INTRAVENOUS

## 2022-07-31 MED ORDER — HYDROCHLOROTHIAZIDE 25 MG PO TABS
25.0000 mg | ORAL_TABLET | Freq: Every day | ORAL | Status: DC
  Administered 2022-07-31 – 2022-08-01 (×2): 25 mg via ORAL
  Filled 2022-07-31 (×2): qty 1

## 2022-07-31 MED ORDER — ONDANSETRON 4 MG PO TBDP: 4.0000 mg | ORAL_TABLET | Freq: Four times a day (QID) | ORAL | Status: DC | PRN

## 2022-07-31 SURGICAL SUPPLY — 58 items
ANCHOR TIS RET SYS 235ML (MISCELLANEOUS) ×1 IMPLANT
BAG PRESSURE INF REUSE 1000 (BAG) IMPLANT
BLADE SURG SZ11 CARB STEEL (BLADE) ×1 IMPLANT
BULB RESERV EVAC DRAIN JP 100C (MISCELLANEOUS) IMPLANT
COVER TIP SHEARS 8 DVNC (MISCELLANEOUS) ×1 IMPLANT
DERMABOND ADVANCED .7 DNX12 (GAUZE/BANDAGES/DRESSINGS) ×1 IMPLANT
DRAIN CHANNEL JP 15F RND 16 (MISCELLANEOUS) IMPLANT
DRAPE ARM DVNC X/XI (DISPOSABLE) ×3 IMPLANT
DRAPE COLUMN DVNC XI (DISPOSABLE) ×1 IMPLANT
ELECT CAUTERY BLADE 6.4 (BLADE) IMPLANT
ELECT REM PT RETURN 9FT ADLT (ELECTROSURGICAL) ×1
ELECTRODE REM PT RTRN 9FT ADLT (ELECTROSURGICAL) ×1 IMPLANT
FORCEPS BPLR 8 MD DVNC XI (FORCEP) ×1 IMPLANT
FORCEPS BPLR FENES DVNC XI (FORCEP) ×1 IMPLANT
GLOVE BIOGEL PI IND STRL 7.0 (GLOVE) ×2 IMPLANT
GLOVE SURG SYN 6.5 ES PF (GLOVE) ×2 IMPLANT
GLOVE SURG SYN 6.5 PF PI (GLOVE) ×2 IMPLANT
GOWN STRL REUS W/ TWL LRG LVL3 (GOWN DISPOSABLE) ×3 IMPLANT
GOWN STRL REUS W/TWL LRG LVL3 (GOWN DISPOSABLE) ×3
GRASPER SUT TROCAR 14GX15 (MISCELLANEOUS) IMPLANT
IRRIGATOR SUCT 8 DISP DVNC XI (IRRIGATION / IRRIGATOR) IMPLANT
IV NS 1000ML (IV SOLUTION) ×1
IV NS 1000ML BAXH (IV SOLUTION) IMPLANT
JACKSON PRATT 7MM (INSTRUMENTS) IMPLANT
KIT TURNOVER KIT A (KITS) ×1 IMPLANT
LABEL OR SOLS (LABEL) IMPLANT
MANIFOLD NEPTUNE II (INSTRUMENTS) ×1 IMPLANT
NDL HYPO 22X1.5 SAFETY MO (MISCELLANEOUS) ×1 IMPLANT
NDL INSUFFLATION 14GA 120MM (NEEDLE) ×1 IMPLANT
NEEDLE HYPO 22X1.5 SAFETY MO (MISCELLANEOUS) ×1 IMPLANT
NEEDLE INSUFFLATION 14GA 120MM (NEEDLE) ×1 IMPLANT
OBTURATOR OPTICAL STND 8 DVNC (TROCAR) ×1
OBTURATOR OPTICALSTD 8 DVNC (TROCAR) ×1 IMPLANT
PACK LAP CHOLECYSTECTOMY (MISCELLANEOUS) ×1 IMPLANT
PENCIL SMOKE EVACUATOR (MISCELLANEOUS) ×1 IMPLANT
RELOAD STAPLE 45 2.5 WHT DVNC (STAPLE) IMPLANT
RELOAD STAPLE 45 3.5 BLU DVNC (STAPLE) ×1 IMPLANT
RELOAD STAPLER 2.5X45 WHT DVNC (STAPLE) ×1 IMPLANT
RELOAD STAPLER 3.5X45 BLU DVNC (STAPLE) ×1 IMPLANT
SCISSORS MNPLR CVD DVNC XI (INSTRUMENTS) ×1 IMPLANT
SEAL UNIV 5-12 XI (MISCELLANEOUS) ×3 IMPLANT
SET TUBE SMOKE EVAC HIGH FLOW (TUBING) ×1 IMPLANT
SOL ELECTROSURG ANTI STICK (MISCELLANEOUS) ×1
SOLUTION ELECTROSURG ANTI STCK (MISCELLANEOUS) ×1 IMPLANT
STAPLER 45 SUREFORM DVNC (STAPLE) ×1 IMPLANT
STAPLER RELOAD 2.5X45 WHT DVNC (STAPLE) ×1
STAPLER RELOAD 3.5X45 BLU DVNC (STAPLE) ×1
SUT ETHILON 3-0 FS-10 30 BLK (SUTURE)
SUT MNCRL AB 4-0 PS2 18 (SUTURE) ×1 IMPLANT
SUT VIC AB 3-0 SH 27 (SUTURE)
SUT VIC AB 3-0 SH 27X BRD (SUTURE) IMPLANT
SUT VICRYL 0 UR6 27IN ABS (SUTURE) ×1 IMPLANT
SUTURE EHLN 3-0 FS-10 30 BLK (SUTURE) IMPLANT
SYR 30ML LL (SYRINGE) ×1 IMPLANT
SYSTEM WECK SHIELD CLOSURE (TROCAR) IMPLANT
TRAP FLUID SMOKE EVACUATOR (MISCELLANEOUS) ×1 IMPLANT
TRAY FOLEY MTR SLVR 16FR STAT (SET/KITS/TRAYS/PACK) ×1 IMPLANT
WATER STERILE IRR 500ML POUR (IV SOLUTION) ×1 IMPLANT

## 2022-07-31 NOTE — Op Note (Signed)
Preoperative diagnosis: acute appendicitis  Postoperative diagnosis: Same  Procedure: Robotic assisted laparoscopic appendectomy.  Anesthesia: GETA  Surgeon: Sung Amabile  Wound Classification: clean contaminated  Specimen: Appendix  Complications: None  Estimated Blood Loss: 3 mL   Indications: Patient is a 52 y.o. female  presented with above.  Please see H&P for further details.    FIndings: 1.  Irritated appendix  2. No peri-appendiceal abscess or phlegmon 3. Normal anatomy 4. Appendiceal artery ligated and divided with stapler 5. Adequate hemostasis.   Description of procedure: The patient was placed on the operating table in the supine position, left arm tucked. General anesthesia was induced. A time-out was completed verifying correct patient, procedure, site, positioning, and implant(s) and/or special equipment prior to beginning this procedure. The abdomen was prepped and draped in the usual sterile fashion.   Palmer's point located and Veress needle was inserted.  After confirming 2 clicks and a positive saline drop test, gas insufflation was initiated until the abdominal pressure was measured at 15 mmHg.  Afterwards, the Veress needle was removed and a 8 mm port was placed through a periumbilical site using Optiview technique after incision with an 11 blade.  After local was infused, 2 additional incision was made 8 cm apart each side along the left side of the abdominal wall from the initial incision.  An 8 mm port was caudaed and 12mm port cephalad from initial incision, both under direct visualization.  No injuries from trocar placements were noted. The table was placed in the Trendelenburg position with the right side elevated.  Xi robotic platform was then brought to the operative field and docked.  An inflamed appendix was identified and elevated.  Infection was present within the abdominal cavity due to appendicitis. Window created at base of appendix in the  mesentery.   A blue load linear cutting stapler was then used to divide and staple the base of the appendix. It was reloaded with a vascular cartridge and the mesoappendix similarly divided.  No bleeding from the staple lines noted.  The appendix was placed in an endoscopic retrieval bag and removed.   The appendiceal stump and mesoappendix staple line examined again and hemostasis noted. No other pathology was identified within pelvis. The 12 mm trocar removed and port site closed with Efx Shield using 0 vicryl under direct vision. Remaining trocars were removed under direct vision. No bleeding was noted.The abdomen was allowed to collapse. All skin incisions then closed with subcuticular sutures Monocryl 4-0.  Wounds then dressed with dermabond.  The patient tolerated the procedure well, awakened from anesthesia and was taken to the postanesthesia care unit in satisfactory condition.  Sponge count and instrument count correct at the end of the procedure.

## 2022-07-31 NOTE — Anesthesia Procedure Notes (Signed)
Procedure Name: Intubation Date/Time: 07/31/2022 5:10 PM  Performed by: Stormy Fabian, CRNAPre-anesthesia Checklist: Patient identified, Patient being monitored, Timeout performed, Emergency Drugs available and Suction available Patient Re-evaluated:Patient Re-evaluated prior to induction Oxygen Delivery Method: Circle system utilized Preoxygenation: Pre-oxygenation with 100% oxygen Induction Type: IV induction Ventilation: Mask ventilation without difficulty Laryngoscope Size: Mac and 3 Grade View: Grade I Tube type: Oral Tube size: 7.0 mm Number of attempts: 1 Airway Equipment and Method: Stylet Placement Confirmation: ETT inserted through vocal cords under direct vision, positive ETCO2 and breath sounds checked- equal and bilateral Secured at: 22 cm Tube secured with: Tape Dental Injury: Teeth and Oropharynx as per pre-operative assessment

## 2022-07-31 NOTE — ED Triage Notes (Signed)
Pt arrived via POV with reports of RLQ abd pain that started around 12:30am, pt reports nausea, guarding RLQ, states she feels swollen, still has appendix, appears uncomfortable in triage, Reports the pain is 8/10 and is constant.

## 2022-07-31 NOTE — ED Provider Notes (Signed)
[facility] Provider Note    Event Date/Time   First MD Initiated Contact with Patient 07/31/22 [phone]     (approximate)   History   Abdominal Pain   HPI  Cassidy Kelly is a 52 y.o. female with a past medical history of hypertension, hypercholesterolemia, obesity who presents today for evaluation of right lower quadrant pain that began at 12:30 AM.  Patient reports that it awoke her from sleep.  She reports that it began periumbilically and has since moved to her right lower quadrant.  She feels nauseated but she has not had any vomiting.  She denies fevers or chills.  No urinary symptoms.  Patient Active Problem List   Diagnosis Date Noted   Acute appendicitis 07/31/2022   Fatigue 06/28/2022   Snoring 06/28/2022   Screening mammogram for breast cancer 05/05/2022   Depression, recurrent (HCC) 07/19/2021   Pre-diabetes 05/19/2021   Morbid obesity (HCC) 09/24/2020   Hypercholesterolemia 08/08/2017   Essential hypertension 08/08/2017          Physical Exam   Triage Vital Signs: ED Triage Vitals  Enc Vitals Group     BP 07/31/22 0555 (!) 175/88     Pulse Rate 07/31/22 0555 91     Resp 07/31/22 0555 20     Temp 07/31/22 0555 97.8 F (36.6 C)     Temp Source 07/31/22 0555 Oral     SpO2 07/31/22 0555 99 %     Weight 07/31/22 0547 210 lb (95.3 kg)     Height 07/31/22 0547 5\' 2"  (1.575 m)     Head Circumference --      Peak Flow --      Pain Score 07/31/22 0547 8     Pain Loc --      Pain Edu? --      Excl. in GC? --     Most recent vital signs: Vitals:   07/31/22 0555  BP: (!) 175/88  Pulse: 91  Resp: 20  Temp: 97.8 F (36.6 C)  SpO2: 99%    Physical Exam Vitals and nursing note reviewed.  Constitutional:      General: Awake and alert. No acute distress.    Appearance: Normal appearance. The patient is obese.  HENT:     Head: Normocephalic and atraumatic.     Mouth: Mucous membranes are moist.  Eyes:     General: PERRL.  Normal EOMs        Right eye: No discharge.        Left eye: No discharge.     Conjunctiva/sclera: Conjunctivae normal.  Cardiovascular:     Rate and Rhythm: Normal rate and regular rhythm.     Pulses: Normal pulses.  Pulmonary:     Effort: Pulmonary effort is normal. No respiratory distress.     Breath sounds: Normal breath sounds.  Abdominal:     Abdomen is soft. There is right lower quadrant abdominal tenderness. No rebound or guarding. No distention.  Negative Rovsing sign.  No CVA tenderness Musculoskeletal:        General: No swelling. Normal range of motion.     Cervical back: Normal range of motion and neck supple.  Skin:    General: Skin is warm and dry.     Capillary Refill: Capillary refill takes less than 2 seconds.     Findings: No rash.  Neurological:     Mental Status: The patient is awake and alert.      ED Results /  Procedures / Treatments   Labs (all labs ordered are listed, but only abnormal results are displayed) Labs Reviewed  COMPREHENSIVE METABOLIC PANEL - Abnormal; Notable for the following components:      Result Value   Glucose, Bld 149 (*)    All other components within normal limits  CBC - Abnormal; Notable for the following components:   WBC 15.0 (*)    All other components within normal limits  URINALYSIS, ROUTINE W REFLEX MICROSCOPIC - Abnormal; Notable for the following components:   Color, Urine STRAW (*)    APPearance CLEAR (*)    Specific Gravity, Urine 1.034 (*)    Hgb urine dipstick MODERATE (*)    All other components within normal limits  LIPASE, BLOOD  HIV ANTIBODY (ROUTINE TESTING W REFLEX)     EKG     RADIOLOGY I independently reviewed and interpreted imaging and agree with radiologists findings.     PROCEDURES:  Critical Care performed:   Procedures   MEDICATIONS ORDERED IN ED: Medications  buPROPion (WELLBUTRIN XL) 24 hr tablet 150 mg (150 mg Oral Given 07/31/22 1039)  FLUoxetine (PROZAC) capsule 10 mg (10  mg Oral Given 07/31/22 1039)  amLODipine (NORVASC) tablet 5 mg (5 mg Oral Given 07/31/22 1039)  traMADol (ULTRAM) tablet 50 mg (has no administration in time range)  HYDROcodone-acetaminophen (NORCO/VICODIN) 5-325 MG per tablet 1-2 tablet (2 tablets Oral Given 07/31/22 1039)  morphine (PF) 2 MG/ML injection 2 mg (has no administration in time range)  docusate sodium (COLACE) capsule 100 mg (has no administration in time range)  ondansetron (ZOFRAN-ODT) disintegrating tablet 4 mg (has no administration in time range)    Or  ondansetron (ZOFRAN) injection 4 mg (has no administration in time range)  cefTRIAXone (ROCEPHIN) 2 g in sodium chloride 0.9 % 100 mL IVPB (has no administration in time range)    And  metroNIDAZOLE (FLAGYL) IVPB 500 mg (has no administration in time range)  pantoprazole (PROTONIX) injection 40 mg (has no administration in time range)  losartan (COZAAR) tablet 100 mg (100 mg Oral Given 07/31/22 1039)    And  hydrochlorothiazide (HYDRODIURIL) tablet 25 mg (25 mg Oral Given 07/31/22 1040)  morphine (PF) 4 MG/ML injection 4 mg (4 mg Intravenous Given 07/31/22 0726)  ondansetron (ZOFRAN) injection 4 mg (4 mg Intravenous Given 07/31/22 0726)  iohexol (OMNIPAQUE) 300 MG/ML solution 100 mL (100 mLs Intravenous Contrast Given 07/31/22 0731)  piperacillin-tazobactam (ZOSYN) IVPB 3.375 g (0 g Intravenous Stopped 07/31/22 0908)     IMPRESSION / MDM / ASSESSMENT AND PLAN / ED COURSE  I reviewed the triage vital signs and the nursing notes.   Differential diagnosis includes, but is not limited to, appendicitis, urinary tract infection, pyelonephritis, diverticulitis.  Patient is awake and alert, hypertensive, though normal heart rate and afebrile.  She appears to be uncomfortable.  She has tenderness in her right lower quadrant.  Further workup is indicated.  IV was established and labs were obtained.  Labs reveal a leukocytosis to 15.  Labs were obtained in triage.  Patient agreed to CAT scan.   She was treated symptomatically with morphine and Zofran in the meantime.  CAT scan reveals acute appendicitis without perforation or abscess.  Patient was started on Zosyn and I discussed with Dr. Tonna Boehringer with general surgery who was agreed to admit the patient for definitive management.  She was made NPO. Patient and her family members understand and agree with plan.     Patient's presentation is most consistent with  acute presentation with potential threat to life or bodily function.   Clinical Course as of 07/31/22 1301  Mon Jul 31, 2022  [id number] Dr. Tonna Boehringer will come to see patient [JP]    Clinical Course User Index [JP] Damian Buckles, Herb Grays, PA-C     FINAL CLINICAL IMPRESSION(S) / ED DIAGNOSES   Final diagnoses:  Acute appendicitis with localized peritonitis, without perforation, abscess, or gangrene     Rx / DC Orders   ED Discharge Orders     None        Note:  This document was prepared using Dragon voice recognition software and may include unintentional dictation errors.   Keturah Shavers 07/31/22 1301    Minna Antis, MD 07/31/22 1523

## 2022-07-31 NOTE — Anesthesia Preprocedure Evaluation (Signed)
Anesthesia Evaluation  Patient identified by MRN, date of birth, ID band Patient awake    Reviewed: Allergy & Precautions, NPO status , Patient's Chart, lab work & pertinent test results  History of Anesthesia Complications Negative for: history of anesthetic complications  Airway Mallampati: III  TM Distance: >3 FB Neck ROM: full    Dental no notable dental hx.    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 No date: Hypercholesterolemia No date: Hypertension No date: Obesity (BMI 30-39.9)  Past Surgical History: 09/30/2018: COLONOSCOPY WITH PROPOFOL; N/A     Comment:  Procedure: COLONOSCOPY WITH PROPOFOL;  Surgeon: Wyline Mood, MD;  Location: [facility] ENDOSCOPY;  Service:               Gastroenterology;  Laterality: N/A; No date: WISDOM TOOTH EXTRACTION     Comment:  AGE 52; ALL FOUR  BMI    Body Mass Index: 38.41 kg/m      Reproductive/Obstetrics negative OB ROS                              Anesthesia Physical Anesthesia Plan  ASA: 2  Anesthesia Plan: General ETT   Post-op Pain Management:    Induction: Intravenous  PONV Risk Score and Plan: Ondansetron, Dexamethasone, Midazolam and Treatment may vary due to age or medical condition  Airway Management Planned: Oral ETT  Additional Equipment:   Intra-op Plan:   Post-operative Plan: Extubation in OR  Informed Consent: I have reviewed the patients History  and Physical, chart, labs and discussed the procedure including the risks, benefits and alternatives for the proposed anesthesia with the patient or authorized representative who has indicated his/her understanding and acceptance.     Dental Advisory Given  Plan Discussed with: Anesthesiologist, CRNA and Surgeon  Anesthesia Plan Comments: (Patient consented for risks of anesthesia including but not limited to:  - adverse reactions to medications - damage to eyes, teeth, lips or other oral mucosa - nerve damage due to positioning  - sore throat or hoarseness - Damage to heart, brain, nerves, lungs, other parts of body or loss of life  Patient voiced understanding.)         Anesthesia Quick Evaluation

## 2022-07-31 NOTE — H&P (Signed)
Subjective:   CC: Acute appendicitis  HPI:  Cassidy Kelly is a 52 y.o. female who is consulted by Poggi for evaluation of  above cc.  Symptoms were first noted 1 day ago. Pain is sharp, right lower quadrant.  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  Past Surgical History:  has a past surgical history that includes Wisdom tooth extraction and Colonoscopy with propofol (N/A, 09/30/2018).  Family History: family history includes Diabetes in her maternal grandmother; Healthy in her mother; Heart disease in her paternal grandfather.  Social History:  reports that she has never smoked. She has never used smokeless tobacco. She reports current alcohol use. She reports that she does not use drugs.  Current Medications:  Prior to Admission medications   Medication Sig Start Date End Date Taking? Authorizing Provider  amLODipine (NORVASC) 10 MG tablet Take 0.5 tablets (5 mg total) by mouth daily. Patient taking differently: Take 10 mg by mouth daily. 06/28/22  Yes Jacky Kindle, FNP  buPROPion (WELLBUTRIN XL) 150 MG 24 hr tablet Take 1 tablet (150 mg total) by mouth daily. 11/01/21  Yes Merita Norton T, FNP  cholecalciferol (VITAMIN D3) 25 MCG (1000 UNIT) tablet Take 1,000 Units by mouth daily.   Yes [provider]  FLUoxetine (PROZAC) 10 MG capsule Take 1 capsule (10 mg total) by mouth daily. 05/05/22  Yes Jacky Kindle, FNP  losartan-hydrochlorothiazide (HYZAAR) 100-25 MG tablet TAKE 1 TABLET BY MOUTH DAILY 03/09/22  Yes Merita Norton T, FNP  meloxicam (MOBIC) 15 MG tablet Take 15 mg by mouth daily. 02/28/22  Yes [provider]  Multiple Vitamin (MULTIVITAMIN) tablet Take 1 tablet by mouth daily.   Yes [provider]  scopolamine (TRANSDERM-SCOP) 1 MG/3DAYS Place 1 patch (1.5 mg total) onto the skin every 3 (three)  days. 07/19/21  Yes Jacky Kindle, FNP  TURMERIC PO Take by mouth.   Yes [provider]    Allergies:  Allergies as of 07/31/2022 - Review Complete 07/31/2022  Allergen Reaction Noted   Other  08/08/2017   Pollen extract  08/08/2017    ROS:  General: Denies weight loss, weight gain, fatigue, fevers, chills, and night sweats. Eyes: Denies blurry vision, double vision, eye pain, itchy eyes, and tearing. Ears: Denies hearing loss, earache, and ringing in ears. Nose: Denies sinus pain, congestion, infections, runny nose, and nosebleeds. Mouth/throat: Denies hoarseness, sore throat, bleeding gums, and difficulty swallowing. Heart: Denies chest pain, palpitations, racing heart, irregular heartbeat, leg pain or swelling, and decreased activity tolerance. Respiratory: Denies breathing difficulty, shortness of breath, wheezing, cough, and sputum. GI: Denies change in appetite, heartburn, nausea, vomiting, constipation, diarrhea, and blood in stool. GU: Denies difficulty urinating, pain with urinating, urgency, frequency, blood in urine. Musculoskeletal: Denies joint stiffness, pain, swelling, muscle weakness. Skin: Denies rash, itching, mass, tumors, sores, and boils Neurologic: Denies headache, fainting, dizziness, seizures, numbness, and tingling. Psychiatric: Denies depression, anxiety, difficulty sleeping, and memory loss. Endocrine: Denies heat or cold intolerance, and increased thirst or urination. Blood/lymph: Denies easy bruising, easy bruising, and swollen glands     Objective:     BP 115/63 (BP Location: Right Arm)   Pulse 66   Temp 98.6 F (37 C) (Oral)   Resp 18   Ht 5\' 2"  (1.575 m)   Wt 95.3 kg   LMP 08/01/2017   SpO2 99%  BMI 38.41 kg/m    Constitutional :  alert, cooperative, appears stated age, and no distress  Lymphatics/Throat:  no asymmetry, masses, or scars  Respiratory:  clear to auscultation bilaterally  Cardiovascular:  regular rate and rhythm   Gastrointestinal: Soft, no guarding, tenderness to palpation right lower quadrant .   Musculoskeletal: Steady gait and movement  Skin: Cool and moist  Psychiatric: Normal affect, non-agitated, not confused       LABS:     Latest Ref Rng & Units 07/31/2022    5:49 AM 05/08/2022    8:33 AM 04/29/2021    8:22 AM  CMP  Glucose 70 - 99 mg/dL 161  096  045   BUN 6 - 20 mg/dL 15  14  12    Creatinine 0.44 - 1.00 mg/dL 4.09  8.11  9.14   Sodium 135 - 145 mmol/L 138  142  138   Potassium 3.5 - 5.1 mmol/L 3.8  4.1  4.1   Chloride 98 - 111 mmol/L 102  104  102   CO2 22 - 32 mmol/L 26  24  23    Calcium 8.9 - 10.3 mg/dL 9.2  9.0  9.5   Total Protein 6.5 - 8.1 g/dL 8.0  6.8  7.0   Total Bilirubin 0.3 - 1.2 mg/dL 0.7  <7.8  0.3   Alkaline Phos 38 - 126 U/L 97  114  134   AST 15 - 41 U/L 23  17  28    ALT 0 - 44 U/L 23  17  42       Latest Ref Rng & Units 07/31/2022    5:49 AM 05/08/2022    8:33 AM 07/19/2021    4:35 PM  CBC  WBC 4.0 - 10.5 K/uL 15.0  7.8  9.8   Hemoglobin 12.0 - 15.0 g/dL 29.5  62.1  30.8   Hematocrit 36.0 - 46.0 % 37.6  32.9  35.9   Platelets 150 - 400 K/uL 246  202  235      RADS: Narrative & Impression  CLINICAL DATA:  Frontal quadrant pain   EXAM: CT ABDOMEN AND PELVIS WITH CONTRAST   TECHNIQUE: Multidetector CT imaging of the abdomen and pelvis was performed using the standard protocol following bolus administration of intravenous contrast.   RADIATION DOSE REDUCTION: This exam was performed according to the departmental dose-optimization program which includes automated exposure control, adjustment of the mA and/or kV according to patient size and/or use of iterative reconstruction technique.   CONTRAST:  OMNIPAQUE IOHEXOL 300 MG/ML  SOLN   COMPARISON:  01/19/2006   FINDINGS: Lower chest: No acute abnormality.   Hepatobiliary: 17 mm cyst in the right hepatic lobe. Liver is otherwise unremarkable. No gallstones, gallbladder wall thickening, or  biliary dilatation.   Pancreas: Unremarkable. No pancreatic ductal dilatation or surrounding inflammatory changes.   Spleen: Normal in size without focal abnormality.   Adrenals/Urinary Tract: Adrenal glands are unremarkable. Kidneys are normal, without renal calculi, focal lesion, or hydronephrosis. Bladder is unremarkable.   Stomach/Bowel: Stomach is within normal limits. No bowel dilatation to suggest bowel obstruction dilated appendix with periappendiceal inflammatory changes consistent with acute appendicitis. No focal fluid collection to suggest an abscess.   Vascular/Lymphatic: No significant vascular findings are present. No enlarged abdominal or pelvic lymph nodes.   Reproductive: Uterus and bilateral adnexa are unremarkable.   Other: No abdominal wall hernia or abnormality. Small amount of right lower quadrant free fluid.   Musculoskeletal: No acute osseous abnormality. No  aggressive osseous lesion. Small bone island in the left femoral head. Severe left facet arthropathy at L3-4.   IMPRESSION: 1. Acute appendicitis. No focal fluid collection to suggest an abscess.     Electronically Signed   By: Elige Ko M.D.   On: 07/31/2022 08:13     Assessment:      Acute appendicitis  Plan:      Discussed the risk of surgery including post-op infxn, seroma, hematoma, abscess formation, chronic pain, poor-delayed wound healing, possible bowel resection, possible ostomy, possible conversion to open procedure, post-op SBO or ileus, and need for additional procedures to address said risks.  The risks of general anesthetic including MI, CVA, sudden death or even reaction to anesthetic medications also discussed. Alternatives include continued observation, or antibiotic treatment.  Benefits include possible symptom relief,   Typical post operative recovery of 3-5 days rest, also discussed.  The patient understands the risks, any and all questions were answered to the  patient's satisfaction.  2 OR for robotic assisted laparoscopic appendectomy.  N.p.o., IV fluids, in the meantime  labs/images/medications/previous chart entries reviewed personally and relevant changes/updates noted above.

## 2022-07-31 NOTE — Transfer of Care (Signed)
Immediate Anesthesia Transfer of Care Note  Patient: Cassidy Kelly  Procedure(s) Performed: XI ROBOTIC LAPAROSCOPIC ASSISTED APPENDECTOMY  Patient Location: PACU  Anesthesia Type:General  Level of Consciousness: drowsy  Airway & Oxygen Therapy: Patient Spontanous Breathing and Patient connected to face mask oxygen  Post-op Assessment: Report given to RN and Post -op Vital signs reviewed and stable  Post vital signs: Reviewed and stable  Last Vitals:  Vitals Value Taken Time  BP 87/49 07/31/22 1836  Temp    Pulse 62 07/31/22 1837  Resp 14 07/31/22 1837  SpO2 100 % 07/31/22 1837  Vitals shown include unvalidated device data.  Last Pain:  Vitals:   07/31/22 1640  TempSrc: Oral  PainSc: 6          Complications: No notable events documented.

## 2022-08-01 ENCOUNTER — Encounter: Payer: Self-pay | Admitting: Surgery

## 2022-08-01 LAB — HIV ANTIBODY (ROUTINE TESTING W REFLEX): HIV Screen 4th Generation wRfx: NONREACTIVE

## 2022-08-01 MED ORDER — DOCUSATE SODIUM 100 MG PO CAPS: 100.0000 mg | ORAL_CAPSULE | Freq: Two times a day (BID) | ORAL | 0 refills | Status: AC | PRN

## 2022-08-01 MED ORDER — ACETAMINOPHEN 325 MG PO TABS: 650.0000 mg | ORAL_TABLET | Freq: Three times a day (TID) | ORAL | 0 refills | Status: AC | PRN

## 2022-08-01 MED ORDER — HYDROCODONE-ACETAMINOPHEN 5-325 MG PO TABS: 1.0000 | ORAL_TABLET | Freq: Four times a day (QID) | ORAL | 0 refills | Status: DC | PRN

## 2022-08-01 MED ORDER — AMLODIPINE BESYLATE 10 MG PO TABS
10.0000 mg | ORAL_TABLET | Freq: Every day | ORAL | Status: DC
Start: 2022-08-01 — End: 2022-10-06

## 2022-08-01 NOTE — Anesthesia Postprocedure Evaluation (Signed)
Anesthesia Post Note  Patient: Cassidy Kelly  Procedure(s) Performed: XI ROBOTIC LAPAROSCOPIC ASSISTED APPENDECTOMY  Patient location during evaluation: PACU Anesthesia Type: General Level of consciousness: awake and alert Pain management: pain level controlled Vital Signs Assessment: post-procedure vital signs reviewed and stable Respiratory status: spontaneous breathing, nonlabored ventilation, respiratory function stable and patient connected to nasal cannula oxygen Cardiovascular status: blood pressure returned to baseline and stable Postop Assessment: no apparent nausea or vomiting Anesthetic complications: no   No notable events documented.   Last Vitals:  Vitals:   07/31/22 2050 07/31/22 2201  BP: (!) 105/55 100/63  Pulse: (!) 57 93  Resp: 17 18  Temp: 36.7 C 37.1 C  SpO2: 99% 97%    Last Pain:  Vitals:   07/31/22 2050  TempSrc: Oral  PainSc:                  Louie Boston

## 2022-08-01 NOTE — Discharge Summary (Signed)
Physician Discharge Summary  Patient ID: Cassidy Kelly MRN: [id number] DOB/AGE: 10/27/1970 52 y.o.  Admit date: 07/31/2022 Discharge date: 08/01/22  Admission Diagnoses: acute appendicitis  Discharge Diagnoses:  Same as above  Discharged Condition: good  Hospital Course: admitted for above. Underwent surgery.  Please see op note for details.  Post op, recovered as expected.  At time of d/c, tolerating diet and pain controlled  Consults: None  Discharge Exam: Blood pressure 116/64, pulse 63, temperature 98.4 F (36.9 C), resp. rate 18, height 5\' 2"  (1.575 m), weight 95.3 kg, last menstrual period 08/01/2017, SpO2 98 %. General appearance: alert, cooperative, and no distress GI: Soft, no guarding, tenderness to palpation around incision sites as expected  Disposition:  Discharge disposition: 01-Home or Self Care       Discharge Instructions     Discharge patient   Complete by: As directed    Discharge disposition: 01-Home or Self Care   Discharge patient date: 08/01/2022      Allergies as of 08/01/2022       Reactions   Other    Cats   Pollen Extract         Medication List     TAKE these medications    acetaminophen 325 MG tablet Commonly known as: Tylenol Take 2 tablets (650 mg total) by mouth every 8 (eight) hours as needed for mild pain.   amLODipine 10 MG tablet Commonly known as: NORVASC Take 1 tablet (10 mg total) by mouth daily.   buPROPion 150 MG 24 hr tablet Commonly known as: WELLBUTRIN XL Take 1 tablet (150 mg total) by mouth daily.   cholecalciferol 25 MCG (1000 UNIT) tablet Commonly known as: VITAMIN D3 Take 1,000 Units by mouth daily.   docusate sodium 100 MG capsule Commonly known as: Colace Take 1 capsule (100 mg total) by mouth 2 (two) times daily as needed for up to 10 days for mild constipation.   FLUoxetine 10 MG capsule Commonly known as: PROZAC Take 1 capsule (10 mg total) by mouth daily.   HYDROcodone-acetaminophen  5-325 MG tablet Commonly known as: Norco Take 1 tablet by mouth every 6 (six) hours as needed for up to 6 doses for moderate pain.   losartan-hydrochlorothiazide 100-25 MG tablet Commonly known as: HYZAAR TAKE 1 TABLET BY MOUTH DAILY   meloxicam 15 MG tablet Commonly known as: MOBIC Take 15 mg by mouth daily.   multivitamin tablet Take 1 tablet by mouth daily.   scopolamine 1 MG/3DAYS Commonly known as: TRANSDERM-SCOP Place 1 patch (1.5 mg total) onto the skin every 3 (three) days.   TURMERIC PO Take by mouth.        Follow-up Information     Tonna Boehringer, Chaslyn Eisen, DO Follow up in 2 week(s).   Specialties: General Surgery, Surgery Why: follow up appendicitis Contact information: [address] [place] [address] [phone]                  Total time spent arranging discharge was >61min. Signed: Sung Amabile 08/01/2022, 1:28 PM

## 2022-08-01 NOTE — Progress Notes (Signed)
Cassidy Kelly to be D/C'd Home per MD order.  Discussed prescriptions and follow up appointments with the patient. Prescriptions given to patient, medication list explained in detail. Pt verbalized understanding.  Allergies as of 08/01/2022       Reactions   Other    Cats   Pollen Extract         Medication List     TAKE these medications    acetaminophen 325 MG tablet Commonly known as: Tylenol Take 2 tablets (650 mg total) by mouth every 8 (eight) hours as needed for mild pain.   amLODipine 10 MG tablet Commonly known as: NORVASC Take 1 tablet (10 mg total) by mouth daily.   buPROPion 150 MG 24 hr tablet Commonly known as: WELLBUTRIN XL Take 1 tablet (150 mg total) by mouth daily.   cholecalciferol 25 MCG (1000 UNIT) tablet Commonly known as: VITAMIN D3 Take 1,000 Units by mouth daily.   docusate sodium 100 MG capsule Commonly known as: Colace Take 1 capsule (100 mg total) by mouth 2 (two) times daily as needed for up to 10 days for mild constipation.   FLUoxetine 10 MG capsule Commonly known as: PROZAC Take 1 capsule (10 mg total) by mouth daily.   HYDROcodone-acetaminophen 5-325 MG tablet Commonly known as: Norco Take 1 tablet by mouth every 6 (six) hours as needed for up to 6 doses for moderate pain.   losartan-hydrochlorothiazide 100-25 MG tablet Commonly known as: HYZAAR TAKE 1 TABLET BY MOUTH DAILY   meloxicam 15 MG tablet Commonly known as: MOBIC Take 15 mg by mouth daily.   multivitamin tablet Take 1 tablet by mouth daily.   scopolamine 1 MG/3DAYS Commonly known as: TRANSDERM-SCOP Place 1 patch (1.5 mg total) onto the skin every 3 (three) days.   TURMERIC PO Take by mouth.        Vitals:   07/31/22 2201 08/01/22 0838  BP: 100/63 116/64  Pulse: 93 63  Resp: 18 18  Temp: 98.7 F (37.1 C) 98.4 F (36.9 C)  SpO2: 97% 98%    Skin clean, dry and intact without evidence of skin break down, no evidence of skin tears noted. IV catheter  discontinued intact. Site without signs and symptoms of complications. Dressing and pressure applied. Pt denies pain at this time. No complaints noted.  An After Visit Summary was printed and given to the patient. Patient escorted via WC, and D/C home via private auto.  [name] C. Jilda Roche

## 2022-08-01 NOTE — Progress Notes (Signed)
  Transition of Care (TOC) Screening Note   Patient Details  Name: Cassidy Kelly Date of Birth: [birth year]   Transition [facility]) CM/SW Contact:    Chapman Fitch, RN Phone Number: 08/01/2022, 12:47 PM    Transition of Care Department [facility]) has reviewed patient and no TOC needs have been identified at this time. We will continue to monitor patient advancement through interdisciplinary progression rounds. If new patient transition needs arise, please place a TOC consult.

## 2022-08-01 NOTE — Discharge Instructions (Signed)
Laparoscopic Appendectomy, Care After This sheet gives you information about how to care for yourself after your procedure. Your doctor may also give you more specific instructions. If you have problems or questions, contact your doctor. Follow these instructions at home: Care for cuts from surgery (incisions)  Follow instructions from your doctor about how to take care of your cuts from surgery. Make sure you: Wash your hands with soap and water before you change your bandage (dressing). If you cannot use soap and water, use hand sanitizer. Change your bandage as told by your doctor. Leave stitches (sutures), skin glue, or skin tape (adhesive) strips in place. They may need to stay in place for 2 weeks or longer. If tape strips get loose and curl up, you may trim the loose edges. Do not remove tape strips completely unless your doctor says it is okay. Do not take baths, swim, or use a hot tub until your doctor says it is okay. OK TO SHOWER 24HRS AFTER YOUR SURGERY.  Check your surgical cut area every day for signs of infection. Check for: More redness, swelling, or pain. More fluid or blood. Warmth. Pus or a bad smell. Activity Do not drive or use heavy machinery while taking prescription pain medicine. Do not play contact sports until your doctor says it is okay. Do not drive for 24 hours if you were given a medicine to help you relax (sedative). Rest as needed. Do not return to work or school until your doctor says it is okay. General instructions  tylenol as needed for discomfort.    Use narcotics, if prescribed, only when tylenol is not enough to control pain.  325-650mg  every 8hrs to max of 3000mg /24hrs (including the 325mg  in every norco dose) for the tylenol.    To prevent or treat constipation while you are taking prescription pain medicine, your doctor may recommend that you: Drink enough fluid to keep your pee (urine) clear or pale yellow. Take over-the-counter or prescription  medicines. Eat foods that are high in fiber, such as fresh fruits and vegetables, whole grains, and beans. Limit foods that are high in fat and processed sugars, such as fried and sweet foods. Contact a doctor if: You develop a rash. You have more redness, swelling, or pain around your surgical cuts. You have more fluid or blood coming from your surgical cuts. Your surgical cuts feel warm to the touch. You have pus or a bad smell coming from your surgical cuts. You have a fever. One or more of your surgical cuts breaks open. Get help right away if: You have trouble breathing. You have chest pain. You faint or feel dizzy when you stand. You have leg pain. This information is not intended to replace advice given to you by your health care provider. Make sure you discuss any questions you have with your health care provider. Document Released: 12/21/2007 Document Revised: 10/02/2015 Document Reviewed: 08/30/2015 Elsevier Interactive Patient Education  2019 [employer].

## 2022-08-02 ENCOUNTER — Telehealth: Payer: Self-pay

## 2022-08-02 LAB — SURGICAL PATHOLOGY

## 2022-08-02 NOTE — Transitions of Care (Post Inpatient/ED Visit) (Signed)
08/02/2022  Name: Cassidy Kelly MRN: [id number] DOB: 01-03-1971  Today's TOC FU Call Status: Today's TOC FU Call Status:: Successful TOC FU Call Competed TOC FU Call Complete Date: 08/02/22  Transition Care Management Follow-up Telephone Call Date of Discharge: 08/01/22 Discharge Facility: [facility]) Type of Discharge: Inpatient Admission Primary Inpatient Discharge Diagnosis:: appendicitis How have you been since you were released from the hospital?: Better Any questions or concerns?: No  Items Reviewed: Did you receive and understand the discharge instructions provided?: Yes Medications obtained,verified, and reconciled?: Yes (Medications Reviewed) Any new allergies since your discharge?: No Dietary orders reviewed?: Yes Do you have support at home?: No  Medications Reviewed Today: Medications Reviewed Today     Reviewed by Karena Addison, LPN (Licensed Practical Nurse) on 08/02/22 at 0932  Med List Status: <None>   Medication Order Taking? Sig Documenting Provider Last Dose Status Informant  acetaminophen (TYLENOL) 325 MG tablet [id number] Yes Take 2 tablets (650 mg total) by mouth every 8 (eight) hours as needed for mild pain. Sung Amabile, DO Taking Active   amLODipine (NORVASC) 10 MG tablet [id number] Yes Take 1 tablet (10 mg total) by mouth daily. Tonna Boehringer, Isami, DO Taking Active   buPROPion (WELLBUTRIN XL) 150 MG 24 hr tablet [id number] Yes Take 1 tablet (150 mg total) by mouth daily. Jacky Kindle, FNP Taking Active Self  cholecalciferol (VITAMIN D3) 25 MCG (1000 UNIT) tablet [id number] Yes Take 1,000 Units by mouth daily. [provider] Taking Active Self  docusate sodium (COLACE) 100 MG capsule [id number] Yes Take 1 capsule (100 mg total) by mouth 2 (two) times daily as needed for up to 10 days for mild constipation. Sung Amabile, DO Taking Active   FLUoxetine (PROZAC) 10 MG capsule [id number] Yes Take 1 capsule (10 mg total) by  mouth daily. Jacky Kindle, FNP Taking Active Self  HYDROcodone-acetaminophen [facility]) 5-325 MG tablet [id number] Yes Take 1 tablet by mouth every 6 (six) hours as needed for up to 6 doses for moderate pain. Sung Amabile, DO Taking Active   losartan-hydrochlorothiazide (HYZAAR) 100-25 MG tablet [id number] Yes TAKE 1 TABLET BY MOUTH DAILY Jacky Kindle, FNP Taking Active Self  meloxicam (MOBIC) 15 MG tablet [id number] Yes Take 15 mg by mouth daily. [provider] Taking Active Self  Multiple Vitamin (MULTIVITAMIN) tablet [id number] Yes Take 1 tablet by mouth daily. [provider] Taking Active Self  scopolamine (TRANSDERM-SCOP) 1 MG/3DAYS [id number] Yes Place 1 patch (1.5 mg total) onto the skin every 3 (three) days. Jacky Kindle, FNP Taking Active Self  TURMERIC PO [id number] Yes Take by mouth. [provider] Taking Active Self            Home Care and Equipment/Supplies: Were Home Health Services Ordered?: NA Any new equipment or medical supplies ordered?: NA  Functional Questionnaire: Do you need assistance with bathing/showering or dressing?: No Do you need assistance with meal preparation?: No Do you need assistance with eating?: No Do you have difficulty maintaining continence: No Do you need assistance with getting out of bed/getting out of a chair/moving?: No Do you have difficulty managing or taking your medications?: No  Follow up appointments reviewed: PCP Follow-up appointment confirmed?: NA Specialist Hospital Follow-up appointment confirmed?: Yes Date of Specialist follow-up appointment?: 08/14/22 Follow-Up Specialty Provider:: Dr Tonna Boehringer Do you need transportation to your follow-up appointment?: No Do you understand care options if your condition(s) worsen?: Yes-patient verbalized understanding    SIGNATURE Karena Addison, LPN [facility] Nurse  Health Advisor Direct Dial [phone]

## 2022-08-08 ENCOUNTER — Ambulatory Visit (INDEPENDENT_AMBULATORY_CARE_PROVIDER_SITE_OTHER): Payer: BC Managed Care – PPO | Admitting: Family Medicine

## 2022-08-08 DIAGNOSIS — R7303 Prediabetes: Secondary | ICD-10-CM

## 2022-08-08 DIAGNOSIS — E7800 Pure hypercholesterolemia, unspecified: Secondary | ICD-10-CM

## 2022-08-08 DIAGNOSIS — I10 Essential (primary) hypertension: Secondary | ICD-10-CM

## 2022-08-08 NOTE — Progress Notes (Signed)
Patient declines Transitions appt; wishes to be seen by surgery for follow up. Will complete blood work today to monitor post surgical state, pre-diabetes and HLD.  Jacky Kindle, FNP  [facility] [address] [place], [place] [address] [phone] (phone) [phone] (fax) [facility]

## 2022-08-09 LAB — LIPID PANEL
Chol/HDL Ratio: 4.4 ratio (ref 0.0–4.4)
Cholesterol, Total: 240 mg/dL — ABNORMAL HIGH (ref 100–199)
HDL: 55 mg/dL (ref >39)
LDL Chol Calc (NIH): 154 mg/dL — ABNORMAL HIGH (ref 0–99)
Triglycerides: 174 mg/dL — ABNORMAL HIGH (ref 0–149)
VLDL Cholesterol Cal: 31 mg/dL (ref 5–40)

## 2022-08-09 LAB — CBC WITH DIFFERENTIAL/PLATELET
Basophils Absolute: 0.1 10*3/uL (ref 0.0–0.2)
Basos: 1 %
EOS (ABSOLUTE): 0.2 10*3/uL (ref 0.0–0.4)
Eos: 2 %
Hematocrit: 35.5 % (ref 34.0–46.6)
Hemoglobin: 11.7 g/dL (ref 11.1–15.9)
Immature Grans (Abs): 0.1 10*3/uL (ref 0.0–0.1)
Immature Granulocytes: 1 %
Lymphocytes Absolute: 3 10*3/uL (ref 0.7–3.1)
Lymphs: 38 %
MCH: 29.8 pg (ref 26.6–33.0)
MCHC: 33 g/dL (ref 31.5–35.7)
MCV: 90 fL (ref 79–97)
Monocytes Absolute: 0.5 10*3/uL (ref 0.1–0.9)
Monocytes: 6 %
Neutrophils Absolute: 4.1 10*3/uL (ref 1.4–7.0)
Neutrophils: 52 %
Platelets: 284 10*3/uL (ref 150–450)
RBC: 3.93 x10E6/uL (ref 3.77–5.28)
RDW: 12.8 % (ref 11.7–15.4)
WBC: 7.9 10*3/uL (ref 3.4–10.8)

## 2022-08-09 LAB — COMPREHENSIVE METABOLIC PANEL
ALT: 17 IU/L (ref 0–32)
AST: 12 IU/L (ref 0–40)
Albumin/Globulin Ratio: 1.5 (ref 1.2–2.2)
Albumin: 4.3 g/dL (ref 3.8–4.9)
Alkaline Phosphatase: 112 IU/L (ref 44–121)
BUN/Creatinine Ratio: 15 (ref 9–23)
BUN: 15 mg/dL (ref 6–24)
Bilirubin Total: <0.2 mg/dL (ref 0.0–1.2)
CO2: 21 mmol/L (ref 20–29)
Calcium: 9.2 mg/dL (ref 8.7–10.2)
Chloride: 104 mmol/L (ref 96–106)
Creatinine, Ser: 0.97 mg/dL (ref 0.57–1.00)
Globulin, Total: 2.8 g/dL (ref 1.5–4.5)
Glucose: 127 mg/dL — ABNORMAL HIGH (ref 70–99)
Potassium: 3.9 mmol/L (ref 3.5–5.2)
Sodium: 141 mmol/L (ref 134–144)
Total Protein: 7.1 g/dL (ref 6.0–8.5)
eGFR: 70 mL/min/{1.73_m2} (ref >59)

## 2022-08-09 LAB — HEMOGLOBIN A1C
Est. average glucose Bld gHb Est-mCnc: 131 mg/dL
Hgb A1c MFr Bld: 6.2 % — ABNORMAL HIGH (ref 4.8–5.6)

## 2022-08-09 NOTE — Progress Notes (Signed)
Stable A1c showing pre-diabetes. Continue to recommend balanced, lower carb meals. Smaller meal size, adding snacks. Choosing water as drink of choice and increasing purposeful exercise. Blood chemistry remains stable/normal.  Cholesterol remains unchanged; elevated total and bad/LDL cholesterol. The 10-year ASCVD risk score (Arnett DK, et al., 2019) is: 3.5%. I continue to recommend diet low in saturated fat and regular exercise - 30 min at least 5 times per week.  Stable/normal cell counts.

## 2022-08-17 ENCOUNTER — Encounter: Payer: Self-pay | Admitting: Family Medicine

## 2022-08-17 ENCOUNTER — Ambulatory Visit: Payer: BC Managed Care – PPO | Admitting: Family Medicine

## 2022-08-17 VITALS — BP 117/70 | HR 71 | Ht 62.0 in | Wt 213.4 lb

## 2022-08-17 DIAGNOSIS — R7303 Prediabetes: Secondary | ICD-10-CM

## 2022-08-17 DIAGNOSIS — I10 Essential (primary) hypertension: Secondary | ICD-10-CM

## 2022-08-17 DIAGNOSIS — E7800 Pure hypercholesterolemia, unspecified: Secondary | ICD-10-CM | POA: Diagnosis not present

## 2022-08-17 MED ORDER — ROSUVASTATIN CALCIUM 5 MG PO TABS
5.0000 mg | ORAL_TABLET | Freq: Every day | ORAL | 1 refills | Status: DC
Start: 2022-08-17 — End: 2023-01-11

## 2022-08-17 MED ORDER — METFORMIN HCL ER 750 MG PO TB24: 750.0000 mg | ORAL_TABLET | Freq: Two times a day (BID) | ORAL | 1 refills | Status: DC

## 2022-08-17 NOTE — Assessment & Plan Note (Signed)
Chronic, stable Remains on both  -norvasc 10 mg -hyzaar 100-25

## 2022-08-17 NOTE — Progress Notes (Signed)
I,Sha'taria Tyson,acting as a Neurosurgeon for Jacky Kindle, FNP.,have documented all relevant documentation on the behalf of Jacky Kindle, FNP,as directed by  Jacky Kindle, FNP while in the presence of Jacky Kindle, FNP.   Established patient visit  Patient: Cassidy Kelly   DOB: 1970/08/06   52 y.o. Female  MRN: [id number] Visit Date: 08/17/2022  Today's healthcare provider: Jacky Kindle, FNP  Introduced to nurse practitioner role and practice setting.  All questions answered.  Discussed provider/patient relationship and expectations.  Subjective    HPI  Patient would like to further discuss her recent lab results. States she would like to figure out a plan for her pre-diabetes and cholesterol.   Hyperlipidemia: Patient presents with hyperlipidemia.  She was tested because hypercholesterolemia.  Her last labs showed Total cholesterol of 240, HDL 55, LDL 154,  Triglycerides 174. fatigue and lower extremity edema. There is not a family history of hyperlipidemia. There is not a family history of early ischemia heart disease.  Diabetes Mellitus Patient presents  for follow up of pre-diabetes  Current symptoms include: none. Symptoms have been basically asymptomatic. Patient denies foot ulcerations, hyperglycemia, hypoglycemia , increased appetite, nausea, paresthesia of the feet, polydipsia, polyuria, visual disturbances, vomiting, and weight loss. Evaluation to date has included: hemoglobin A1C.  Home sugars: patient does not check sugars. Current treatment: none. Last dilated eye exam: n/a.   Medications: Outpatient Medications Prior to Visit  Medication Sig   busPIRone (BUSPAR) 7.5 MG tablet Take 7.5 mg by mouth 2 (two) times daily.   acetaminophen (TYLENOL) 325 MG tablet Take 2 tablets (650 mg total) by mouth every 8 (eight) hours as needed for mild pain.   amLODipine (NORVASC) 10 MG tablet Take 1 tablet (10 mg total) by mouth daily.   buPROPion (WELLBUTRIN XL) 150 MG 24 hr tablet  Take 1 tablet (150 mg total) by mouth daily.   cholecalciferol (VITAMIN D3) 25 MCG (1000 UNIT) tablet Take 1,000 Units by mouth daily.   FLUoxetine (PROZAC) 10 MG capsule Take 1 capsule (10 mg total) by mouth daily.   HYDROcodone-acetaminophen (NORCO) 5-325 MG tablet Take 1 tablet by mouth every 6 (six) hours as needed for up to 6 doses for moderate pain.   levocetirizine (XYZAL) 5 MG tablet Take 5 mg by mouth every evening.   losartan-hydrochlorothiazide (HYZAAR) 100-25 MG tablet TAKE 1 TABLET BY MOUTH DAILY   meloxicam (MOBIC) 15 MG tablet Take 15 mg by mouth daily.   Multiple Vitamin (MULTIVITAMIN) tablet Take 1 tablet by mouth daily.   scopolamine (TRANSDERM-SCOP) 1 MG/3DAYS Place 1 patch (1.5 mg total) onto the skin every 3 (three) days.   TURMERIC PO Take by mouth.   No facility-administered medications prior to visit.   Review of Systems    Objective    BP 117/70 (BP Location: Right Arm, Patient Position: Sitting, Cuff Size: Large)   Pulse 71   Ht 5\' 2"  (1.575 m)   Wt 213 lb 6.4 oz (96.8 kg)   LMP 08/01/2017   SpO2 100%   BMI 39.03 kg/m   Physical Exam Vitals and nursing note reviewed.  Constitutional:      General: She is not in acute distress.    Appearance: Normal appearance. She is obese. She is not ill-appearing, toxic-appearing or diaphoretic.  HENT:     Head: Normocephalic and atraumatic.  Cardiovascular:     Rate and Rhythm: Normal rate and regular rhythm.     Pulses: Normal pulses.  Heart sounds: Normal heart sounds. No murmur heard.    No friction rub. No gallop.  Pulmonary:     Effort: Pulmonary effort is normal. No respiratory distress.     Breath sounds: Normal breath sounds. No stridor. No wheezing, rhonchi or rales.  Chest:     Chest wall: No tenderness.  Abdominal:     General: Bowel sounds are normal.     Palpations: Abdomen is soft.     Tenderness: There is abdominal tenderness.     Comments: Improving tenderness s/p surgery; continues to  have some fatigue  Musculoskeletal:        General: No swelling, tenderness, deformity or signs of injury. Normal range of motion.     Right lower leg: No edema.     Left lower leg: No edema.  Skin:    General: Skin is warm and dry.     Capillary Refill: Capillary refill takes less than 2 seconds.     Coloration: Skin is not jaundiced or pale.     Findings: No bruising, erythema, lesion or rash.  Neurological:     General: No focal deficit present.     Mental Status: She is alert and oriented to person, place, and time. Mental status is at baseline.     Cranial Nerves: No cranial nerve deficit.     Sensory: No sensory deficit.     Motor: No weakness.     Coordination: Coordination normal.  Psychiatric:        Mood and Affect: Mood normal.        Behavior: Behavior normal.        Thought Content: Thought content normal.        Judgment: Judgment normal.     No results found for any visits on 08/17/22.  Assessment & Plan     Problem List Items Addressed This Visit       Cardiovascular and Mediastinum   Essential hypertension - Primary    Chronic, stable Remains on both  -norvasc 10 mg -hyzaar 100-25      Relevant Medications   rosuvastatin (CRESTOR) 5 MG tablet     Other   Hypercholesterolemia    Chronic, elevated Family hx of ASCVD; wishes to start on medication to assist overall health and help her "live longer" recommend diet low in saturated fat and regular exercise - 30 min at least 5 times per week       Relevant Medications   rosuvastatin (CRESTOR) 5 MG tablet   Morbid obesity (HCC)    Chronic, stable Body mass index is 39.03 kg/m. Associated with HTN, pre-diabetes, HLD Discussed importance of healthy weight management Discussed diet and exercise       Relevant Medications   metFORMIN (GLUCOPHAGE-XR) 750 MG 24 hr tablet   Pre-diabetes    Chronic, stable Will start metformin 750 mg once daily with plan to increase to twice daily dosing Educated  on GI side effects Repeat A1c and next visit in 3 months Continue to recommend balanced, lower carb meals. Smaller meal size, adding snacks. Choosing water as drink of choice and increasing purposeful exercise.       Return in about 3 months (around 11/17/2022) for chonic disease management.     Leilani Merl, FNP, have reviewed all documentation for this visit. The documentation on 08/17/22 for the exam, diagnosis, procedures, and orders are all accurate and complete.  Jacky Kindle, FNP  [facility] [phone] (phone) [phone] (fax)  [facility]

## 2022-08-17 NOTE — Assessment & Plan Note (Signed)
Chronic, elevated Family hx of ASCVD; wishes to start on medication to assist overall health and help her "live longer" recommend diet low in saturated fat and regular exercise - 30 min at least 5 times per week

## 2022-08-17 NOTE — Patient Instructions (Signed)
The 10-year ASCVD risk score (Arnett DK, et al., 2019) is: 3.6%   Values used to calculate the score:     Age: 52 years     Sex: Female     Is Non-Hispanic African American: Yes     Diabetic: No     Tobacco smoker: No     Systolic Blood Pressure: 117 mmHg     Is BP treated: Yes     HDL Cholesterol: 55 mg/dL     Total Cholesterol: 240 mg/dL

## 2022-08-17 NOTE — Assessment & Plan Note (Signed)
Chronic, stable Will start metformin 750 mg once daily with plan to increase to twice daily dosing Educated on GI side effects Repeat A1c and next visit in 3 months Continue to recommend balanced, lower carb meals. Smaller meal size, adding snacks. Choosing water as drink of choice and increasing purposeful exercise.

## 2022-08-17 NOTE — Assessment & Plan Note (Signed)
Chronic, stable Body mass index is 39.03 kg/m. Associated with HTN, pre-diabetes, HLD Discussed importance of healthy weight management Discussed diet and exercise

## 2022-09-04 ENCOUNTER — Ambulatory Visit: Payer: BC Managed Care – PPO | Attending: Otolaryngology

## 2022-09-04 DIAGNOSIS — G4733 Obstructive sleep apnea (adult) (pediatric): Secondary | ICD-10-CM | POA: Diagnosis not present

## 2022-09-04 DIAGNOSIS — R5383 Other fatigue: Secondary | ICD-10-CM | POA: Diagnosis present

## 2022-09-04 DIAGNOSIS — F339 Major depressive disorder, recurrent, unspecified: Secondary | ICD-10-CM | POA: Insufficient documentation

## 2022-09-04 DIAGNOSIS — Z6838 Body mass index (BMI) 38.0-38.9, adult: Secondary | ICD-10-CM | POA: Diagnosis not present

## 2022-09-04 DIAGNOSIS — R7303 Prediabetes: Secondary | ICD-10-CM | POA: Insufficient documentation

## 2022-09-08 ENCOUNTER — Telehealth: Payer: Self-pay | Admitting: Family Medicine

## 2022-09-08 NOTE — Telephone Encounter (Signed)
Pt is calling to report that she contacted [facility] and was advised to contact PCP office. In MyChart it was noted that the pt did not show for her appt. Pt states that she did show for this appt on 09/04/22. Please advise CB- [phone]

## 2022-09-12 ENCOUNTER — Telehealth (HOSPITAL_BASED_OUTPATIENT_CLINIC_OR_DEPARTMENT_OTHER): Payer: BC Managed Care – PPO | Admitting: Pulmonary Disease

## 2022-09-12 DIAGNOSIS — G4733 Obstructive sleep apnea (adult) (pediatric): Secondary | ICD-10-CM

## 2022-09-12 NOTE — Telephone Encounter (Signed)
Results routed to referring provider.  Will close encounter

## 2022-09-12 NOTE — Telephone Encounter (Signed)
NPSG  showed severe OSA with AHI 60/ hr & low sat of 74% -corrected by  autoCPAP  10-13 cm, with med nasal pillows

## 2022-09-19 ENCOUNTER — Telehealth: Payer: Self-pay

## 2022-09-19 NOTE — Telephone Encounter (Unsigned)
Copied from CRM [id number]. Topic: General - Inquiry >> Sep 18, 2022  4:31 PM Marlow Baars wrote: Reason for CRM: The patient called in wanting to know if her provider has looked at her sleep study yet and would like a call as soon as possible regarding that.

## 2022-09-19 NOTE — Telephone Encounter (Signed)
OK for PCP to review when she is in the office next week. Please advise patient that it will be reviewed then as PCP is out of office.

## 2022-09-20 NOTE — Telephone Encounter (Signed)
Detailed VM left per DPR.  

## 2022-09-20 NOTE — Telephone Encounter (Signed)
CRM created. Ok for P[facility] to advise 

## 2022-09-27 ENCOUNTER — Other Ambulatory Visit: Payer: Self-pay | Admitting: Family Medicine

## 2022-09-27 DIAGNOSIS — G4730 Sleep apnea, unspecified: Secondary | ICD-10-CM

## 2022-09-29 ENCOUNTER — Encounter: Payer: Self-pay | Admitting: Family Medicine

## 2022-09-29 NOTE — Telephone Encounter (Signed)
Orders were faxed today 

## 2022-10-04 ENCOUNTER — Other Ambulatory Visit: Payer: Self-pay | Admitting: Family Medicine

## 2022-10-04 DIAGNOSIS — I10 Essential (primary) hypertension: Secondary | ICD-10-CM

## 2022-10-05 NOTE — Telephone Encounter (Signed)
Requested Prescriptions  Pending Prescriptions Disp Refills   amLODipine (NORVASC) 10 MG tablet [Pharmacy Med Name: AMLODIPINE BESYLATE 10MG  TABLETS] 90 tablet     Sig: TAKE 1 TABLET(10 MG) BY MOUTH DAILY     Cardiovascular: Calcium Channel Blockers 2 Passed - 10/04/2022  7:05 PM      Passed - Last BP in normal range    BP Readings from Last 1 Encounters:  08/17/22 117/70         Passed - Last Heart Rate in normal range    Pulse Readings from Last 1 Encounters:  08/17/22 71         Passed - Valid encounter within last 6 months    Recent Outpatient Visits           1 month ago Essential hypertension   [place] [facility] Jacky Kindle, FNP   1 month ago Pre-diabetes   [facility] Merita Norton T, FNP   3 months ago Fatigue, unspecified type   [facility] Merita Norton T, FNP   5 months ago Screening mammogram for breast cancer   [place] [facility] Merita Norton T, FNP   9 months ago Viral URI   [place] [facility] [place], [place], PA-C       Future Appointments             In 4 weeks Jacky Kindle, FNP [facility], [facility]   In 1 month Jacky Kindle, FNP [facility], [facility]             losartan-hydrochlorothiazide (HYZAAR) 100-25 MG tablet [Pharmacy Med Name: LOSARTAN/HCTZ 100/25MG  TABLETS] 90 tablet 1    Sig: TAKE 1 TABLET BY MOUTH DAILY     Cardiovascular: ARB + Diuretic Combos Passed - 10/04/2022  7:05 PM      Passed - K in normal range and within 180 days    Potassium  Date Value Ref Range Status  08/08/2022 3.9 3.5 - 5.2 mmol/L Final         Passed - Na in normal range and within 180 days    Sodium  Date Value Ref Range Status  08/08/2022 141 134 - 144 mmol/L Final         Passed - Cr in normal range and within 180 days    Creat  Date Value Ref Range Status  10/11/2015 0.94 0.50 - 1.10 mg/dL  Final   Creatinine, Ser  Date Value Ref Range Status  08/08/2022 0.97 0.57 - 1.00 mg/dL Final         Passed - eGFR is 10 or above and within 180 days    GFR, Est African American  Date Value Ref Range Status  10/11/2015 84 >=60 mL/min Final   GFR calc Af Amer  Date Value Ref Range Status  03/15/2020 85 >59 mL/min/1.73 Final    Comment:    **In accordance with recommendations from the NKF-ASN Task force,**   [facility] is in the process of updating its eGFR calculation to the   2021 CKD-EPI creatinine equation that estimates kidney function   without a race variable.    GFR, Est Non African American  Date Value Ref Range Status  10/11/2015 73 >=60 mL/min Final   GFR, Estimated  Date Value Ref Range Status  07/31/2022 >60 >60 mL/min Final    Comment:    (NOTE) Calculated using the CKD-EPI Creatinine Equation (2021)  Performed at [facility], [address] [place], [place] [address]    eGFR  Date Value Ref Range Status  08/08/2022 70 >59 mL/min/1.73 Final         Passed - Patient is not pregnant      Passed - Last BP in normal range    BP Readings from Last 1 Encounters:  08/17/22 117/70         Passed - Valid encounter within last 6 months    Recent Outpatient Visits           1 month ago Essential hypertension   [place] [facility] Jacky Kindle, FNP   1 month ago Pre-diabetes   [facility] Mer[facility] Norton T, FNP   3 months ago Fatigue, unspecified type   [facility] Jacky Kindle, FNP   5 months ago Screening mammogram for breast cancer   [place] Jacky Kindle, FNP   9 months ago Viral URI   [place] [facility] [place], [place], PA-C       Future Appointments             In 4 weeks Jacky Kindle, FNP [facility], [facility]   In 1 month Suzie Portela, Daryl Eastern, FNP [facility], [facility]

## 2022-10-05 NOTE — Telephone Encounter (Signed)
Requested medications are due for refill today.  unsure  Requested medications are on the active medications list.  yes  Last refill. 08/01/2022   Future visit scheduled.   yes  Notes to clinic.  Rx signed by Sung Amabile.     Requested Prescriptions  Pending Prescriptions Disp Refills   amLODipine (NORVASC) 10 MG tablet [Pharmacy Med Name: AMLODIPINE BESYLATE 10MG  TABLETS] 90 tablet     Sig: TAKE 1 TABLET(10 MG) BY MOUTH DAILY     Cardiovascular: Calcium Channel Blockers 2 Passed - 10/04/2022  7:05 PM      Passed - Last BP in normal range    BP Readings from Last 1 Encounters:  08/17/22 117/70         Passed - Last Heart Rate in normal range    Pulse Readings from Last 1 Encounters:  08/17/22 71         Passed - Valid encounter within last 6 months    Recent Outpatient Visits           1 month ago Essential hypertension   [place] [facility] Merita Norton T, FNP   1 month ago Pre-diabetes   [facility] Merita Norton T, FNP   3 months ago Fatigue, unspecified type   [facility] Merita Norton T, FNP   5 months ago Screening mammogram for breast cancer    [place] [facility] Merita Norton T, FNP   9 months ago Viral URI   [place] [facility] [place], [place], PA-C       Future Appointments             In 4 weeks Jacky Kindle, FNP [facility], [facility]   In 1 month Jacky Kindle, FNP [place] [facility], [facility]            Signed Prescriptions Disp Refills   losartan-hydrochlorothiazide (HYZAAR) 100-25 MG tablet 90 tablet 1    Sig: TAKE 1 TABLET BY MOUTH DAILY     Cardiovascular: ARB + Diuretic Combos Passed - 10/04/2022  7:05 PM      Passed - K in normal range and within 180 days    Potassium  Date Value Ref Range Status  08/08/2022 3.9 3.5 - 5.2 mmol/L Final         Passed - Na in normal range and within 180 days     Sodium  Date Value Ref Range Status  08/08/2022 141 134 - 144 mmol/L Final         Passed - Cr in normal range and within 180 days    Creat  Date Value Ref Range Status  10/11/2015 0.94 0.50 - 1.10 mg/dL Final   Creatinine, Ser  Date Value Ref Range Status  08/08/2022 0.97 0.57 - 1.00 mg/dL Final         Passed - eGFR is 10 or above and within 180 days    GFR, Est African American  Date Value Ref Range Status  10/11/2015 84 >=60 mL/min Final   GFR calc Af Amer  Date Value Ref Range Status  03/15/2020 85 >59 mL/min/1.73 Final    Comment:    **In accordance with recommendations from the NKF-ASN Task force,**   [facility] is in the process of updating its eGFR calculation to the   2021 CKD-EPI creatinine equation that estimates kidney function   without a race variable.    GFR, Est Non  African American  Date Value Ref Range Status  10/11/2015 73 >=60 mL/min Final   GFR, Estimated  Date Value Ref Range Status  07/31/2022 >60 >60 mL/min Final    Comment:    (NOTE) Calculated using the CKD-EPI Creatinine Equation (2021) Performed at [facility], [address] [place], [place] [address]    eGFR  Date Value Ref Range Status  08/08/2022 70 >59 mL/min/1.73 Final         Passed - Patient is not pregnant      Passed - Last BP in normal range    BP Readings from Last 1 Encounters:  08/17/22 117/70         Passed - Valid encounter within last 6 months    Recent Outpatient Visits           1 month ago Essential hypertension   [place] [facility] Jacky Kindle, FNP   1 month ago Pre-diabetes   [facility] Mer[facility] Norton T, FNP   3 months ago Fatigue, unspecified type   [facility] Jack[facility], FNP   5 months ago Screening mammogram for breast cancer   [facility] Jacky Kindle, FNP   9 months ago Viral URI   [place] [facility]  [place], [place], PA-C       Future Appointments             In 4 weeks Jacky Kindle, FNP [facility], [facility]   In 1 month Suzie Portela, Daryl Eastern, FNP [facility], [facility]

## 2022-10-11 ENCOUNTER — Telehealth: Payer: Self-pay | Admitting: Family Medicine

## 2022-10-11 NOTE — Telephone Encounter (Signed)
Pt is calling in requesting more information regarding the sleep study she did earlier in the month. Pt says she was supposed to be contacted about getting a device for Sleep Apnea but hasn't heard anything. Please follow up with pt.

## 2022-10-11 NOTE — Telephone Encounter (Signed)
DME order was faxed on 10/02/22 to [facility]. They did call her and left her a message. Patient given phone number to call. [phone].

## 2022-10-30 ENCOUNTER — Other Ambulatory Visit (HOSPITAL_COMMUNITY)
Admission: RE | Admit: 2022-10-30 | Discharge: 2022-10-30 | Disposition: A | Discharge status: Home / Self Care | Payer: BC Managed Care – PPO | Source: Ambulatory Visit | Attending: Family Medicine | Admitting: Family Medicine

## 2022-10-30 ENCOUNTER — Encounter: Payer: Self-pay | Admitting: Family Medicine

## 2022-10-30 ENCOUNTER — Ambulatory Visit (INDEPENDENT_AMBULATORY_CARE_PROVIDER_SITE_OTHER): Payer: BC Managed Care – PPO | Admitting: Family Medicine

## 2022-10-30 VITALS — BP 123/59 | HR 54 | Temp 98.4°F | Ht 62.0 in | Wt 216.8 lb

## 2022-10-30 DIAGNOSIS — G4733 Obstructive sleep apnea (adult) (pediatric): Secondary | ICD-10-CM | POA: Insufficient documentation

## 2022-10-30 DIAGNOSIS — R7303 Prediabetes: Secondary | ICD-10-CM

## 2022-10-30 DIAGNOSIS — E7800 Pure hypercholesterolemia, unspecified: Secondary | ICD-10-CM

## 2022-10-30 DIAGNOSIS — I10 Essential (primary) hypertension: Secondary | ICD-10-CM

## 2022-10-30 DIAGNOSIS — Z0000 Encounter for general adult medical examination without abnormal findings: Secondary | ICD-10-CM | POA: Diagnosis not present

## 2022-10-30 DIAGNOSIS — Z124 Encounter for screening for malignant neoplasm of cervix: Secondary | ICD-10-CM

## 2022-10-30 DIAGNOSIS — G4730 Sleep apnea, unspecified: Secondary | ICD-10-CM | POA: Insufficient documentation

## 2022-10-30 DIAGNOSIS — F339 Major depressive disorder, recurrent, unspecified: Secondary | ICD-10-CM | POA: Diagnosis not present

## 2022-10-30 NOTE — Assessment & Plan Note (Signed)
Pap completed; no complaints

## 2022-10-30 NOTE — Assessment & Plan Note (Signed)
Chronic, stable Body mass index is 39.65 kg/m. Discussed importance of healthy weight management Discussed diet and exercise

## 2022-10-30 NOTE — Assessment & Plan Note (Signed)

## 2022-10-30 NOTE — Assessment & Plan Note (Signed)
Chronic, stable Repeat LP Previously on crestor 5 mg

## 2022-10-30 NOTE — Progress Notes (Signed)
Complete physical exam   Patient: Cassidy Kelly   DOB: [birth year]   52 y.o. Female  MRN: [id number] Visit Date: 10/30/2022  Today's healthcare provider: Jacky Kindle, FNP  Introduced to nurse practitioner role and practice setting.  All questions answered.  Discussed provider/patient relationship and expectations.  Chief Complaint  Patient presents with   Annual Exam    Patient has concerns for weight, loss of energy, would like PAP today    Subjective    Cassidy Kelly is a 52 y.o. female who presents today for a complete physical exam.  She reports consuming a general diet. The patient does not participate in regular exercise at present. She generally feels fairly well. She reports sleeping fairly well. She does have additional problems to discuss today.  HPI HPI     Annual Exam    Additional comments: Patient has concerns for weight, loss of energy, would like PAP today       Last edited by Rolly Salter, CMA on 10/30/2022  3:36 PM.       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   Hypercholesterolemia    Hypertension    Obesity (BMI 30-39.9)    Past Surgical History:  Procedure Laterality Date   COLONOSCOPY WITH PROPOFOL N/A 09/30/2018   Procedure: COLONOSCOPY WITH PROPOFOL;  Surgeon: Wyline Mood, MD;  Location: [facility] ENDOSCOPY;  Service: Gastroenterology;  Laterality: N/A;   WISDOM TOOTH EXTRACTION     AGE 55; ALL FOUR   XI ROBOTIC LAPAROSCOPIC ASSISTED APPENDECTOMY N/A 07/31/2022   Procedure: XI ROBOTIC LAPAROSCOPIC ASSISTED APPENDECTOMY;  Surgeon: Sung Amabile, DO;  Location: [facility] ORS;  Service: General;  Laterality: N/A;   Social History   Socioeconomic History   Marital status: Divorced    Spouse name: Not on file   Number of children: 1   Years of education: 16   Highest education level: Not on file  Occupational History   Occupation: Pension scheme manager  Tobacco Use   Smoking status: Never    Smokeless tobacco: Never  Vaping Use   Vaping status: Never Used  Substance and Sexual Activity   Alcohol use: Yes    Comment: OCC   Drug use: No   Sexual activity: Yes    Partners: Male    Comment: DEPO  Other Topics Concern   Not on file  Social History Narrative   Not on file   Social Determinants of Health   Financial Resource Strain: Not on file  Food Insecurity: No Food Insecurity (07/31/2022)   Hunger Vital Sign    Worried About Running Out of Food in the Last Year: Never true    Ran Out of Food in the Last Year: Never true  Transportation Needs: No Transportation Needs (07/31/2022)   PRAPARE - Administrator, Civil Service (Medical): No    Lack of Transportation (Non-Medical): No  Physical Activity: Sufficiently Active (09/23/2018)   Exercise Vital Sign    Days of Exercise per Week: 5 days    Minutes of Exercise per Session: 60 min  Stress: Stress Concern Present (07/17/2017)   [employer] of Occupational Health - Occupational Stress Questionnaire    Feeling of Stress : Rather much  Social Connections: Not on file  Intimate Partner Violence: Patient Unable To Answer (07/31/2022)   Humiliation, Afraid, Rape, and Kick questionnaire    Fear of Current or Ex-Partner: Patient unable to answer  Emotionally Abused: Patient unable to answer    Physically Abused: Patient unable to answer    Sexually Abused: Patient unable to answer   Family Status  Relation Name Status   Mother  (Not Specified)   MGM  (Not Specified)   MGF  (Not Specified)   PGF  (Not Specified)  No partnership data on file   Family History  Problem Relation Age of Onset   Diabetes Mother    Diabetes type II Maternal Grandmother    Diabetes type II Maternal Grandfather    Heart disease Paternal Grandfather        MIs in 2s   Allergies  Allergen Reactions   Other     Cats   Pollen Extract     Patient Care Team: Jacky Kindle, FNP as PCP - General (Family Medicine)    Medications: Outpatient Medications Prior to Visit  Medication Sig   amLODipine (NORVASC) 10 MG tablet TAKE 1 TABLET(10 MG) BY MOUTH DAILY   buPROPion (WELLBUTRIN XL) 150 MG 24 hr tablet Take 1 tablet (150 mg total) by mouth daily.   busPIRone (BUSPAR) 7.5 MG tablet Take 7.5 mg by mouth 2 (two) times daily.   cholecalciferol (VITAMIN D3) 25 MCG (1000 UNIT) tablet Take 1,000 Units by mouth daily.   FLUoxetine (PROZAC) 10 MG capsule Take 1 capsule (10 mg total) by mouth daily.   levocetirizine (XYZAL) 5 MG tablet Take 5 mg by mouth every evening.   losartan-hydrochlorothiazide (HYZAAR) 100-25 MG tablet TAKE 1 TABLET BY MOUTH DAILY   metFORMIN (GLUCOPHAGE-XR) 750 MG 24 hr tablet Take 1 tablet (750 mg total) by mouth 2 (two) times daily before a meal.   Multiple Vitamin (MULTIVITAMIN) tablet Take 1 tablet by mouth daily.   rosuvastatin (CRESTOR) 5 MG tablet Take 1 tablet (5 mg total) by mouth daily.   scopolamine (TRANSDERM-SCOP) 1 MG/3DAYS Place 1 patch (1.5 mg total) onto the skin every 3 (three) days.   TURMERIC PO Take by mouth.   [DISCONTINUED] HYDROcodone-acetaminophen (NORCO) 5-325 MG tablet Take 1 tablet by mouth every 6 (six) hours as needed for up to 6 doses for moderate pain.   [DISCONTINUED] meloxicam (MOBIC) 15 MG tablet Take 15 mg by mouth daily.   No facility-administered medications prior to visit.    Review of Systems Last CBC Lab Results  Component Value Date   WBC 7.9 08/08/2022   HGB 11.7 08/08/2022   HCT 35.5 08/08/2022   MCV 90 08/08/2022   MCH 29.8 08/08/2022   RDW 12.8 08/08/2022   PLT 284 08/08/2022   Last metabolic panel Lab Results  Component Value Date   GLUCOSE 127 (H) 08/08/2022   NA 141 08/08/2022   K 3.9 08/08/2022   CL 104 08/08/2022   CO2 21 08/08/2022   BUN 15 08/08/2022   CREATININE 0.97 08/08/2022   EGFR 70 08/08/2022   CALCIUM 9.2 08/08/2022   PROT 7.1 08/08/2022   ALBUMIN 4.3 08/08/2022   LABGLOB 2.8 08/08/2022   AGRATIO 1.5  08/08/2022   BILITOT <0.2 08/08/2022   ALKPHOS 112 08/08/2022   AST 12 08/08/2022   ALT 17 08/08/2022   ANIONGAP 10 07/31/2022   Last lipids Lab Results  Component Value Date   CHOL 240 (H) 08/08/2022   HDL 55 08/08/2022   LDLCALC 154 (H) 08/08/2022   TRIG 174 (H) 08/08/2022   CHOLHDL 4.4 08/08/2022   Last hemoglobin A1c Lab Results  Component Value Date   HGBA1C 6.2 (H) 08/08/2022  Last thyroid functions Lab Results  Component Value Date   TSH 2.620 05/08/2022     Objective    BP (!) 123/59 (BP Location: Left Arm, Patient Position: Sitting, Cuff Size: Large)   Pulse (!) 54   Temp 98.4 F (36.9 C) (Oral)   Ht 5\' 2"  (1.575 m)   Wt 216 lb 12.8 oz (98.3 kg)   LMP 08/01/2017   SpO2 100%   BMI 39.65 kg/m  BP Readings from Last 3 Encounters:  10/30/22 (!) 123/59  08/17/22 117/70  08/01/22 116/64   Wt Readings from Last 3 Encounters:  10/30/22 216 lb 12.8 oz (98.3 kg)  08/17/22 213 lb 6.4 oz (96.8 kg)  07/31/22 210 lb 1.6 oz (95.3 kg)   Physical Exam Vitals and nursing note reviewed.  Constitutional:      General: She is awake. She is not in acute distress.    Appearance: Normal appearance. She is well-developed and well-groomed. She is obese. She is not ill-appearing, toxic-appearing or diaphoretic.  HENT:     Head: Normocephalic and atraumatic.     Jaw: There is normal jaw occlusion. No trismus, tenderness, swelling or pain on movement.     Right Ear: Hearing, tympanic membrane, ear canal and external ear normal. There is no impacted cerumen.     Left Ear: Hearing, tympanic membrane, ear canal and external ear normal. There is no impacted cerumen.     Nose: Nose normal. No congestion or rhinorrhea.     Right Turbinates: Not enlarged, swollen or pale.     Left Turbinates: Not enlarged, swollen or pale.     Right Sinus: No maxillary sinus tenderness or frontal sinus tenderness.     Left Sinus: No maxillary sinus tenderness or frontal sinus tenderness.      Mouth/Throat:     Lips: Pink.     Mouth: Mucous membranes are moist. No injury.     Tongue: No lesions.     Pharynx: Oropharynx is clear. Uvula midline. No pharyngeal swelling, oropharyngeal exudate, posterior oropharyngeal erythema or uvula swelling.     Tonsils: No tonsillar exudate or tonsillar abscesses.  Eyes:     General: Lids are normal. Lids are everted, no foreign bodies appreciated. Vision grossly intact. Gaze aligned appropriately. No allergic shiner or visual field deficit.       Right eye: No discharge.        Left eye: No discharge.     Extraocular Movements: Extraocular movements intact.     Conjunctiva/sclera: Conjunctivae normal.     Right eye: Right conjunctiva is not injected. No exudate.    Left eye: Left conjunctiva is not injected. No exudate.    Pupils: Pupils are equal, round, and reactive to light.  Neck:     Thyroid: No thyroid mass, thyromegaly or thyroid tenderness.     Vascular: No carotid bruit.     Trachea: Trachea normal.  Cardiovascular:     Rate and Rhythm: Normal rate and regular rhythm.     Pulses: Normal pulses.          Carotid pulses are 2+ on the right side and 2+ on the left side.      Radial pulses are 2+ on the right side and 2+ on the left side.       Dorsalis pedis pulses are 2+ on the right side and 2+ on the left side.       Posterior tibial pulses are 2+ on the right side and 2+ on the  left side.     Heart sounds: Normal heart sounds, S1 normal and S2 normal. No murmur heard.    No friction rub. No gallop.  Pulmonary:     Effort: Pulmonary effort is normal. No respiratory distress.     Breath sounds: Normal breath sounds and air entry. No stridor. No wheezing, rhonchi or rales.  Chest:     Chest wall: No tenderness.     Comments: Breasts: breasts appear normal, no suspicious masses, no skin or nipple changes or axillary nodes, symmetric fibrous changes in both upper outer quadrants, right breast normal without mass, skin or nipple  changes or axillary nodes, left breast normal without mass, skin or nipple changes or axillary nodes, risk and benefit of breast self-exam was discussed  Abdominal:     General: Abdomen is flat. Bowel sounds are normal. There is no distension.     Palpations: Abdomen is soft. There is no mass.     Tenderness: There is no abdominal tenderness. There is no right CVA tenderness, left CVA tenderness, guarding or rebound.     Hernia: No hernia is present.  Genitourinary:    General: Normal vulva.     Vagina: Normal.     Cervix: Normal.     Comments: Internal bimanual exam deferred Musculoskeletal:        General: No swelling, tenderness, deformity or signs of injury. Normal range of motion.     Cervical back: Full passive range of motion without pain, normal range of motion and neck supple. No edema, rigidity or tenderness. No muscular tenderness.     Right lower leg: No edema.     Left lower leg: No edema.  Lymphadenopathy:     Cervical: No cervical adenopathy.     Right cervical: No superficial, deep or posterior cervical adenopathy.    Left cervical: No superficial, deep or posterior cervical adenopathy.  Skin:    General: Skin is warm and dry.     Capillary Refill: Capillary refill takes less than 2 seconds.     Coloration: Skin is not jaundiced or pale.     Findings: No bruising, erythema, lesion or rash.  Neurological:     General: No focal deficit present.     Mental Status: She is alert and oriented to person, place, and time. Mental status is at baseline.     GCS: GCS eye subscore is 4. GCS verbal subscore is 5. GCS motor subscore is 6.     Sensory: Sensation is intact. No sensory deficit.     Motor: Motor function is intact. No weakness.     Coordination: Coordination is intact. Coordination normal.     Gait: Gait is intact. Gait normal.  Psychiatric:        Attention and Perception: Attention and perception normal.        Mood and Affect: Mood and affect normal.         Speech: Speech normal.        Behavior: Behavior normal. Behavior is cooperative.        Thought Content: Thought content normal.        Cognition and Memory: Cognition and memory normal.        Judgment: Judgment normal.      Last depression screening scores    08/17/2022    3:58 PM 06/28/2022    2:40 PM 05/05/2022    3:40 PM  PHQ 2/9 Scores  PHQ - 2 Score 1 0 1  PHQ- 9 Score 7  9 7   Last fall risk screening    08/17/2022    3:58 PM  Fall Risk   Falls in the past year? 0  Number falls in past yr: 0  Injury with Fall? 0  Risk for fall due to : No Fall Risks  Follow up Falls evaluation completed   Last Audit-C alcohol use screening    08/17/2022    3:58 PM  Alcohol Use Disorder Test (AUDIT)  1. How often do you have a drink containing alcohol? 1  2. How many drinks containing alcohol do you have on a typical day when you are drinking? 0  3. How often do you have six or more drinks on one occasion? 0  AUDIT-C Score 1   A score of 3 or more in women, and 4 or more in men indicates increased risk for alcohol abuse, EXCEPT if all of the points are from question 1   No results found for any visits on 10/30/22.  Assessment & Plan    Routine Health Maintenance and Physical Exam  Exercise Activities and Dietary recommendations  Goals      Manage My Emotions     Timeframe:  Long-Range Goal Priority:  Medium Start Date:     08/04/21                        Expected End Date:    09/22/21                Follow Up Date 09/22/21   - begin personal counseling - start or continue a personal journal - talk about feelings with a friend, family or spiritual advisor - practice positive thinking and self-talk    Why is this important?   When you are stressed, down or upset, your body reacts too.  For example, your blood pressure may get higher; you may have a headache or stomachache.  When your emotions get the best of you, your body's ability to fight off cold and flu gets weak.   These steps will help you manage your emotions.     Notes:         Immunization History  Administered Date(s) Administered   PFIZER(Purple Top)SARS-COV-2 Vaccination 05/25/2019, 06/17/2019, 03/22/2020, 07/17/2020   Td 09/20/2005, 06/01/2010   Tdap 09/20/2005, 06/01/2010    Health Maintenance  Topic Date Due   Zoster Vaccines- Shingrix (1 of 2) Never done   DTaP/Tdap/Td (5 - Td or Tdap) 05/31/2020   PAP SMEAR-Modifier  09/22/2021   COVID-19 Vaccine (5 - 2023-24 season) 11/25/2021   INFLUENZA VACCINE  06/25/2023 (Originally 10/26/2022)   MAMMOGRAM  05/11/2023   Colonoscopy  09/29/2028   Hepatitis C Screening  Completed   HIV Screening  Completed   HPV VACCINES  Aged Out    Discussed health benefits of physical activity, and encouraged her to engage in regular exercise appropriate for her age and condition.  Problem List Items Addressed This Visit       Cardiovascular and Mediastinum   Essential hypertension    Chronic, stable At goal Continue norvasc 10, hyzaar 100-25        Other   Annual physical exam - Primary    Things to do to keep yourself healthy  - Exercise at least 30-45 minutes a day, 3-4 days a week.  - Eat a low-fat diet with lots of fruits and vegetables, up to 7-9 servings per day.  - Seatbelts can save your life. Wear  them always.  - Smoke detectors on every level of your home, check batteries every year.  - Eye Doctor - have an eye exam every 1-2 years  - Safe sex - if you may be exposed to STDs, use a condom.  - Alcohol -  If you drink, do it moderately, less than 2 drinks per day.  - Health Care Power of Attorney. Choose someone to speak for you if you are not able.  - Depression is common in our stressful world.If you're feeling down or losing interest in things you normally enjoy, please come in for a visit.  - Violence - If anyone is threatening or hurting you, please call immediately.       Relevant Orders   CBC with Differential/Platelet    Comprehensive Metabolic Panel (CMET)   Lipid panel   Hemoglobin A1c   TSH + free T4   Depression, recurrent (HCC)    Chronic stable; continue prozac 10 and wellbutrin 150 Denies adjustments       Encounter for Papanicolaou smear for cervical cancer screening    Pap completed; no complaints      Relevant Orders   Cytology - PAP   Hypercholesterolemia    Chronic, stable Repeat LP Previously on crestor 5 mg      Morbid obesity (HCC)    Chronic, stable Body mass index is 39.65 kg/m. Discussed importance of healthy weight management Discussed diet and exercise       Pre-diabetes    Chronic, stable Repeat A1c Remains on 750 mg BID metformin      Return in about 6 months (around 05/02/2023) for chonic disease management.    Leilani Merl, FNP, have reviewed all documentation for this visit. The documentation on 10/30/22 for the exam, diagnosis, procedures, and orders are all accurate and complete.  Jacky Kindle, FNP  [facility] [phone] (phone) [phone] (fax)  [facility]

## 2022-10-30 NOTE — Assessment & Plan Note (Signed)
Chronic stable; continue prozac 10 and wellbutrin 150 Denies adjustments

## 2022-10-30 NOTE — Assessment & Plan Note (Signed)
Chronic, stable At goal Continue norvasc 10, hyzaar 100-25

## 2022-10-30 NOTE — Assessment & Plan Note (Signed)
Chronic, stable Repeat A1c Remains on 750 mg BID metformin

## 2022-11-01 ENCOUNTER — Other Ambulatory Visit: Payer: Self-pay | Admitting: Family Medicine

## 2022-11-01 DIAGNOSIS — F4329 Adjustment disorder with other symptoms: Secondary | ICD-10-CM

## 2022-11-01 DIAGNOSIS — F339 Major depressive disorder, recurrent, unspecified: Secondary | ICD-10-CM

## 2022-11-02 NOTE — Telephone Encounter (Signed)
Requested Prescriptions  Pending Prescriptions Disp Refills   buPROPion (WELLBUTRIN XL) 150 MG 24 hr tablet [Pharmacy Med Name: BUPROPION XL 150MG  TABLETS (24 H)] 90 tablet 1    Sig: TAKE 1 TABLET(150 MG) BY MOUTH DAILY     Psychiatry: Antidepressants - bupropion Passed - 11/01/2022  5:45 PM      Passed - Cr in normal range and within 360 days    Creat  Date Value Ref Range Status  10/11/2015 0.94 0.50 - 1.10 mg/dL Final   Creatinine, Ser  Date Value Ref Range Status  08/08/2022 0.97 0.57 - 1.00 mg/dL Final         Passed - AST in normal range and within 360 days    AST  Date Value Ref Range Status  08/08/2022 12 0 - 40 IU/L Final         Passed - ALT in normal range and within 360 days    ALT  Date Value Ref Range Status  08/08/2022 17 0 - 32 IU/L Final         Passed - Completed PHQ-2 or PHQ-9 in the last 360 days      Passed - Last BP in normal range    BP Readings from Last 1 Encounters:  10/30/22 (!) 123/59         Passed - Valid encounter within last 6 months    Recent Outpatient Visits           3 days ago Annual physical exam   [facility] Jacky Kindle, FNP   2 months ago Essential hypertension   [facility] Jacky Kindle, FNP   2 months ago Pre-diabetes   [facility] Merita Norton T, FNP   4 months ago Fatigue, unspecified type   [facility] Jacky Kindle, FNP   6 months ago Screening mammogram for breast cancer   [facility] Jacky Kindle, FNP       Future Appointments             In 2 weeks Jacky Kindle, FNP [facility]

## 2022-11-03 ENCOUNTER — Encounter: Payer: BC Managed Care – PPO | Admitting: Family Medicine

## 2022-11-15 ENCOUNTER — Telehealth: Payer: Self-pay

## 2022-11-15 NOTE — Telephone Encounter (Signed)
Copied from CRM [id number]. Topic: General - Inquiry >> Nov 14, 2022 12:00 PM Cassidy Kelly wrote: Reason for CRM: pt called saying she finished her 21days on her new cpap machine.  She was told to call when it was done.  CB@  [phone]

## 2022-11-16 ENCOUNTER — Ambulatory Visit: Payer: BC Managed Care – PPO | Admitting: Family Medicine

## 2022-11-17 ENCOUNTER — Encounter: Payer: Self-pay | Admitting: Family Medicine

## 2022-11-17 ENCOUNTER — Ambulatory Visit: Payer: BC Managed Care – PPO | Admitting: Family Medicine

## 2022-11-17 VITALS — BP 101/58 | HR 54 | Temp 98.1°F | Resp 16 | Ht 62.0 in | Wt 213.4 lb

## 2022-11-17 DIAGNOSIS — G4733 Obstructive sleep apnea (adult) (pediatric): Secondary | ICD-10-CM

## 2022-11-17 NOTE — Assessment & Plan Note (Addendum)
CPAP compliance  100% >70% for 21/30 days in last month AHI <4 events/hour Patient tolerating well Sleeping ~7 hours per night Nasal piece; may change to smaller size iso irritation  Pt reporting incresae in energy level, easier time to wake up and improved concentaion. She has been advocating for others to ensure compliance and notes she has spoken with her brother too, who noted that the 'CPAP will change her life'

## 2022-11-17 NOTE — Progress Notes (Signed)
Established patient visit   Patient: Cassidy Kelly   DOB: 1970-09-22   52 y.o. Female  MRN: [id number] Visit Date: 11/17/2022  Today's healthcare provider: Jacky Kindle, FNP  Introduced to nurse practitioner role and practice setting.  All questions answered.  Discussed provider/patient relationship and expectations.  Subjective    HPI HPI     Apnea    Additional comments: Patient reports good compliance and tolerance to CPAP machine every night.       Last edited by Myles Lipps, CMA on 11/17/2022  3:44 PM.      Medications: Outpatient Medications Prior to Visit  Medication Sig   amLODipine (NORVASC) 10 MG tablet TAKE 1 TABLET(10 MG) BY MOUTH DAILY   buPROPion (WELLBUTRIN XL) 150 MG 24 hr tablet TAKE 1 TABLET(150 MG) BY MOUTH DAILY   busPIRone (BUSPAR) 7.5 MG tablet Take 7.5 mg by mouth 2 (two) times daily.   cholecalciferol (VITAMIN D3) 25 MCG (1000 UNIT) tablet Take 1,000 Units by mouth daily.   FLUoxetine (PROZAC) 10 MG capsule Take 1 capsule (10 mg total) by mouth daily.   levocetirizine (XYZAL) 5 MG tablet Take 5 mg by mouth every evening.   losartan-hydrochlorothiazide (HYZAAR) 100-25 MG tablet TAKE 1 TABLET BY MOUTH DAILY   metFORMIN (GLUCOPHAGE-XR) 750 MG 24 hr tablet Take 1 tablet (750 mg total) by mouth 2 (two) times daily before a meal.   Multiple Vitamin (MULTIVITAMIN) tablet Take 1 tablet by mouth daily.   rosuvastatin (CRESTOR) 5 MG tablet Take 1 tablet (5 mg total) by mouth daily.   scopolamine (TRANSDERM-SCOP) 1 MG/3DAYS Place 1 patch (1.5 mg total) onto the skin every 3 (three) days.   TURMERIC PO Take by mouth.   No facility-administered medications prior to visit.    Review of Systems    Objective    BP (!) 101/58 (BP Location: Left Arm, Patient Position: Sitting, Cuff Size: Large)   Pulse (!) 54   Temp 98.1 F (36.7 C) (Temporal)   Resp 16   Ht 5\' 2"  (1.575 m)   Wt 213 lb 6.4 oz (96.8 kg)   LMP 08/01/2017   SpO2 98%   BMI  39.03 kg/m   Physical Exam Vitals and nursing note reviewed.  Constitutional:      General: She is not in acute distress.    Appearance: Normal appearance. She is obese. She is not ill-appearing, toxic-appearing or diaphoretic.  Cardiovascular:     Rate and Rhythm: Normal rate.     Pulses: Normal pulses.  Pulmonary:     Effort: Pulmonary effort is normal.  Musculoskeletal:        General: Normal range of motion.  Skin:    General: Skin is warm and dry.     Capillary Refill: Capillary refill takes less than 2 seconds.  Neurological:     General: No focal deficit present.     Mental Status: She is alert and oriented to person, place, and time. Mental status is at baseline.     Cranial Nerves: No cranial nerve deficit.     Sensory: No sensory deficit.     Motor: No weakness.     Coordination: Coordination normal.  Psychiatric:        Mood and Affect: Mood normal.        Behavior: Behavior normal.        Thought Content: Thought content normal.        Judgment: Judgment normal.  No results found for any visits on 11/17/22.  Assessment & Plan     Problem List Items Addressed This Visit       Respiratory   OSA on CPAP - Primary    CPAP compliance  100% >70% for 21/30 days in last month AHI <4 events/hour Patient tolerating well Sleeping ~7 hours per night Nasal piece; may change to smaller size iso irritation  Pt reporting incresae in energy level, easier time to wake up and improved concentaion. She has been advocating for others to ensure compliance and notes she has spoken with her brother too, who noted that the 'CPAP will change her life'      No follow-ups on file.     Leilani Merl, FNP, have reviewed all documentation for this visit. The documentation on 11/17/22 for the exam, diagnosis, procedures, and orders are all accurate and complete.  Jacky Kindle, FNP  [facility] [phone] (phone) [phone] (fax)  [facility]

## 2022-12-24 ENCOUNTER — Other Ambulatory Visit: Payer: Self-pay | Admitting: Family Medicine

## 2022-12-24 DIAGNOSIS — I10 Essential (primary) hypertension: Secondary | ICD-10-CM

## 2023-01-01 ENCOUNTER — Telehealth: Payer: Self-pay

## 2023-01-01 NOTE — Telephone Encounter (Signed)
Copied from CRM [id number]. Topic: General - Inquiry >> Jan 01, 2023  8:42 AM Cassidy Kelly wrote: Reason for CRM: Pt says she thinks her cholesterol medication is making her hands hurt so bad. [phone]

## 2023-01-01 NOTE — Telephone Encounter (Signed)
Yes. Recommend eval with PCP or other provider.

## 2023-01-02 NOTE — Telephone Encounter (Signed)
LM to call the office and schedule appointment regarding hand hands hurting.

## 2023-01-10 ENCOUNTER — Ambulatory Visit: Payer: BC Managed Care – PPO | Admitting: Family Medicine

## 2023-01-10 ENCOUNTER — Encounter: Payer: Self-pay | Admitting: Family Medicine

## 2023-01-10 VITALS — BP 101/58 | HR 62 | Ht 62.0 in | Wt 213.8 lb

## 2023-01-10 DIAGNOSIS — I10 Essential (primary) hypertension: Secondary | ICD-10-CM

## 2023-01-10 DIAGNOSIS — G720 Drug-induced myopathy: Secondary | ICD-10-CM | POA: Diagnosis not present

## 2023-01-10 NOTE — Progress Notes (Addendum)
Established patient visit   Patient: Cassidy Kelly   DOB: 03-26-1971   52 y.o. Female  MRN: [id number] Visit Date: 01/10/2023  Today's healthcare provider: Jacky Kindle, FNP  Introduced to nurse practitioner role and practice setting.  All questions answered.  Discussed provider/patient relationship and expectations.  Subjective    Hand Pain    HPI     Hand Pain    Additional comments: Present in both hands to the pint she could not fasten her bra, turn the door knob. Patient reports she believes her cholesterol medication is what is causing her hand pain. Patient reports she stopped taking the medication on 01/02/23. Patient reports she has been feeling relief for about 2 days now and has also noticed she is not having some other aches.       Last edited by Acey Lav, CMA on 01/10/2023  3:36 PM.     The patient, with a history of hyperlipidemia and anemia, presents with complaints of arm pain and other aches. They initially attributed these symptoms to physical activity, specifically kickball, but have since associated them with the cholesterol medication, rosuvastatin, started in May. The patient describes the pain as severe, affecting their ability to perform daily activities such as opening jars, turning keys, and high-fiving students. They also report difficulty putting on their bra due to the pain. The patient stopped the rosuvastatin about a week ago and has noticed an improvement in their symptoms. They also mention experiencing high blood pressure readings during the current visit, which they attribute to anxiety from the blood pressure cuff.  Medications: Outpatient Medications Prior to Visit  Medication Sig   amLODipine (NORVASC) 10 MG tablet TAKE 1 TABLET(10 MG) BY MOUTH DAILY   buPROPion (WELLBUTRIN XL) 150 MG 24 hr tablet TAKE 1 TABLET(150 MG) BY MOUTH DAILY   busPIRone (BUSPAR) 7.5 MG tablet Take 7.5 mg by mouth 2 (two) times daily.   cholecalciferol  (VITAMIN D3) 25 MCG (1000 UNIT) tablet Take 1,000 Units by mouth daily.   FLUoxetine (PROZAC) 10 MG capsule Take 1 capsule (10 mg total) by mouth daily.   levocetirizine (XYZAL) 5 MG tablet Take 5 mg by mouth every evening.   losartan-hydrochlorothiazide (HYZAAR) 100-25 MG tablet TAKE 1 TABLET BY MOUTH DAILY   metFORMIN (GLUCOPHAGE-XR) 750 MG 24 hr tablet Take 1 tablet (750 mg total) by mouth 2 (two) times daily before a meal.   Multiple Vitamin (MULTIVITAMIN) tablet Take 1 tablet by mouth daily.   scopolamine (TRANSDERM-SCOP) 1 MG/3DAYS Place 1 patch (1.5 mg total) onto the skin every 3 (three) days.   TURMERIC PO Take by mouth.   rosuvastatin (CRESTOR) 5 MG tablet Take 1 tablet (5 mg total) by mouth daily. (Patient not taking: Reported on 01/10/2023)   No facility-administered medications prior to visit.     Objective    BP (!) 101/58 Comment: previous  Pulse 62   Ht 5\' 2"  (1.575 m)   Wt 213 lb 12.8 oz (97 kg)   LMP 08/01/2017   SpO2 100%   BMI 39.10 kg/m   Physical Exam Vitals and nursing note reviewed.  Constitutional:      General: She is not in acute distress.    Appearance: Normal appearance. She is obese. She is not ill-appearing, toxic-appearing or diaphoretic.  HENT:     Head: Normocephalic and atraumatic.  Cardiovascular:     Rate and Rhythm: Normal rate and regular rhythm.     Pulses: Normal pulses.  Heart sounds: Normal heart sounds. No murmur heard.    No friction rub. No gallop.  Pulmonary:     Effort: Pulmonary effort is normal. No respiratory distress.     Breath sounds: Normal breath sounds. No stridor. No wheezing, rhonchi or rales.  Chest:     Chest wall: No tenderness.  Musculoskeletal:        General: No swelling, tenderness, deformity or signs of injury. Normal range of motion.     Right lower leg: No edema.     Left lower leg: No edema.  Skin:    General: Skin is warm and dry.     Capillary Refill: Capillary refill takes less than 2 seconds.      Coloration: Skin is not jaundiced or pale.     Findings: No bruising, erythema, lesion or rash.  Neurological:     General: No focal deficit present.     Mental Status: She is alert and oriented to person, place, and time. Mental status is at baseline.     Cranial Nerves: No cranial nerve deficit.     Sensory: No sensory deficit.     Motor: No weakness.     Coordination: Coordination normal.  Psychiatric:        Mood and Affect: Mood normal.        Behavior: Behavior normal.        Thought Content: Thought content normal.        Judgment: Judgment normal.      No results found for any visits on 01/10/23.  Assessment & Plan    Statin-induced myalgia Patient reports significant muscle aches and pains after starting Rosuvastatin in May. Symptoms include arm pain, difficulty opening jars, and shoulder pain. Symptoms improved after discontinuing the medication on 01/02/2023. -Order labs to rule out other causes of muscle pain. -Discuss alternative cholesterol-lowering medications or lower-dose statin therapy.  Hypertension Elevated blood pressure noted during today's visit, though patient reports no symptoms. Previous readings have been normal. -Recheck blood pressure during today's visit. -Continue current management and monitor.  Anemia Mild anemia noted on previous labs. -Repeat labs to assess current status.  Hyperlipidemia Patient was started on Rosuvastatin in May but has since discontinued due to side effects. -Discuss alternative cholesterol-lowering medications or lower-dose statin therapy.  General Health Maintenance -Defer A1C check as it is a month early. -Continue current management and monitor.      Leilani Merl, FNP, have reviewed all documentation for this visit. The documentation on 01/10/23 for the exam, diagnosis, procedures, and orders are all accurate and complete.  Jacky Kindle, FNP  [facility] [phone]  (phone) [phone] (fax)  [facility]

## 2023-01-10 NOTE — Telephone Encounter (Signed)
Patient has office visit today.

## 2023-01-11 ENCOUNTER — Other Ambulatory Visit: Payer: Self-pay | Admitting: Family Medicine

## 2023-01-11 LAB — CBC
Hematocrit: 33.4 % — ABNORMAL LOW (ref 34.0–46.6)
Hemoglobin: 11 g/dL — ABNORMAL LOW (ref 11.1–15.9)
MCH: 29.9 pg (ref 26.6–33.0)
MCHC: 32.9 g/dL (ref 31.5–35.7)
MCV: 91 fL (ref 79–97)
Platelets: 218 10*3/uL (ref 150–450)
RBC: 3.68 x10E6/uL — ABNORMAL LOW (ref 3.77–5.28)
RDW: 12.5 % (ref 11.7–15.4)
WBC: 8.3 10*3/uL (ref 3.4–10.8)

## 2023-01-11 LAB — BASIC METABOLIC PANEL
BUN/Creatinine Ratio: 14 (ref 9–23)
BUN: 14 mg/dL (ref 6–24)
CO2: 24 mmol/L (ref 20–29)
Calcium: 9.1 mg/dL (ref 8.7–10.2)
Chloride: 102 mmol/L (ref 96–106)
Creatinine, Ser: 1.01 mg/dL — ABNORMAL HIGH (ref 0.57–1.00)
Glucose: 100 mg/dL — ABNORMAL HIGH (ref 70–99)
Potassium: 4 mmol/L (ref 3.5–5.2)
Sodium: 140 mmol/L (ref 134–144)
eGFR: 67 mL/min/{1.73_m2} (ref >59)

## 2023-01-11 LAB — CK TOTAL AND CKMB (NOT AT ARMC)
CK-MB Index: 2.5 ng/mL (ref 0.0–5.3)
Total CK: 263 U/L — ABNORMAL HIGH (ref 32–182)

## 2023-01-11 LAB — TSH: TSH: 2.29 u[IU]/mL (ref 0.450–4.500)

## 2023-01-11 MED ORDER — EZETIMIBE 10 MG PO TABS: 10.0000 mg | ORAL_TABLET | Freq: Every day | ORAL | 3 refills | Status: DC

## 2023-01-11 NOTE — Progress Notes (Signed)
Labs consistent with statin myopathy; recommend start of zetia as recommended to assist with cholesterol in addition to recommendation to continue a diet low in saturated fat and regular exercise - 30 min at least 5 times per week

## 2023-01-12 ENCOUNTER — Telehealth: Payer: Self-pay

## 2023-01-12 ENCOUNTER — Other Ambulatory Visit: Payer: Self-pay

## 2023-01-12 NOTE — Telephone Encounter (Signed)
-----   Message from Jacky Kindle sent at 01/11/2023  3:58 PM EDT ----- Labs consistent with statin myopathy; recommend start of zetia as recommended to assist with cholesterol in addition to recommendation to continue a diet low in saturated fat and regular exercise - 30 min at least 5 times per week

## 2023-03-28 ENCOUNTER — Other Ambulatory Visit: Payer: Self-pay | Admitting: Family Medicine

## 2023-03-28 DIAGNOSIS — I10 Essential (primary) hypertension: Secondary | ICD-10-CM

## 2023-05-04 ENCOUNTER — Telehealth: Payer: Self-pay

## 2023-05-04 NOTE — Telephone Encounter (Signed)
 Copied from CRM [id number]. Topic: General - Other >> May 04, 2023 11:14 AM Selinda RAMAN wrote: Reason for CRM: Verneita with [facility] [employer] called in stating the patient is aware to discuss cpap equipment at her upcoming visit. Verneita is also going to fax over corresponding paperwork pertaining to this. Please assist patient further.

## 2023-05-16 ENCOUNTER — Encounter: Payer: Self-pay | Admitting: Family Medicine

## 2023-05-16 ENCOUNTER — Telehealth (INDEPENDENT_AMBULATORY_CARE_PROVIDER_SITE_OTHER): Payer: 59 | Admitting: Family Medicine

## 2023-05-16 DIAGNOSIS — F339 Major depressive disorder, recurrent, unspecified: Secondary | ICD-10-CM

## 2023-05-16 DIAGNOSIS — E1122 Type 2 diabetes mellitus with diabetic chronic kidney disease: Secondary | ICD-10-CM

## 2023-05-16 DIAGNOSIS — N182 Chronic kidney disease, stage 2 (mild): Secondary | ICD-10-CM

## 2023-05-16 DIAGNOSIS — G4733 Obstructive sleep apnea (adult) (pediatric): Secondary | ICD-10-CM | POA: Diagnosis not present

## 2023-05-16 DIAGNOSIS — F4329 Adjustment disorder with other symptoms: Secondary | ICD-10-CM

## 2023-05-16 DIAGNOSIS — I10 Essential (primary) hypertension: Secondary | ICD-10-CM

## 2023-05-16 DIAGNOSIS — Z7984 Long term (current) use of oral hypoglycemic drugs: Secondary | ICD-10-CM

## 2023-05-16 DIAGNOSIS — R7303 Prediabetes: Secondary | ICD-10-CM

## 2023-05-16 DIAGNOSIS — E7800 Pure hypercholesterolemia, unspecified: Secondary | ICD-10-CM

## 2023-05-16 DIAGNOSIS — Z1231 Encounter for screening mammogram for malignant neoplasm of breast: Secondary | ICD-10-CM

## 2023-05-16 MED ORDER — BUPROPION HCL ER (XL) 150 MG PO TB24
150.0000 mg | ORAL_TABLET | Freq: Every day | ORAL | 1 refills | Status: DC
Start: 2023-05-16 — End: 2023-12-05

## 2023-05-16 MED ORDER — AMLODIPINE BESYLATE 10 MG PO TABS
10.0000 mg | ORAL_TABLET | Freq: Every day | ORAL | 0 refills | Status: DC
Start: 2023-05-16 — End: 2023-08-27

## 2023-05-16 MED ORDER — TIRZEPATIDE-WEIGHT MANAGEMENT 2.5 MG/0.5ML ~~LOC~~ SOLN
2.5000 mg | SUBCUTANEOUS | 1 refills | Status: DC
Start: 2023-05-16 — End: 2023-05-29

## 2023-05-16 MED ORDER — METFORMIN HCL ER 750 MG PO TB24
750.0000 mg | ORAL_TABLET | Freq: Two times a day (BID) | ORAL | 1 refills | Status: DC
Start: 2023-05-16 — End: 2023-12-05

## 2023-05-16 MED ORDER — FLUOXETINE HCL 10 MG PO CAPS: 10.0000 mg | ORAL_CAPSULE | Freq: Every day | ORAL | 3 refills | Status: AC

## 2023-05-16 NOTE — Assessment & Plan Note (Addendum)
 Severe obstructive sleep apnea with symptoms of loud snoring and observed apneas. Currently using a CPAP machine, likely on auto-titration. Requires new CPAP supplies. - Send order for CPAP supplies to [facility]  Struggling with weight loss, contributing to sleep apnea. Discussed Zepbound which is FDA-approved for sleep apnea. Explained risks: nausea, vomiting, constipation, diarrhea, potential vision issues (NAION). Discussed benefits of weight loss and improved sleep apnea.  - Given desire for tighter control of blood sugars, will order Zepbound-alternative, Mounjaro, as both are tirzepatide compound. - Monitor for side effects and efficacy - Increase dose every four weeks if well-tolerated

## 2023-05-16 NOTE — Assessment & Plan Note (Signed)
 Historically well-controlled on losartan-hydrochlorothiazide and amlodipine. - Continue losartan-hydrochlorothiazide  - Continue amlodipine - Refill amlodipine prescription

## 2023-05-16 NOTE — Patient Instructions (Addendum)
 Discussed link/non-link between GLP-1 (specifically semaglutide) and NAION (Non-arteritic ischemic optic neuropathy) - recent study by the [employer] suggests there is not a link between the two.

## 2023-05-16 NOTE — Assessment & Plan Note (Addendum)
 Stable. Currently on metformin 750 mg twice daily.  - Continue metformin 750 mg twice daily - Refill metformin prescription - Start Mounjaro per patient preference to facilitate tighter control of blood sugars, given low risk for hypoglycemia with Mounjaro, as well as reduced impact of sleep apnea on her overall health.  Though her A1c has not been documented as being above 6.5, the patient does qualify as having diabetes as she has taken metformin consistently while these A1cs have been checked.  This patient has been on metformin 750 mg twice daily since May 2024. Her most recent A1c was 6.2.  As indicated in the reference included below, Metformin can lower A1c by 1.0% to 1.5%.  Given that she is  of the maximum dose (1000mg  twice daily), her A1c is reduced a minimum of 0.75% of where it would be without metformin, which easily pushes her A1c up above 6.5.  Salvadore Farber PharmD, CPP and Vanice Virginia Curl PharmD, BCACP, CPP: Primary Care: Clinics in [facility], 2020-08-25, Volume 49, Issue 2, Pages 315-326, Copyright  2021 [employer].

## 2023-05-16 NOTE — Progress Notes (Addendum)
 MyChart Video Visit    Virtual Visit via Video Note   This format is felt to be most appropriate for this patient at this time. Physical exam was limited by quality of the video and audio technology used for the visit.   Patient location: In [place] Provider location: [facility]  I discussed the limitations of evaluation and management by telemedicine and the availability of in person appointments. The patient expressed understanding and agreed to proceed.  Patient: Cassidy Kelly   DOB: 1971-03-26   53 y.o. Female  MRN: [id number] Visit Date: 05/16/2023  Today's healthcare provider: Sherlyn Hay, DO   No chief complaint on file.  Subjective    HPI  Cassidy Kelly is a 53 year old female with sleep apnea and prediabetes who presents for CPAP supply management and weight loss consultation.  She requires new supplies for her CPAP machine due to insurance requirements. She has been using a CPAP machine since July of the previous year after being diagnosed with severe sleep apnea. Her initial symptoms included extremely loud snoring and episodes of apnea observed by her friend. She also experienced significant weight gain and persistent fatigue, feeling not rested and tired frequently.  She is struggling with weight loss and is exploring options to assist with this. She has previously discussed weight management with her former provider, who prescribed metformin due to her A1c levels. Her last recorded A1c was 6.1, indicating prediabetes. She is currently taking metformin 750 mg twice daily. She attributes some of her weight management challenges to menopause, which she describes as taking her for a loop.  She is currently taking several medications including metformin 750 mg twice daily, ezetimibe for cholesterol, losartan for blood pressure, bupropion 150 mg daily, and fluoxetine 10 mg daily.   She reports no longer experiencing hand aches after switching  cholesterol medications. No personal history of pancreatitis and no family history of medullary thyroid cancer or multiple endocrine neoplasia type 2. She is unaware of her paternal family medical history but notes that her maternal grandparents had diabetes.    Medications: Outpatient Medications Prior to Visit  Medication Sig   cholecalciferol (VITAMIN D3) 25 MCG (1000 UNIT) tablet Take 1,000 Units by mouth daily.   ezetimibe (ZETIA) 10 MG tablet Take 1 tablet (10 mg total) by mouth daily.   levocetirizine (XYZAL) 5 MG tablet Take 5 mg by mouth every evening.   losartan-hydrochlorothiazide (HYZAAR) 100-25 MG tablet TAKE 1 TABLET BY MOUTH DAILY   Multiple Vitamin (MULTIVITAMIN) tablet Take 1 tablet by mouth daily.   scopolamine (TRANSDERM-SCOP) 1 MG/3DAYS Place 1 patch (1.5 mg total) onto the skin every 3 (three) days.   TURMERIC PO Take by mouth.   [DISCONTINUED] amLODipine (NORVASC) 10 MG tablet TAKE 1 TABLET(10 MG) BY MOUTH DAILY   [DISCONTINUED] buPROPion (WELLBUTRIN XL) 150 MG 24 hr tablet TAKE 1 TABLET(150 MG) BY MOUTH DAILY   [DISCONTINUED] busPIRone (BUSPAR) 7.5 MG tablet Take 7.5 mg by mouth 2 (two) times daily.   [DISCONTINUED] FLUoxetine (PROZAC) 10 MG capsule Take 1 capsule (10 mg total) by mouth daily.   [DISCONTINUED] metFORMIN (GLUCOPHAGE-XR) 750 MG 24 hr tablet Take 1 tablet (750 mg total) by mouth 2 (two) times daily before a meal.   No facility-administered medications prior to visit.       Objective    LMP 08/01/2017      Physical Exam Constitutional:      General: She is not in acute distress.  Appearance: Normal appearance.  HENT:     Head: Normocephalic.  Pulmonary:     Effort: Pulmonary effort is normal. No respiratory distress.  Neurological:     Mental Status: She is alert and oriented to person, place, and time. Mental status is at baseline.       Assessment & Plan    Encounter for screening mammogram for breast cancer -     3D Screening  Mammogram, Left and Right; Future  Severe obstructive sleep apnea Assessment & Plan: Severe obstructive sleep apnea with symptoms of loud snoring and observed apneas. Currently using a CPAP machine, likely on auto-titration. Requires new CPAP supplies. - Send order for CPAP supplies to [facility]  Struggling with weight loss, contributing to sleep apnea. Discussed Zepbound which is FDA-approved for sleep apnea. Explained risks: nausea, vomiting, constipation, diarrhea, potential vision issues (NAION). Discussed benefits of weight loss and improved sleep apnea.  - Given desire for tighter control of blood sugars, will order Zepbound-alternative, Mounjaro, as both are tirzepatide compound. - Monitor for side effects and efficacy - Increase dose every four weeks if well-tolerated  Orders: -     For home use only DME continuous positive airway pressure (CPAP) -     Tirzepatide; Inject 2.5 mg into the skin once a week.  Dispense: 2 mL; Refill: 0  Depression, recurrent (HCC) Assessment & Plan: On bupropion and fluoxetine. Reports adherence to both medications. - Continue bupropion 150 mg daily - Continue fluoxetine 10 mg daily - Refill bupropion and fluoxetine prescriptions  Orders: -     buPROPion HCl ER (XL); Take 1 tablet (150 mg total) by mouth daily.  Dispense: 90 tablet; Refill: 1 -     FLUoxetine HCl; Take 1 capsule (10 mg total) by mouth daily.  Dispense: 90 capsule; Refill: 3  Stress and adjustment reaction -     buPROPion HCl ER (XL); Take 1 tablet (150 mg total) by mouth daily.  Dispense: 90 tablet; Refill: 1  Essential hypertension Assessment & Plan: Historically well-controlled on losartan-hydrochlorothiazide and amlodipine. - Continue losartan-hydrochlorothiazide  - Continue amlodipine - Refill amlodipine prescription  Orders: -     amLODIPine Besylate; Take 1 tablet (10 mg total) by mouth daily.  Dispense: 90 tablet; Refill: 0  Type 2 diabetes mellitus with  stage 2 chronic kidney disease, without long-term current use of insulin (HCC) Assessment & Plan: Stable. Currently on metformin 750 mg twice daily.  - Continue metformin 750 mg twice daily - Refill metformin prescription - Start Mounjaro per patient preference to facilitate tighter control of blood sugars, given low risk for hypoglycemia with Mounjaro, as well as reduced impact of sleep apnea on her overall health.  Though her A1c has not been documented as being above 6.5, the patient does qualify as having diabetes as she has taken metformin consistently while these A1cs have been checked.  This patient has been on metformin 750 mg twice daily since May 2024. Her most recent A1c was 6.2.  As indicated in the reference included below, Metformin can lower A1c by 1.0% to 1.5%.  Given that she is  of the maximum dose (1000mg  twice daily), her A1c is reduced a minimum of 0.75% of where it would be without metformin, which easily pushes her A1c up above 6.5.  Salvadore Farber PharmD, CPP and Vanice Breauna Mazzeo PharmD, BCACP, CPP: Primary Care: Clinics in [facility], 2020-08-25, Volume 49, Issue 2, Pages [phone], Copyright  2021 [employer]  Orders: -  metFORMIN HCl ER; Take 1 tablet (750 mg total) by mouth 2 (two) times daily before a meal.  Dispense: 180 tablet; Refill: 1 -     Tirzepatide; Inject 2.5 mg into the skin once a week.  Dispense: 2 mL; Refill: 0  Hypercholesterolemia Assessment & Plan: Switched to ezetimibe due to statin-induced muscle aches. - Continue ezetimibe - Monitor lipid levels   General Health Maintenance Due for vaccinations and screenings. Last tetanus vaccine in 2012. Behind on COVID boosters. Due for shingles vaccine. Mammogram order needed. - Get tetanus vaccine at pharmacy or clinic - Get COVID booster at pharmacy - Get shingles vaccine at pharmacy or clinic - Send order for mammogram   Return in about 3 months (around 08/13/2023).     I discussed the  assessment and treatment plan with the patient. The patient was provided an opportunity to ask questions and all were answered. The patient agreed with the plan and demonstrated an understanding of the instructions.   The patient was advised to call back or seek an in-person evaluation if the symptoms worsen or if the condition fails to improve as anticipated.  I provided 24 minutes of virtual-face-to-face time during this encounter.   Sherlyn Hay, DO [facility] [phone] (phone) [phone] (fax)  [facility]

## 2023-05-16 NOTE — Assessment & Plan Note (Signed)
 On bupropion and fluoxetine. Reports adherence to both medications. - Continue bupropion 150 mg daily - Continue fluoxetine 10 mg daily - Refill bupropion and fluoxetine prescriptions

## 2023-05-16 NOTE — Assessment & Plan Note (Signed)
 Switched to ezetimibe due to statin-induced muscle aches. - Continue ezetimibe - Monitor lipid levels

## 2023-05-17 ENCOUNTER — Encounter: Payer: Self-pay | Admitting: Family Medicine

## 2023-05-17 DIAGNOSIS — N182 Chronic kidney disease, stage 2 (mild): Secondary | ICD-10-CM

## 2023-05-17 DIAGNOSIS — G4733 Obstructive sleep apnea (adult) (pediatric): Secondary | ICD-10-CM

## 2023-05-29 MED ORDER — TIRZEPATIDE 2.5 MG/0.5ML ~~LOC~~ SOAJ
2.5000 mg | SUBCUTANEOUS | 0 refills | Status: DC
Start: 2023-05-29 — End: 2023-06-01

## 2023-05-30 ENCOUNTER — Telehealth: Payer: Self-pay | Admitting: Family Medicine

## 2023-05-30 NOTE — Telephone Encounter (Signed)
 [facility] pharmacy is requesting refill tirzepatide [facility]) 2.5 MG/0.5ML Pen  Please advise

## 2023-05-31 ENCOUNTER — Other Ambulatory Visit: Payer: Self-pay

## 2023-05-31 ENCOUNTER — Telehealth: Payer: Self-pay

## 2023-05-31 DIAGNOSIS — N182 Chronic kidney disease, stage 2 (mild): Secondary | ICD-10-CM

## 2023-05-31 DIAGNOSIS — G4733 Obstructive sleep apnea (adult) (pediatric): Secondary | ICD-10-CM

## 2023-05-31 NOTE — Telephone Encounter (Signed)
 Greggory Keen will not be covered for DX code of prediabetes. Patient must have a diagnosis of diabetes with an A1C of 6.5 or greater. Please consider changing therapies.

## 2023-05-31 NOTE — Telephone Encounter (Signed)
 Copied from CRM [id number]. Topic: General - Other >> May 31, 2023 11:37 AM Emylou G wrote: Reason for CRM: Please Mandy w/ [employer] called.. checking status of mounjaro preauth so they can fill the medication. [phone] preauth line

## 2023-06-01 LAB — HM MAMMOGRAPHY

## 2023-06-01 MED ORDER — TIRZEPATIDE 2.5 MG/0.5ML ~~LOC~~ SOAJ: 2.5000 mg | SUBCUTANEOUS | 0 refills | Status: DC

## 2023-06-01 NOTE — Telephone Encounter (Signed)
 Copied from CRM [id number]. Topic: Clinical - Prescription Issue >> Jun 01, 2023 12:17 PM Geroge Baseman wrote: Reason for CRM: patient is waiting on an prescription to be filled for tirzepatide [facility]) 2.5 MG/0.5ML Pen, needed a prior auth. Just checking in to make sure this is in process for her. Wanting to know of any updates, please advise asap.

## 2023-06-01 NOTE — Addendum Note (Signed)
 Addended by: Jacquenette Shone on: 06/01/2023 04:14 PM   Modules accepted: Orders

## 2023-06-04 ENCOUNTER — Telehealth: Payer: Self-pay

## 2023-06-04 ENCOUNTER — Other Ambulatory Visit (HOSPITAL_COMMUNITY): Payer: Self-pay

## 2023-06-04 NOTE — Telephone Encounter (Signed)
 Advised

## 2023-06-04 NOTE — Telephone Encounter (Signed)
 Pharmacy Patient Advocate Encounter  Received notification from [facility] that Prior Authorization for [facility] 2.5MG /0.5ML auto-injectors  has been APPROVED from 06/03/23 to 06/03/26. Unable to obtain price due to refill too soon rejection, last fill date 06/04/23 next available fill date04/01/25   PA #/Case ID/Reference #: [id number]

## 2023-06-04 NOTE — Telephone Encounter (Signed)
 Pharmacy Patient Advocate Encounter   Received notification from Pt Calls Messages that prior authorization for Mounjaro 2.5MG /0.5ML auto-injectors is required/requested.   Insurance verification completed.   The patient is insured through [facility] .   Per test claim: PA required; PA submitted to above mentioned insurance via CoverMyMeds Key/confirmation #/EOC [id number] Status is pending

## 2023-06-05 ENCOUNTER — Encounter: Payer: Self-pay | Admitting: Family Medicine

## 2023-07-02 LAB — HM DIABETES EYE EXAM

## 2023-07-24 ENCOUNTER — Encounter: Payer: Self-pay | Admitting: Family Medicine

## 2023-07-27 ENCOUNTER — Encounter: Payer: Self-pay | Admitting: Family Medicine

## 2023-07-27 ENCOUNTER — Ambulatory Visit (INDEPENDENT_AMBULATORY_CARE_PROVIDER_SITE_OTHER): Admitting: Family Medicine

## 2023-07-27 VITALS — BP 109/70 | HR 64 | Resp 17 | Ht 62.0 in | Wt 202.3 lb

## 2023-07-27 DIAGNOSIS — I10 Essential (primary) hypertension: Secondary | ICD-10-CM | POA: Diagnosis not present

## 2023-07-27 DIAGNOSIS — E1159 Type 2 diabetes mellitus with other circulatory complications: Secondary | ICD-10-CM | POA: Diagnosis not present

## 2023-07-27 DIAGNOSIS — Z7985 Long-term (current) use of injectable non-insulin antidiabetic drugs: Secondary | ICD-10-CM

## 2023-07-27 DIAGNOSIS — Z7984 Long term (current) use of oral hypoglycemic drugs: Secondary | ICD-10-CM

## 2023-07-27 DIAGNOSIS — E1122 Type 2 diabetes mellitus with diabetic chronic kidney disease: Secondary | ICD-10-CM | POA: Diagnosis not present

## 2023-07-27 DIAGNOSIS — N182 Chronic kidney disease, stage 2 (mild): Secondary | ICD-10-CM

## 2023-07-27 LAB — POCT GLYCOSYLATED HEMOGLOBIN (HGB A1C)
Est. average glucose Bld gHb Est-mCnc: 111
Hemoglobin A1C: 5.5 % (ref 4.0–5.6)

## 2023-07-27 MED ORDER — TIRZEPATIDE 5 MG/0.5ML ~~LOC~~ SOAJ: 5.0000 mg | SUBCUTANEOUS | 3 refills | Status: DC

## 2023-07-27 NOTE — Patient Instructions (Signed)
 Marland Kitchen  Please review the attached list of medications and notify my office if there are any errors.   . Please bring all of your medications to every appointment so we can make sure that our medication list is the same as yours.

## 2023-07-27 NOTE — Progress Notes (Unsigned)
 Established patient visit   Patient: Cassidy Kelly   DOB: [birth year]   53 y.o. Female  MRN: [id number] Visit Date: 07/27/2023  Today's healthcare provider: Jeralene Mom, MD   Chief Complaint  Patient presents with   Diabetes    Started taking Monjaro 2 months ago and is still taking metformin , just wants to make sure they are working  Pt drink 1 soda a day, less carbs and starches   Subjective    HPI  Follow up since starting Mounjaro 2.5mg  8 weeks ago. Is tolerating well with no apparent side effects. Specifically denies nausea, cramping, and constipation, and reports having normal BM most days. Does not check sugars consistently.   Lab Results  Component Value Date   HGBA1C 5.5 07/27/2023   HGBA1C 6.1 (H) 11/07/2022   HGBA1C 6.2 (H) 08/08/2022   BP Readings from Last 3 Encounters:  07/27/23 109/70  01/10/23 (!) 101/58  11/17/22 (!) 101/58   Wt Readings from Last 5 Encounters:  07/27/23 202 lb 4.8 oz (91.8 kg)  01/10/23 213 lb 12.8 oz (97 kg)  11/17/22 213 lb 6.4 oz (96.8 kg)  10/30/22 216 lb 12.8 oz (98.3 kg)  08/17/22 213 lb 6.4 oz (96.8 kg)     Medications: Outpatient Medications Prior to Visit  Medication Sig   amLODipine  (NORVASC ) 10 MG tablet Take 1 tablet (10 mg total) by mouth daily.   buPROPion  (WELLBUTRIN  XL) 150 MG 24 hr tablet Take 1 tablet (150 mg total) by mouth daily.   cholecalciferol (VITAMIN D3) 25 MCG (1000 UNIT) tablet Take 1,000 Units by mouth daily.   ezetimibe  (ZETIA ) 10 MG tablet Take 1 tablet (10 mg total) by mouth daily.   FLUoxetine  (PROZAC ) 10 MG capsule Take 1 capsule (10 mg total) by mouth daily.   levocetirizine (XYZAL) 5 MG tablet Take 5 mg by mouth every evening.   losartan -hydrochlorothiazide  (HYZAAR) 100-25 MG tablet TAKE 1 TABLET BY MOUTH DAILY   metFORMIN  (GLUCOPHAGE -XR) 750 MG 24 hr tablet Take 1 tablet (750 mg total) by mouth 2 (two) times daily before a meal.   Multiple Vitamin (MULTIVITAMIN) tablet Take 1  tablet by mouth daily.   scopolamine  (TRANSDERM-SCOP) 1 MG/3DAYS Place 1 patch (1.5 mg total) onto the skin every 3 (three) days.    tirzepatide (MOUNJARO) 2.5 MG/0.5ML Pen Inject 2.5 mg into the skin once a week.   TURMERIC PO Take by mouth. (Patient not taking: Reported on 07/27/2023)   No facility-administered medications prior to visit.    {Insert previous labs (optional):23779} {See past labs  Heme  Chem  Endocrine  Serology  Results Review (optional):1}   Objective    BP 109/70 (BP Location: Left Arm, Patient Position: Sitting, Cuff Size: Large)   Pulse 64   Resp 17   Ht 5\' 2"  (1.575 m)   Wt 202 lb 4.8 oz (91.8 kg)   LMP 08/01/2017   SpO2 98%   BMI 37.00 kg/m  {Insert last BP/Wt (optional):23777}{See vitals history (optional):1}  Physical Exam   General appearance: Obese female, cooperative and in no acute distress Head: Normocephalic, without obvious abnormality, atraumatic Respiratory: Respirations even and unlabored, normal respiratory rate Extremities: All extremities are intact.  Skin: Skin color, texture, turgor normal. No rashes seen  Psych: Appropriate mood and affect. Neurologic: Mental status: Alert, oriented to person, place, and time, thought content appropriate.   Results for orders placed or performed in visit on 07/27/23  POCT glycosylated hemoglobin (Hb A1C)  Result Value  Ref Range   Hemoglobin A1C 5.5 4.0 - 5.6 %   Est. average glucose Bld gHb Est-mCnc 111     Assessment & Plan     1. Type 2 diabetes mellitus with stage 2 chronic kidney disease, without long-term current use of insulin (HCC) (Primary) Tolerating starting dose of Mounjaro well with no apparent side effects. Titrate up to maintenance dose. Consider reducing metformin  if stays on maintenance dose of Mounjaro  2. Essential hypertension Very well controlled. She does report getting a little light head if she jumps up quickly. Discussed reducing dose BP medication if she continues to  lose weight as BP may drop more with weight loss. She prefers to continue current regiment for now.    Return in about 16 weeks (around 11/16/2023) for Yearly Physical.         Jeralene Mom, MD  [facility] [phone] (phone) [phone] (fax)  [facility]

## 2023-08-14 ENCOUNTER — Other Ambulatory Visit: Payer: Self-pay

## 2023-08-14 NOTE — Progress Notes (Signed)
 UDS CLEARED FOR PRE-EMPLOYMENT

## 2023-08-21 ENCOUNTER — Telehealth: Payer: Self-pay | Admitting: Family Medicine

## 2023-08-21 DIAGNOSIS — I10 Essential (primary) hypertension: Secondary | ICD-10-CM

## 2023-08-21 MED ORDER — LOSARTAN POTASSIUM-HCTZ 100-25 MG PO TABS: 1.0000 | ORAL_TABLET | Freq: Every day | ORAL | 1 refills | Status: DC

## 2023-08-21 NOTE — Telephone Encounter (Signed)
 Rx has been approved and sent to the pharmacy on file

## 2023-08-21 NOTE — Telephone Encounter (Signed)
 [facility] pharmacy is requesting refill losartan-hydrochlorothiazide (HYZAAR) 100-25 MG tablet  Please advise

## 2023-08-27 ENCOUNTER — Telehealth: Payer: Self-pay | Admitting: Family Medicine

## 2023-08-27 DIAGNOSIS — I10 Essential (primary) hypertension: Secondary | ICD-10-CM

## 2023-08-27 MED ORDER — AMLODIPINE BESYLATE 10 MG PO TABS: 10.0000 mg | ORAL_TABLET | Freq: Every day | ORAL | 0 refills | Status: DC

## 2023-08-27 NOTE — Telephone Encounter (Signed)
[facility] pharmacy faxed refill request for the following medications:   amLODipine (NORVASC) 10 MG tablet     Please advise  

## 2023-10-15 ENCOUNTER — Telehealth: Payer: Self-pay

## 2023-10-15 DIAGNOSIS — E1122 Type 2 diabetes mellitus with diabetic chronic kidney disease: Secondary | ICD-10-CM

## 2023-10-15 NOTE — Telephone Encounter (Signed)
 Copied from CRM [id number]. Topic: Clinical - Medication Question >> Oct 15, 2023 11:47 AM Cassidy Kelly wrote: Reason for CRM: patient is calling about dosage for tirzepatide  (MOUNJARO ) 5 MG/0.5ML Pen she wants to know if it is time to increase the dosage. She would also like to know if she is suppose to stay on metFORMIN  (GLUCOPHAGE -XR) 750 MG 24 hr tablet or if she would discontinue.

## 2023-10-15 NOTE — Telephone Encounter (Signed)
 Please review, pt saw Dr. Gasper 07/27/23 his note Consider reducing metformin  if stays on maintenance dose of Mounjaro . Can you advise.

## 2023-10-17 MED ORDER — TIRZEPATIDE 7.5 MG/0.5ML ~~LOC~~ SOAJ
7.5000 mg | SUBCUTANEOUS | 3 refills | Status: DC
Start: 2023-10-17 — End: 2023-12-05

## 2023-10-17 NOTE — Telephone Encounter (Signed)
 Left detailed vm per DPR.  Ok to advise per Dr. Donzella. Please verify pharmacy if patient is ready to increase her dosage so that rx can be sent

## 2023-10-17 NOTE — Telephone Encounter (Signed)
 Copied from CRM [id number]. Topic: Clinical - Medication Question >> Oct 17, 2023 11:28 AM Emylou G wrote: Patient called.. said she has had no side effects and okay to increase from the 7.5

## 2023-10-17 NOTE — Addendum Note (Signed)
 Addended by: DONZELLA DOMINO on: 10/17/2023 05:31 PM   Modules accepted: Orders

## 2023-11-02 ENCOUNTER — Encounter: Admitting: Family Medicine

## 2023-12-05 ENCOUNTER — Ambulatory Visit (INDEPENDENT_AMBULATORY_CARE_PROVIDER_SITE_OTHER): Admitting: Family Medicine

## 2023-12-05 ENCOUNTER — Encounter: Payer: Self-pay | Admitting: Family Medicine

## 2023-12-05 VITALS — BP 120/85 | HR 60 | Ht 62.0 in | Wt 192.0 lb

## 2023-12-05 DIAGNOSIS — E7800 Pure hypercholesterolemia, unspecified: Secondary | ICD-10-CM | POA: Diagnosis not present

## 2023-12-05 DIAGNOSIS — E1122 Type 2 diabetes mellitus with diabetic chronic kidney disease: Secondary | ICD-10-CM | POA: Diagnosis not present

## 2023-12-05 DIAGNOSIS — Z0000 Encounter for general adult medical examination without abnormal findings: Secondary | ICD-10-CM

## 2023-12-05 DIAGNOSIS — I10 Essential (primary) hypertension: Secondary | ICD-10-CM | POA: Diagnosis not present

## 2023-12-05 DIAGNOSIS — F339 Major depressive disorder, recurrent, unspecified: Secondary | ICD-10-CM

## 2023-12-05 DIAGNOSIS — E559 Vitamin D deficiency, unspecified: Secondary | ICD-10-CM

## 2023-12-05 DIAGNOSIS — Z7985 Long-term (current) use of injectable non-insulin antidiabetic drugs: Secondary | ICD-10-CM

## 2023-12-05 DIAGNOSIS — R4189 Other symptoms and signs involving cognitive functions and awareness: Secondary | ICD-10-CM

## 2023-12-05 DIAGNOSIS — Z79899 Other long term (current) drug therapy: Secondary | ICD-10-CM

## 2023-12-05 DIAGNOSIS — Z0001 Encounter for general adult medical examination with abnormal findings: Secondary | ICD-10-CM | POA: Diagnosis not present

## 2023-12-05 DIAGNOSIS — N182 Chronic kidney disease, stage 2 (mild): Secondary | ICD-10-CM

## 2023-12-05 DIAGNOSIS — F4329 Adjustment disorder with other symptoms: Secondary | ICD-10-CM

## 2023-12-05 DIAGNOSIS — G720 Drug-induced myopathy: Secondary | ICD-10-CM

## 2023-12-05 DIAGNOSIS — G4733 Obstructive sleep apnea (adult) (pediatric): Secondary | ICD-10-CM

## 2023-12-05 DIAGNOSIS — Z23 Encounter for immunization: Secondary | ICD-10-CM

## 2023-12-05 MED ORDER — TIRZEPATIDE 7.5 MG/0.5ML ~~LOC~~ SOAJ: 7.5000 mg | SUBCUTANEOUS | 3 refills | Status: DC

## 2023-12-05 MED ORDER — ZOSTER VAC RECOMB ADJUVANTED 50 MCG/0.5ML IM SUSR: 0.5000 mL | Freq: Once | INTRAMUSCULAR | 1 refills | Status: AC

## 2023-12-05 MED ORDER — AMLODIPINE BESYLATE 10 MG PO TABS: 10.0000 mg | ORAL_TABLET | Freq: Every day | ORAL | 3 refills | Status: AC

## 2023-12-05 MED ORDER — BUPROPION HCL ER (XL) 150 MG PO TB24: 150.0000 mg | ORAL_TABLET | Freq: Every day | ORAL | 3 refills | Status: AC

## 2023-12-05 MED ORDER — EZETIMIBE 10 MG PO TABS: 10.0000 mg | ORAL_TABLET | Freq: Every day | ORAL | 3 refills | Status: AC

## 2023-12-05 MED ORDER — COVID-19 MRNA VAC-TRIS(PFIZER) 30 MCG/0.3ML IM SUSY: 0.3000 mL | PREFILLED_SYRINGE | Freq: Once | INTRAMUSCULAR | 0 refills | Status: AC

## 2023-12-05 NOTE — Patient Instructions (Signed)
 Recommended vaccines: Shingrix (shingles)

## 2023-12-05 NOTE — Progress Notes (Signed)
 Complete physical exam   Patient: Cassidy Kelly   DOB: [birth year]   53 y.o. Female  MRN: [id number] Visit Date: 12/05/2023  Today's healthcare provider: LAURAINE LOISE BUOY, DO   Chief Complaint  Patient presents with   Annual Exam   Care Management    Pattern of eating:general   Are you exercising:yes  What type of exercising:walking   How long:63min  How frequent: couple times a week    Vaccine: denied all   Eye appt on 3/25 with Dr Sharlot at [facility] in [facility]       Subjective    Cassidy Kelly is a 53 y.o. female who presents today for a complete physical exam.   HPI HPI     Care Management    Additional comments: Pattern of eating:general   Are you exercising:yes  What type of exercising:walking   How long:32min  How frequent: couple times a week    Vaccine: denied all   Eye appt on 3/25 with Dr Sharlot at [facility] in [place]          Last edited by Thelbert Eulalio HERO, CMA on 12/05/2023  3:44 PM.      Cassidy Kelly is a 53 year old female who presents with concerns of brain fog and forgetfulness.  She experiences significant brain fog and forgetfulness, which she attributes to hormonal changes possibly related to menopause. 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 She also notes hair shedding, which she believes is related to hormonal changes.  She has been using a CPAP machine consistently and has lost approximately 20 pounds since her last visit. She attributes her weight loss to walking a couple of times a week and not eating after a certain time in the evening. She feels good physically but is concerned about her cognitive symptoms.  She has not been checking her blood sugars regularly but tries to maintain a balanced diet. She was previously on Mounjaro , which suppressed her appetite, and she has stopped taking  metformin  since starting Mounjaro . She reports no side effects from Mounjaro , which she takes at night.  She experiences motion sickness, particularly when in the passenger seat or back seat of a vehicle, and sometimes needs to eat something salty to alleviate symptoms. She has a history of carpal tunnel syndrome, which causes hand pain when typing or sleeping in certain positions, but no new numbness or tingling.   She takes Xyzal for allergies, which she finds effective after switching from Zyrtec.  She has not had any falls in the past year and had an eye exam on July 02, 2023. She has received a tetanus shot recently.  She has a history of motion sickness and uses scopolamine  patches occasionally, especially during cruises. She is currently on bupropion , losartan , and ezetimibe  for cholesterol, and she takes Mounjaro  injections weekly.   07/02/2023 - [facility] in [place] - Dr. 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   Hypercholesterolemia    Hypertension    Obesity (BMI 30-39.9)    Past Surgical History:  Procedure Laterality Date   COLONOSCOPY WITH PROPOFOL  N/A 09/30/2018   Procedure: COLONOSCOPY WITH PROPOFOL ;  Surgeon: Therisa Bi, MD;  Location: [facility] ENDOSCOPY;  Service: Gastroenterology;  Laterality: N/A;   WISDOM TOOTH EXTRACTION  AGE 65; ALL FOUR   XI ROBOTIC LAPAROSCOPIC ASSISTED APPENDECTOMY N/A 07/31/2022   Procedure: XI ROBOTIC LAPAROSCOPIC ASSISTED APPENDECTOMY;  Surgeon: Tye Millet, DO;  Location: [facility] ORS;  Service: General;  Laterality: N/A;   Social History   Socioeconomic History   Marital status: Divorced    Spouse name: Not on file   Number of children: 1   Years of education: 16   Highest education level: Master's degree (e.g., MA, MS, MEng, MEd, MSW, MBA)  Occupational History   Occupation: Pension scheme manager  Tobacco Use   Smoking status: Never   Smokeless tobacco: Never  Vaping Use    Vaping status: Never Used  Substance and Sexual Activity   Alcohol use: Yes    Comment: OCC   Drug use: No   Sexual activity: Yes    Partners: Male    Comment: DEPO  Other Topics Concern   Not on file  Social History Narrative   Not on file   Social Drivers of Health   Financial Resource Strain: Low Risk  (12/04/2023)   Overall Financial Resource Strain (CARDIA)    Difficulty of Paying Living Expenses: Not very hard  Food Insecurity: No Food Insecurity (12/04/2023)   Hunger Vital Sign    Worried About Running Out of Food in the Last Year: Never true    Ran Out of Food in the Last Year: Never true  Transportation Needs: No Transportation Needs (12/04/2023)   PRAPARE - Administrator, Civil Service (Medical): No    Lack of Transportation (Non-Medical): No  Physical Activity: Insufficiently Active (12/04/2023)   Exercise Vital Sign    Days of Exercise per Week: 2 days    Minutes of Exercise per Session: 30 min  Stress: Stress Concern Present (12/04/2023)   [employer] of Occupational Health - Occupational Stress Questionnaire    Feeling of Stress: To some extent  Social Connections: Moderately Integrated (12/04/2023)   Social Connection and Isolation Panel    Frequency of Communication with Friends and Family: More than three times a week    Frequency of Social Gatherings with Friends and Family: Once a week    Attends Religious Services: More than 4 times per year    Active Member of [employer] or Organizations: Yes    Attends Engineer, structural: More than 4 times per year    Marital Status: Divorced  Intimate Partner Violence: Patient Unable To Answer (07/31/2022)   Humiliation, Afraid, Rape, and Kick questionnaire    Fear of Current or Ex-Partner: Patient unable to answer    Emotionally Abused: Patient unable to answer    Physically Abused: Patient unable to answer    Sexually Abused: Patient unable to answer   Family Status  Relation Name Status   Mother   (Not Specified)   MGM  (Not Specified)   MGF  (Not Specified)   PGF  (Not Specified)  No partnership data on file   Family History  Problem Relation Age of Onset   Diabetes Mother    Diabetes type II Maternal Grandmother    Diabetes type II Maternal Grandfather    Heart disease Paternal Grandfather        MIs in 36s   Allergies  Allergen Reactions   Other     Cats   Pollen Extract     Patient Care Team: Sadie Pickar, Lauraine SAILOR, DO as PCP - General (Family Medicine)   Medications: Outpatient Medications Prior to Visit  Medication Sig  cholecalciferol (VITAMIN D3) 25 MCG (1000 UNIT) tablet Take 1,000 Units by mouth daily.   FLUoxetine  (PROZAC ) 10 MG capsule Take 1 capsule (10 mg total) by mouth daily.   levocetirizine (XYZAL) 5 MG tablet Take 5 mg by mouth every evening.   losartan -hydrochlorothiazide  (HYZAAR) 100-25 MG tablet Take 1 tablet by mouth daily.   scopolamine  (TRANSDERM-SCOP) 1 MG/3DAYS Place 1 patch (1.5 mg total) onto the skin every 3 (three) days.   [DISCONTINUED] amLODipine  (NORVASC ) 10 MG tablet Take 1 tablet (10 mg total) by mouth daily.   [DISCONTINUED] buPROPion  (WELLBUTRIN  XL) 150 MG 24 hr tablet Take 1 tablet (150 mg total) by mouth daily.   [DISCONTINUED] ezetimibe  (ZETIA ) 10 MG tablet Take 1 tablet (10 mg total) by mouth daily.   [DISCONTINUED] metFORMIN  (GLUCOPHAGE -XR) 750 MG 24 hr tablet Take 1 tablet (750 mg total) by mouth 2 (two) times daily before a meal.   [DISCONTINUED] Multiple Vitamin (MULTIVITAMIN) tablet Take 1 tablet by mouth daily.   [DISCONTINUED] tirzepatide  (MOUNJARO ) 7.5 MG/0.5ML Pen Inject 7.5 mg into the skin once a week.   [DISCONTINUED] TURMERIC PO Take by mouth. (Patient not taking: Reported on 07/27/2023)   No facility-administered medications prior to visit.    Review of Systems  Constitutional:  Negative for chills, fatigue and fever.  HENT:  Negative for congestion, ear pain, rhinorrhea, sneezing and sore throat.   Eyes: Negative.   Negative for pain and redness.  Respiratory:  Negative for cough, shortness of breath and wheezing.   Cardiovascular:  Negative for chest pain and leg swelling.  Gastrointestinal:  Negative for abdominal pain, blood in stool, constipation, diarrhea and nausea.  Endocrine: Negative for polydipsia and polyphagia.       +hair loss, brain fog  Genitourinary: Negative.  Negative for dysuria, flank pain, hematuria, pelvic pain, vaginal bleeding and vaginal discharge.  Musculoskeletal:  Negative for arthralgias, back pain, gait problem and joint swelling.  Skin:  Negative for rash.  Neurological:  Positive for dizziness (intermittent dizziness/motion sickness). Negative for tremors, seizures, weakness, light-headedness, numbness and headaches.  Hematological:  Negative for adenopathy.  Psychiatric/Behavioral: Negative.  Negative for behavioral problems, confusion and dysphoric mood. The patient is not nervous/anxious and is not hyperactive.       Objective    BP 120/85 (BP Location: Right Arm, Patient Position: Sitting, Cuff Size: Normal)   Pulse 60   Ht 5' 2 (1.575 m)   Wt 192 lb (87.1 kg)   LMP 08/01/2017   SpO2 100%   BMI 35.12 kg/m     Physical Exam Vitals and nursing note reviewed.  Constitutional:      General: She is awake.     Appearance: Normal appearance.  HENT:     Head: Normocephalic and atraumatic.     Right Ear: Tympanic membrane, ear canal and external ear normal.     Left Ear: Tympanic membrane, ear canal and external ear normal.     Nose: Nose normal.     Mouth/Throat:     Mouth: Mucous membranes are moist.     Pharynx: Oropharynx is clear. No oropharyngeal exudate or posterior oropharyngeal erythema.  Eyes:     General: No scleral icterus.    Extraocular Movements: Extraocular movements intact.     Conjunctiva/sclera: Conjunctivae normal.     Pupils: Pupils are equal, round, and reactive to light.  Neck:     Thyroid : No thyromegaly or thyroid  tenderness.   Cardiovascular:     Rate and Rhythm: Normal rate and regular  rhythm.     Pulses: Normal pulses.     Heart sounds: Normal heart sounds.  Pulmonary:     Effort: Pulmonary effort is normal. No tachypnea, bradypnea or respiratory distress.     Breath sounds: Normal breath sounds. No stridor. No wheezing, rhonchi or rales.  Abdominal:     General: Bowel sounds are normal. There is no distension.     Palpations: Abdomen is soft. There is no mass.     Tenderness: There is no abdominal tenderness. There is no guarding.     Hernia: No hernia is present.  Musculoskeletal:     Cervical back: Normal range of motion and neck supple.     Right lower leg: No edema.     Left lower leg: No edema.  Lymphadenopathy:     Cervical: No cervical adenopathy.  Skin:    General: Skin is warm and dry.  Neurological:     Mental Status: She is alert and oriented to person, place, and time. Mental status is at baseline.  Psychiatric:        Mood and Affect: Mood normal.        Behavior: Behavior normal.      Last depression screening scores    12/05/2023    3:45 PM 07/27/2023    3:28 PM 01/10/2023    3:36 PM  PHQ 2/9 Scores  PHQ - 2 Score 0 0 1  PHQ- 9 Score 6 5 9    Last fall risk screening    12/05/2023    4:02 PM  Fall Risk   Falls in the past year? 0  Number falls in past yr: 0  Injury with Fall? 0  Risk for fall due to : No Fall Risks   Last Audit-C alcohol use screening    12/04/2023   10:26 AM  Alcohol Use Disorder Test (AUDIT)  1. How often do you have a drink containing alcohol? 1  2. How many drinks containing alcohol do you have on a typical day when you are drinking? 0  3. How often do you have six or more drinks on one occasion? 0  AUDIT-C Score 1      Patient-reported   A score of 3 or more in women, and 4 or more in men indicates increased risk for alcohol abuse, EXCEPT if all of the points are from question 1   Results for orders placed or performed in visit on 12/05/23   Microalbumin / creatinine urine ratio  Result Value Ref Range   Creatinine, Urine 189.1 Not Estab. mg/dL   Microalbumin, Urine 6.0 Not Estab. ug/mL   Microalb/Creat Ratio 3 0 - 29 mg/g creat  Comprehensive metabolic panel with GFR  Result Value Ref Range   Glucose 80 70 - 99 mg/dL   BUN 12 6 - 24 mg/dL   Creatinine, Ser 8.84 (H) 0.57 - 1.00 mg/dL   eGFR 57 (L) >40 fO/fpw/8.26   BUN/Creatinine Ratio 10 9 - 23   Sodium 139 134 - 144 mmol/L   Potassium 3.4 (L) 3.5 - 5.2 mmol/L   Chloride 100 96 - 106 mmol/L   CO2 21 20 - 29 mmol/L   Calcium  9.0 8.7 - 10.2 mg/dL   Total Protein 7.2 6.0 - 8.5 g/dL   Albumin 4.6 3.8 - 4.9 g/dL   Globulin, Total 2.6 1.5 - 4.5 g/dL   Bilirubin Total 0.3 0.0 - 1.2 mg/dL   Alkaline Phosphatase 93 44 - 121 IU/L   AST 18 0 -  40 IU/L   ALT 14 0 - 32 IU/L  Hemoglobin A1c  Result Value Ref Range   Hgb A1c MFr Bld 5.6 4.8 - 5.6 %   Est. average glucose Bld gHb Est-mCnc 114 mg/dL  Lipid panel  Result Value Ref Range   Cholesterol, Total 215 (H) 100 - 199 mg/dL   Triglycerides 823 (H) 0 - 149 mg/dL   HDL 49 >60 mg/dL   VLDL Cholesterol Cal 31 5 - 40 mg/dL   LDL Chol Calc (NIH) 864 (H) 0 - 99 mg/dL   Chol/HDL Ratio 4.4 0.0 - 4.4 ratio  VITAMIN D  25 Hydroxy (Vit-D Deficiency, Fractures)  Result Value Ref Range   Vit D, 25-Hydroxy 52.2 30.0 - 100.0 ng/mL    Assessment & Plan    Routine Health Maintenance and Physical Exam  Exercise Activities and Dietary recommendations  Goals      Manage My Emotions     Timeframe:  Long-Range Goal Priority:  Medium Start Date:     08/04/21                        Expected End Date:    09/22/21                Follow Up Date 09/22/21   - begin personal counseling - start or continue a personal journal - talk about feelings with a friend, family or spiritual advisor - practice positive thinking and self-talk    Why is this important?   When you are stressed, down or upset, your body reacts too.  For example, your  blood pressure may get higher; you may have a headache or stomachache.  When your emotions get the best of you, your body's ability to fight off cold and flu gets weak.  These steps will help you manage your emotions.     Notes:         Immunization History  Administered Date(s) Administered   PFIZER(Purple Top)SARS-COV-2 Vaccination 05/25/2019, 06/17/2019, 03/22/2020, 07/17/2020   Td 09/20/2005, 06/01/2010   Tdap 09/20/2005, 06/01/2010, 11/27/2023    Health Maintenance  Topic Date Due   OPHTHALMOLOGY EXAM  Never done   COVID-19 Vaccine (5 - 2025-26 season) 11/26/2023   Influenza Vaccine  06/24/2024 (Originally 10/26/2023)   Zoster Vaccines- Shingrix (1 of 2) 11/25/2024 (Originally [birth year]/19/1988)   Pneumococcal Vaccine: 50+ Years (1 of 2 - PCV) 12/04/2024 (Originally [birth year]/19/1988)   Hepatitis B Vaccines 19-59 Average Risk (1 of 3 - 19+ 3-dose series) 12/04/2024 (Originally [birth year]19/1988)   Mammogram  05/31/2024   HEMOGLOBIN A1C  06/03/2024   Diabetic kidney evaluation - eGFR measurement  12/04/2024   Diabetic kidney evaluation - Urine ACR  12/04/2024   FOOT EXAM  12/04/2024   Cervical Cancer Screening (HPV/Pap Cotest)  10/30/2027   Colonoscopy  09/29/2028   DTaP/Tdap/Td (6 - Td or Tdap) 11/26/2033   Hepatitis C Screening  Completed   HIV Screening  Completed   HPV VACCINES  Aged Out   Meningococcal B Vaccine  Aged Out    Discussed health benefits of physical activity, and encouraged her to engage in regular exercise appropriate for her age and condition.   Annual physical exam  Hypercholesterolemia -     Ezetimibe ; Take 1 tablet (10 mg total) by mouth daily.  Dispense: 90 tablet; Refill: 3  Essential hypertension -     amLODIPine  Besylate; Take 1 tablet (10 mg total) by mouth daily.  Dispense: 90 tablet; Refill: 3  Type 2 diabetes mellitus with stage 2 chronic kidney disease, without long-term current use of insulin (HCC) -     Microalbumin / creatinine urine ratio -      Comprehensive metabolic panel with GFR -     Hemoglobin A1c -     Lipid panel -     Vitamin B12 -     Tirzepatide ; Inject 7.5 mg into the skin once a week.  Dispense: 2 mL; Refill: 3  Statin myopathy  Severe obstructive sleep apnea  Vitamin D  deficiency -     VITAMIN D  25 Hydroxy (Vit-D Deficiency, Fractures)  Brain fog -     Vitamin B12  High risk medication use -     Vitamin B12  Need for shingles vaccine -     Zoster Vac Recomb Adjuvanted; Inject 0.5 mLs into the muscle once for 1 dose. May receive 2nd vaccine 2 to 6 months after the first vaccine.  Dispense: 0.5 mL; Refill: 1  Depression, recurrent -     buPROPion  HCl ER (XL); Take 1 tablet (150 mg total) by mouth daily.  Dispense: 90 tablet; Refill: 3  Stress and adjustment reaction -     buPROPion  HCl ER (XL); Take 1 tablet (150 mg total) by mouth daily.  Dispense: 90 tablet; Refill: 3  Other orders -     COVID-19 mRNA Vac-TriS(Pfizer); Inject 0.3 mLs into the muscle once for 1 dose.  Dispense: 0.3 mL; Refill: 0     Annual physical exam Routine visit with 20-pound weight loss. Physical exam overall unremarkable except as noted above. Routine lab work ordered as noted.  Using CPAP and deriving benefit. Declined flu and pneumonia vaccines. Agreed to shingles vaccine later. - Perform foot exam. - Request eye exam records from Dr. Sharlot at [facility] in [place]. - Send prescription for [employer] booster to [facility] on [address]. - Administer shingles vaccine at [facility]  Type 2 diabetes mellitus with stage 2 chronic kidney disease, without long-term current use of insulin Blood sugars not regularly checked. Last A1c was good. Stopped metformin  for Mounjaro  7.5 mg, well-tolerated. - Continue Mounjaro  7.5 mg. - Order A1c test.  Essential hypertension Chronic, stable.  Blood pressure 120/85 mmHg. Diastolic slightly elevated. Weight loss and activity expected to improve control. - Continue current antihypertensive  regimen. - Encourage continued weight loss and physical activity.  Hypercholesterolemia; statin myopathy Ezetimibe  used for cholesterol management due to statin intolerance. - Refill ezetimibe  prescription.  Obstructive sleep apnea Using CPAP machine as prescribed and deriving benefit from its use.  Allergic rhinitis Symptoms not controlled by Zyrtec. Switched to Xyzal, providing relief. Discussed Allegra as alternative. - Continue Xyzal for allergic rhinitis. - Consider Allegra if Xyzal becomes ineffective.  Cognitive symptoms (brain fog, forgetfulness) Symptoms possibly related to menopause. Manageable, not severe.  Will evaluate for metabolic sources. - Order vitamin B12 test.  Depression Chronic, stable.  Bupropion  prescribed. No new symptoms.  No changes - Refill bupropion  prescription.  Motion sickness Managed by scopolamine  patches as needed. - Continue scopolamine  patches as needed for motion sickness.     Return in about 6 months (around 06/03/2024) for DM, Chronic f/u.     I discussed the assessment and treatment plan with the patient  The patient was provided an opportunity to ask questions and all were answered. The patient agreed with the plan and demonstrated an understanding of the instructions.   The patient was advised to call back or seek an in-person evaluation if the symptoms  worsen or if the condition fails to improve as anticipated.    LAURAINE LOISE BUOY, DO  [facility] [phone] (phone) [phone] (fax)  [facility]

## 2023-12-06 LAB — LIPID PANEL
Chol/HDL Ratio: 4.4 ratio (ref 0.0–4.4)
Cholesterol, Total: 215 mg/dL — ABNORMAL HIGH (ref 100–199)
HDL: 49 mg/dL (ref >39)
LDL Chol Calc (NIH): 135 mg/dL — ABNORMAL HIGH (ref 0–99)
Triglycerides: 176 mg/dL — ABNORMAL HIGH (ref 0–149)
VLDL Cholesterol Cal: 31 mg/dL (ref 5–40)

## 2023-12-06 LAB — COMPREHENSIVE METABOLIC PANEL WITH GFR
ALT: 14 IU/L (ref 0–32)
AST: 18 IU/L (ref 0–40)
Albumin: 4.6 g/dL (ref 3.8–4.9)
Alkaline Phosphatase: 93 IU/L (ref 44–121)
BUN/Creatinine Ratio: 10 (ref 9–23)
BUN: 12 mg/dL (ref 6–24)
Bilirubin Total: 0.3 mg/dL (ref 0.0–1.2)
CO2: 21 mmol/L (ref 20–29)
Calcium: 9 mg/dL (ref 8.7–10.2)
Chloride: 100 mmol/L (ref 96–106)
Creatinine, Ser: 1.15 mg/dL — ABNORMAL HIGH (ref 0.57–1.00)
Globulin, Total: 2.6 g/dL (ref 1.5–4.5)
Glucose: 80 mg/dL (ref 70–99)
Potassium: 3.4 mmol/L — ABNORMAL LOW (ref 3.5–5.2)
Sodium: 139 mmol/L (ref 134–144)
Total Protein: 7.2 g/dL (ref 6.0–8.5)
eGFR: 57 mL/min/1.73 — ABNORMAL LOW (ref >59)

## 2023-12-06 LAB — MICROALBUMIN / CREATININE URINE RATIO
Creatinine, Urine: 189.1 mg/dL
Microalb/Creat Ratio: 3 mg/g{creat} (ref 0–29)
Microalbumin, Urine: 6 ug/mL

## 2023-12-06 LAB — HEMOGLOBIN A1C
Est. average glucose Bld gHb Est-mCnc: 114 mg/dL
Hgb A1c MFr Bld: 5.6 % (ref 4.8–5.6)

## 2023-12-06 LAB — VITAMIN D 25 HYDROXY (VIT D DEFICIENCY, FRACTURES): Vit D, 25-Hydroxy: 52.2 ng/mL (ref 30.0–100.0)

## 2023-12-19 ENCOUNTER — Ambulatory Visit: Payer: Self-pay | Admitting: Family Medicine

## 2023-12-19 DIAGNOSIS — E7800 Pure hypercholesterolemia, unspecified: Secondary | ICD-10-CM

## 2023-12-19 MED ORDER — BEMPEDOIC ACID 180 MG PO TABS
1.0000 | ORAL_TABLET | Freq: Every day | ORAL | 3 refills | Status: AC
Start: 2023-12-19 — End: ?

## 2023-12-24 ENCOUNTER — Other Ambulatory Visit (HOSPITAL_COMMUNITY): Payer: Self-pay

## 2023-12-24 ENCOUNTER — Telehealth: Payer: Self-pay

## 2023-12-24 NOTE — Telephone Encounter (Signed)
 Pharmacy Patient Advocate Encounter   Received notification from Onbase that prior authorization for Nexletol  180MG  tablets is required/requested.   Insurance verification completed.   The patient is insured through [facility] ADVANTAGE/RX ADVANCE .   Per test claim: PA required; PA submitted to above mentioned insurance via Latent Key/confirmation #/EOC [id number] Status is pending

## 2023-12-25 ENCOUNTER — Other Ambulatory Visit (HOSPITAL_COMMUNITY): Payer: Self-pay

## 2023-12-25 NOTE — Telephone Encounter (Signed)
 Pharmacy Patient Advocate Encounter  Received notification from [facility] that Prior Authorization for Nexletol  180MG  tablets has been APPROVED from 12/24/2023 to 12/24/2026. Ran test claim, Copay is $10.00. This test claim was processed through [facility]- copay amounts may vary at other pharmacies due to pharmacy/plan contracts, or as the patient moves through the different stages of their insurance plan.   PA #/Case ID/Reference #: [id number]

## 2024-02-20 ENCOUNTER — Other Ambulatory Visit: Payer: Self-pay | Admitting: Family Medicine

## 2024-02-20 DIAGNOSIS — I10 Essential (primary) hypertension: Secondary | ICD-10-CM

## 2024-02-26 ENCOUNTER — Encounter: Payer: Self-pay | Admitting: Family Medicine

## 2024-02-27 ENCOUNTER — Other Ambulatory Visit: Payer: Self-pay

## 2024-02-27 DIAGNOSIS — E1122 Type 2 diabetes mellitus with diabetic chronic kidney disease: Secondary | ICD-10-CM

## 2024-02-27 MED ORDER — TIRZEPATIDE 7.5 MG/0.5ML ~~LOC~~ SOAJ: 7.5000 mg | SUBCUTANEOUS | 0 refills | Status: DC

## 2024-02-29 ENCOUNTER — Other Ambulatory Visit: Payer: Self-pay | Admitting: Family Medicine

## 2024-03-11 ENCOUNTER — Telehealth: Payer: Self-pay | Admitting: Family Medicine

## 2024-03-11 DIAGNOSIS — N182 Chronic kidney disease, stage 2 (mild): Secondary | ICD-10-CM

## 2024-03-11 DIAGNOSIS — G4733 Obstructive sleep apnea (adult) (pediatric): Secondary | ICD-10-CM

## 2024-03-11 NOTE — Telephone Encounter (Signed)
Patient requesting increased dosage.

## 2024-03-11 NOTE — Telephone Encounter (Unsigned)
 Copied from CRM #[id number]. Topic: Clinical - Medication Refill >> Mar 11, 2024  9:03 AM Tobias L wrote: Medication: tirzepatide  (MOUNJARO ) pen  Patient requesting increase in dosage, requesting to be increased to 10mg . Patient picked up the 7.5mg  dosage yesterday and has three more, patient wants to ensure next refill will be increased in dosage.   Has the patient contacted their pharmacy? No   This is the patient's preferred pharmacy:  [id number] - [place], [place] - [address] AT [facility] [address] [facility] [facility] [address] [place] [address] Phone: [phone] Fax: [phone]   Is this the correct pharmacy for this prescription? Yes  Has the prescription been filled recently? Yes  Is the patient out of the medication? No  Has the patient been seen for an appointment in the last year OR does the patient have an upcoming appointment? Yes  Can we respond through MyChart? Yes  Agent: Please be advised that Rx refills may take up to 3 business days. We ask that you follow-up with your pharmacy.

## 2024-03-17 MED ORDER — TIRZEPATIDE 10 MG/0.5ML ~~LOC~~ SOAJ: 10.0000 mg | SUBCUTANEOUS | 1 refills | Status: DC

## 2024-04-06 ENCOUNTER — Encounter: Payer: Self-pay | Admitting: Family Medicine

## 2024-04-06 DIAGNOSIS — E1122 Type 2 diabetes mellitus with diabetic chronic kidney disease: Secondary | ICD-10-CM

## 2024-04-06 DIAGNOSIS — G4733 Obstructive sleep apnea (adult) (pediatric): Secondary | ICD-10-CM

## 2024-04-08 MED ORDER — TIRZEPATIDE 10 MG/0.5ML ~~LOC~~ SOAJ: 10.0000 mg | SUBCUTANEOUS | 1 refills | Status: AC

## 2024-06-03 ENCOUNTER — Ambulatory Visit: Admitting: Family Medicine
